# Patient Record
Sex: Female | Born: 1950 | Hispanic: Refuse to answer | Marital: Married | Smoking: Former smoker
Health system: Southern US, Community
[De-identification: ages and names within clinical notes are randomized; demographics above are authoritative.]

## PROBLEM LIST (undated history)

## (undated) DIAGNOSIS — I10 Essential (primary) hypertension: Secondary | ICD-10-CM

## (undated) DIAGNOSIS — Z9889 Other specified postprocedural states: Secondary | ICD-10-CM

## (undated) DIAGNOSIS — M1990 Unspecified osteoarthritis, unspecified site: Secondary | ICD-10-CM

## (undated) DIAGNOSIS — R112 Nausea with vomiting, unspecified: Secondary | ICD-10-CM

## (undated) DIAGNOSIS — Z9221 Personal history of antineoplastic chemotherapy: Secondary | ICD-10-CM

## (undated) DIAGNOSIS — Z803 Family history of malignant neoplasm of breast: Secondary | ICD-10-CM

## (undated) HISTORY — DX: Family history of malignant neoplasm of breast: Z80.3

## (undated) HISTORY — PX: APPENDECTOMY: SHX54

## (undated) HISTORY — DX: Unspecified osteoarthritis, unspecified site: M19.90

## (undated) HISTORY — PX: MASTECTOMY: SHX3

## (undated) HISTORY — PX: TONSILLECTOMY: SUR1361

---

## 2019-05-06 ENCOUNTER — Other Ambulatory Visit: Payer: Self-pay | Admitting: Family Medicine

## 2019-05-06 DIAGNOSIS — N6310 Unspecified lump in the right breast, unspecified quadrant: Secondary | ICD-10-CM

## 2019-05-15 ENCOUNTER — Other Ambulatory Visit: Payer: Self-pay

## 2019-05-15 ENCOUNTER — Ambulatory Visit
Admission: RE | Admit: 2019-05-15 | Discharge: 2019-05-15 | Disposition: A | Discharge status: Home / Self Care | Payer: Medicare Other | Source: Ambulatory Visit | Attending: Family Medicine | Admitting: Family Medicine

## 2019-05-15 ENCOUNTER — Other Ambulatory Visit: Payer: Self-pay | Admitting: Family Medicine

## 2019-05-15 DIAGNOSIS — N6310 Unspecified lump in the right breast, unspecified quadrant: Secondary | ICD-10-CM

## 2019-05-15 HISTORY — DX: Personal history of antineoplastic chemotherapy: Z92.21

## 2019-05-19 ENCOUNTER — Ambulatory Visit
Admission: RE | Admit: 2019-05-19 | Discharge: 2019-05-19 | Disposition: A | Discharge status: Home / Self Care | Payer: Medicare Other | Source: Ambulatory Visit | Attending: Family Medicine | Admitting: Family Medicine

## 2019-05-19 ENCOUNTER — Other Ambulatory Visit: Payer: Self-pay

## 2019-05-19 DIAGNOSIS — N6310 Unspecified lump in the right breast, unspecified quadrant: Secondary | ICD-10-CM

## 2019-05-20 ENCOUNTER — Encounter: Payer: Self-pay | Admitting: *Deleted

## 2019-05-21 ENCOUNTER — Telehealth: Payer: Self-pay | Admitting: Hematology and Oncology

## 2019-05-21 NOTE — Telephone Encounter (Signed)
Spoke with patient to confirm afternoon [facility] appointment for 7/1, packet emailed to patient

## 2019-05-22 ENCOUNTER — Encounter: Payer: Self-pay | Admitting: *Deleted

## 2019-05-22 ENCOUNTER — Other Ambulatory Visit: Payer: Self-pay | Admitting: *Deleted

## 2019-05-22 DIAGNOSIS — Z170 Estrogen receptor positive status [ER+]: Secondary | ICD-10-CM | POA: Insufficient documentation

## 2019-05-22 DIAGNOSIS — C50411 Malignant neoplasm of upper-outer quadrant of right female breast: Secondary | ICD-10-CM | POA: Insufficient documentation

## 2019-05-26 NOTE — Progress Notes (Signed)
[place] NOTE  Patient Care Team: Earney Mallet, MD as PCP - General (Family Medicine) [name], RN as Oncology Nurse Navigator Rockwell Germany, RN as Oncology Nurse Navigator [name], MD as Consulting Physician (Hematology and Oncology) Autumn Messing III, MD as Consulting Physician (General Surgery) [name], MD as Attending Physician (Radiation Oncology)  CHIEF COMPLAINTS/PURPOSE OF CONSULTATION:  Newly diagnosed breast cancer  HISTORY OF PRESENTING ILLNESS:  Marisa Spears 68 y.o. female is here because of recent diagnosis of invasive ductal carcinoma of the right breast. The cancer was detected on a diagnostic mammogram after the patient palpated a lump in the UOQ of the right breast. It showed a 2.2cm mass in th right breast and there was no evidence of right axillary adenopathy. Biopsy on 05/19/19 showed grade 3 invasive ductal carcinoma, HER2 positive by FISH, ER 75% weak staining intensity, PR negative, Ki67 80%. She has a history of left breast cancer 28 years ago treated with mastectomy. She was recently being followed for likely benign calcifications in the right breast. She presents to the clinic today for initial evaluation and discussion of treatment options.   I reviewed her records extensively and collaborated the history with the patient.  SUMMARY OF ONCOLOGIC HISTORY: Oncology History  Malignant neoplasm of upper-outer quadrant of right breast in female, estrogen receptor positive ([place])  1992 Miscellaneous   Left mastectomy followed by adjuvant chemotherapy   05/22/2019 Initial Diagnosis   Patient palpated a lump in the right breast. Mammogram showed 2.2cm mass in the UOQ, with no right axillary adenopathy. 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    05/27/2019 Cancer Staging   Staging form: Breast, AJCC 8th Edition - Clinical stage from 05/27/2019: Stage IIA (cT2, cN0, cM0,  G3, ER+, PR-, HER2+) - Signed by [name], MD on 05/27/2019     MEDICAL HISTORY:  Past Medical History:  Diagnosis Date  . Arthritis   . Personal history of chemotherapy     SURGICAL HISTORY: Left mastectomy  SOCIAL HISTORY: Social History   Socioeconomic History  . Marital status: Married    Spouse name: Not on file  . Number of children: Not on file  . Years of education: Not on file  . Highest education level: Not on file  Occupational History  . Not on file  Social Needs  . Financial resource strain: Not on file  . Food insecurity    Worry: Not on file    Inability: Not on file  . Transportation needs    Medical: Not on file    Non-medical: Not on file  Tobacco Use  . Smoking status: Former Smoker    Types: Cigarettes  . Smokeless tobacco: Never Used  Substance and Sexual Activity  . Alcohol use: Not Currently  . Drug use: Not Currently  . Sexual activity: Not on file  Lifestyle  . Physical activity    Days per week: Not on file    Minutes per session: Not on file  . Stress: Not on file  Relationships  . Social Herbalist on phone: Not on file    Gets together: Not on file    Attends religious service: Not on file    Active member of club or organization: Not on file    Attends meetings of clubs or organizations: Not on file    Relationship status: Not on file  . Intimate partner  violence    Fear of current or ex partner: Not on file    Emotionally abused: Not on file    Physically abused: Not on file    Forced sexual activity: Not on file  Other Topics Concern  . Not on file  Social History Narrative  . Not on file    FAMILY HISTORY: Sister had breast cancer ALLERGIES:  is allergic to codeine.  MEDICATIONS:  Current Outpatient Medications  Medication Sig Dispense Refill  . aspirin EC 81 MG tablet Take 81 mg by mouth daily.    . Calcium Carbonate (CALCIUM 600 PO) Take 600 mcg by mouth daily.    . Omega-3 Fatty Acids (FISH OIL  OMEGA-3 PO) Take by mouth 2 (two) times a day.    . pravastatin (PRAVACHOL) 40 MG tablet Take 40 mg by mouth daily.    Marland Kitchen triamterene-hydrochlorothiazide (DYAZIDE) 37.5-25 MG capsule Take 1 capsule by mouth daily.    Marland Kitchen UNABLE TO FIND Take 2,500 mcg by mouth 2 (two) times a day. Hair, Skin, and Nail Gummies    . VITAMIN D PO Take 50 mcg by mouth daily. 2 tablets once daily     No current facility-administered medications for this visit.     REVIEW OF SYSTEMS:   Constitutional: Denies fevers, chills or abnormal night sweats Eyes: Denies blurriness of vision, double vision or watery eyes Ears, nose, mouth, throat, and face: Denies mucositis or sore throat Respiratory: Denies cough, dyspnea or wheezes Cardiovascular: Denies palpitation, chest discomfort or lower extremity swelling Gastrointestinal:  Denies nausea, heartburn or change in bowel habits Skin: Denies abnormal skin rashes Lymphatics: Denies new lymphadenopathy or easy bruising Neurological:Denies numbness, tingling or new weaknesses Behavioral/Psych: Mood is stable, no new changes  Breast: Denies any palpable lumps or discharge All other systems were reviewed with the patient and are negative.  PHYSICAL EXAMINATION: ECOG PERFORMANCE STATUS: 1 - Symptomatic but completely ambulatory  Vitals:   05/27/19 1306  BP: 125/72  Pulse: (!) 56  Resp: 18  Temp: 98.9 F (37.2 C)  SpO2: 100%   There were no vitals filed for this visit.  GENERAL:alert, no distress and comfortable SKIN: skin color, texture, turgor are normal, no rashes or significant lesions EYES: normal, conjunctiva are pink and non-injected, sclera clear OROPHARYNX:no exudate, no erythema and lips, buccal mucosa, and tongue normal  NECK: supple, thyroid normal size, non-tender, without nodularity LYMPH:  no palpable lymphadenopathy in the cervical, axillary or inguinal LUNGS: clear to auscultation and percussion with normal breathing effort HEART: regular rate &  rhythm and no murmurs and no lower extremity edema ABDOMEN:abdomen soft, non-tender and normal bowel sounds Musculoskeletal:no cyanosis of digits and no clubbing  PSYCH: alert & oriented x 3 with fluent speech NEURO: no focal motor/sensory deficits BREAST: Palpable nodule in the right breast, left mastectomy. No palpable axillary or supraclavicular lymphadenopathy (exam performed in the presence of a chaperone)   LABORATORY DATA:  I have reviewed the data as listed Lab Results  Component Value Date   WBC 6.8 05/27/2019   HGB 13.2 05/27/2019   HCT 38.5 05/27/2019   MCV 91.0 05/27/2019   PLT 245 05/27/2019   Lab Results  Component Value Date   NA 138 05/27/2019   K 3.8 05/27/2019   CL 105 05/27/2019   CO2 24 05/27/2019    RADIOGRAPHIC STUDIES: I have personally reviewed the radiological reports and agreed with the findings in the report.  ASSESSMENT AND PLAN:  Malignant neoplasm of upper-outer quadrant of  right breast in female, estrogen receptor positive (Marisa Spears) 05/22/2019:Patient palpated a lump in the right breast. Mammogram showed 2.2cm mass in the UOQ, with no right axillary adenopathy. 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  Stage IIa  Pathology and radiology counseling: Discussed with the patient, the details of pathology including the type of breast cancer,the clinical staging, the significance of ER, PR and HER-2/neu receptors and the implications for treatment. After reviewing the pathology in detail, we proceeded to discuss the different treatment options between surgery, radiation, chemotherapy, antiestrogen therapies.  Treatment plan: 1.  Surgery with right mastectomy/lumpectomy and sentinel lymph node biopsy 2.  Adjuvant chemotherapy with [place] followed by Herceptin Perjeta maintenance (we also discussed neoadjuvant chemotherapy risks and benefits) Adjuvant radiation if she undergoes lumpectomy 3.   Adjuvant antiestrogen therapy with anastrozole 1 mg daily x7 years  Chemotherapy Counseling: I discussed the risks and benefits of chemotherapy including the risks of nausea/ vomiting, risk of infection from low WBC count, fatigue due to chemo or anemia, bruising or bleeding due to low platelets, mouth sores, loss/ change in taste and decreased appetite. Liver and kidney function will be monitored through out chemotherapy as abnormalities in liver and kidney function may be a side effect of treatment. Cardiac dysfunction due to Herceptin and Perjeta were discussed in detail. Risk of permanent bone marrow dysfunction and leukemia due to chemo were also discussed.  Genetic counseling will also be requested  Return to clinic after surgery to discuss pathology report with Doximity video call If she decides on doing neoadjuvant chemo then we will need port placement MRI breast and echocardiogram to be done immediately.   All questions were answered. The patient knows to call the clinic with any problems, questions or concerns.   Rulon Eisenmenger, MD 05/27/2019    I, Molly Dorshimer, am acting as scribe for [name], MD.  I have reviewed the above documentation for accuracy and completeness, and I agree with the above.

## 2019-05-27 ENCOUNTER — Encounter: Payer: Self-pay | Admitting: Radiation Oncology

## 2019-05-27 ENCOUNTER — Other Ambulatory Visit: Payer: Self-pay

## 2019-05-27 ENCOUNTER — Other Ambulatory Visit: Payer: Self-pay | Admitting: *Deleted

## 2019-05-27 ENCOUNTER — Inpatient Hospital Stay: Payer: Medicare Other

## 2019-05-27 ENCOUNTER — Ambulatory Visit
Admission: RE | Admit: 2019-05-27 | Discharge: 2019-05-27 | Disposition: A | Discharge status: Home / Self Care | Payer: Medicare Other | Source: Ambulatory Visit | Attending: Radiation Oncology | Admitting: Radiation Oncology

## 2019-05-27 ENCOUNTER — Inpatient Hospital Stay: Payer: Medicare Other | Attending: Hematology and Oncology | Admitting: Hematology and Oncology

## 2019-05-27 ENCOUNTER — Ambulatory Visit: Payer: Medicare Other | Attending: General Surgery | Admitting: Physical Therapy

## 2019-05-27 ENCOUNTER — Encounter: Payer: Self-pay | Admitting: Physical Therapy

## 2019-05-27 ENCOUNTER — Ambulatory Visit (HOSPITAL_COMMUNITY)
Admission: RE | Admit: 2019-05-27 | Discharge: 2019-05-27 | Disposition: A | Discharge status: Home / Self Care | Payer: Medicare Other | Source: Ambulatory Visit | Attending: General Surgery | Admitting: General Surgery

## 2019-05-27 ENCOUNTER — Encounter: Payer: Self-pay | Admitting: Licensed Clinical Social Worker

## 2019-05-27 ENCOUNTER — Encounter: Payer: Self-pay | Admitting: Hematology and Oncology

## 2019-05-27 ENCOUNTER — Ambulatory Visit: Payer: Medicare Other | Admitting: Licensed Clinical Social Worker

## 2019-05-27 DIAGNOSIS — Z95828 Presence of other vascular implants and grafts: Secondary | ICD-10-CM

## 2019-05-27 DIAGNOSIS — Z9012 Acquired absence of left breast and nipple: Secondary | ICD-10-CM | POA: Insufficient documentation

## 2019-05-27 DIAGNOSIS — Z79899 Other long term (current) drug therapy: Secondary | ICD-10-CM | POA: Diagnosis not present

## 2019-05-27 DIAGNOSIS — M1990 Unspecified osteoarthritis, unspecified site: Secondary | ICD-10-CM | POA: Diagnosis not present

## 2019-05-27 DIAGNOSIS — Z170 Estrogen receptor positive status [ER+]: Secondary | ICD-10-CM

## 2019-05-27 DIAGNOSIS — C50411 Malignant neoplasm of upper-outer quadrant of right female breast: Secondary | ICD-10-CM

## 2019-05-27 DIAGNOSIS — Z87891 Personal history of nicotine dependence: Secondary | ICD-10-CM | POA: Insufficient documentation

## 2019-05-27 DIAGNOSIS — Z853 Personal history of malignant neoplasm of breast: Secondary | ICD-10-CM | POA: Diagnosis not present

## 2019-05-27 DIAGNOSIS — Z885 Allergy status to narcotic agent status: Secondary | ICD-10-CM | POA: Diagnosis not present

## 2019-05-27 DIAGNOSIS — R531 Weakness: Secondary | ICD-10-CM | POA: Insufficient documentation

## 2019-05-27 DIAGNOSIS — Z803 Family history of malignant neoplasm of breast: Secondary | ICD-10-CM

## 2019-05-27 DIAGNOSIS — Z9189 Other specified personal risk factors, not elsewhere classified: Secondary | ICD-10-CM | POA: Insufficient documentation

## 2019-05-27 DIAGNOSIS — R293 Abnormal posture: Secondary | ICD-10-CM | POA: Diagnosis present

## 2019-05-27 LAB — CBC WITH DIFFERENTIAL (CANCER CENTER ONLY)
Abs Immature Granulocytes: 0.02 10*3/uL (ref 0.00–0.07)
Basophils Absolute: 0 10*3/uL (ref 0.0–0.1)
Basophils Relative: 1 %
Eosinophils Absolute: 0.1 10*3/uL (ref 0.0–0.5)
Eosinophils Relative: 2 %
HCT: 38.5 % (ref 36.0–46.0)
Hemoglobin: 13.2 g/dL (ref 12.0–15.0)
Immature Granulocytes: 0 %
Lymphocytes Relative: 23 %
Lymphs Abs: 1.6 10*3/uL (ref 0.7–4.0)
MCH: 31.2 pg (ref 26.0–34.0)
MCHC: 34.3 g/dL (ref 30.0–36.0)
MCV: 91 fL (ref 80.0–100.0)
Monocytes Absolute: 0.4 10*3/uL (ref 0.1–1.0)
Monocytes Relative: 6 %
Neutro Abs: 4.6 10*3/uL (ref 1.7–7.7)
Neutrophils Relative %: 68 %
Platelet Count: 245 10*3/uL (ref 150–400)
RBC: 4.23 MIL/uL (ref 3.87–5.11)
RDW: 12.8 % (ref 11.5–15.5)
WBC Count: 6.8 10*3/uL (ref 4.0–10.5)
nRBC: 0 % (ref 0.0–0.2)

## 2019-05-27 LAB — CMP (CANCER CENTER ONLY)
ALT: 11 U/L (ref 0–44)
AST: 15 U/L (ref 15–41)
Albumin: 4.1 g/dL (ref 3.5–5.0)
Alkaline Phosphatase: 64 U/L (ref 38–126)
Anion gap: 9 (ref 5–15)
BUN: 13 mg/dL (ref 8–23)
CO2: 24 mmol/L (ref 22–32)
Calcium: 9.6 mg/dL (ref 8.9–10.3)
Chloride: 105 mmol/L (ref 98–111)
Creatinine: 0.89 mg/dL (ref 0.44–1.00)
GFR, Est AFR Am: >60 mL/min (ref >60)
GFR, Estimated: >60 mL/min (ref >60)
Glucose, Bld: 123 mg/dL — ABNORMAL HIGH (ref 70–99)
Potassium: 3.8 mmol/L (ref 3.5–5.1)
Sodium: 138 mmol/L (ref 135–145)
Total Bilirubin: 1 mg/dL (ref 0.3–1.2)
Total Protein: 7.3 g/dL (ref 6.5–8.1)

## 2019-05-27 NOTE — Therapy (Signed)
[place], [place], [address] Phone: [phone]   Fax:  [phone]  Physical Therapy Evaluation  Patient Details  Name: Marisa Spears MRN: [id number] Date of Birth: 1951/08/03 Referring Provider (PT): Dr. Autumn Messing   Encounter Date: 05/27/2019  PT End of Session - 05/27/19 1732    Visit Number  1    Number of Visits  2    Date for PT Re-Evaluation  07/22/19    PT Start Time  1430    PT Stop Time  1453    PT Time Calculation (min)  23 min    Activity Tolerance  Patient tolerated treatment well    Behavior During Therapy  [facility] for tasks assessed/performed       Past Medical History:  Diagnosis Date  . Arthritis   . Family history of breast cancer   . Personal history of chemotherapy     Past Surgical History:  Procedure Laterality Date  . APPENDECTOMY    . MASTECTOMY Left   . TONSILLECTOMY      There were no vitals filed for this visit.   Subjective Assessment - 05/27/19 1721    Subjective  Patient reports she is here today to be seen by her medical team for her newly diagnosed right breast cancer.    Pertinent History  Patient was diagnosed on 05/15/2019 with right invasive ductal carcinoma breast cancer. It measures 2.2 cm and is located in the upper outer quadrant. It is ER positive, PR negative, and HER2 positive with a Ki67 of 80%. She has a history of a left mastectomy and axillary node dissection (33 nodes removed) in 1992 followed by chemotherapy.    Limitations  Sitting    Patient Stated Goals  Reduce lymphedema risk and learn post op shoulder ROM HEP    Currently in Pain?  Yes    Pain Score  4     Pain Location  Other (Comment)   Thumbs   Pain Orientation  Right;Left    Pain Descriptors / Indicators  Aching    Pain Type  Chronic pain    Pain Onset  More than a month ago    Pain Frequency  Intermittent    Aggravating Factors   Using thumbs    Pain Relieving Factors  Rest          [facility] PT Assessment - 05/27/19 0001      Assessment   Medical Diagnosis  Right breast cancer    Referring Provider (PT)  Dr. Autumn Messing    Onset Date/Surgical Date  05/15/19    Hand Dominance  Right    Prior Therapy  none      Precautions   Precautions  Other (comment)    Precaution Comments  active cancer; high risk LEFT arm lymphedema      Restrictions   Weight Bearing Restrictions  No      Balance Screen   Has the patient fallen in the past 6 months  No    Has the patient had a decrease in activity level because of a fear of falling?   No    Is the patient reluctant to leave their home because of a fear of falling?   No      Home Film/video editor residence    Living Arrangements  Spouse/significant other;Other relatives   Husband and 63 y.o. grandson   Available Help at Discharge  Family  Prior Function   Level of Independence  Independent    Vocation  Retired    Leisure  She does not exercise      Cognition   Overall Cognitive Status  Within Functional Limits for tasks assessed      Posture/Postural Control   Posture/Postural Control  Postural limitations    Postural Limitations  Rounded Shoulders;Forward head;Increased thoracic kyphosis      ROM / Strength   AROM / PROM / Strength  AROM;Strength      AROM   Overall AROM Comments  Cervical AROM is [facility]    AROM Assessment Site  Shoulder    Right/Left Shoulder  Right;Left    Right Shoulder Extension  45 Degrees    Right Shoulder Flexion  152 Degrees    Right Shoulder ABduction  165 Degrees    Right Shoulder Internal Rotation  60 Degrees    Right Shoulder External Rotation  76 Degrees    Left Shoulder Extension  47 Degrees    Left Shoulder Flexion  153 Degrees    Left Shoulder ABduction  163 Degrees    Left Shoulder Internal Rotation  67 Degrees    Left Shoulder External Rotation  90 Degrees      Strength   Overall Strength  Within functional limits for tasks performed         LYMPHEDEMA/ONCOLOGY QUESTIONNAIRE - 05/27/19 1730      Type   Cancer Type  Right breast cancer      Lymphedema Assessments   Lymphedema Assessments  Upper extremities      Right Upper Extremity Lymphedema   10 cm Proximal to Olecranon Process  32.7 cm    Olecranon Process  26.4 cm    10 cm Proximal to Ulnar Styloid Process  23.5 cm    Just Proximal to Ulnar Styloid Process  16.8 cm    Across Hand at [employer]  19.8 cm    At [place] of 2nd Digit  6.7 cm      Left Upper Extremity Lymphedema   10 cm Proximal to Olecranon Process  32.3 cm    Olecranon Process  26.8 cm    10 cm Proximal to Ulnar Styloid Process  23.7 cm    Just Proximal to Ulnar Styloid Process  17.4 cm    Across Hand at [employer]  19.3 cm    At [place] of 2nd Digit  6.6 cm             Objective measurements completed on examination: See above findings.     Patient was instructed today in a home exercise program today for post op shoulder range of motion. These included active assist shoulder flexion in sitting, scapular retraction, wall walking with shoulder abduction, and hands behind head external rotation.  She was encouraged to do these twice a day, holding 3 seconds and repeating 5 times when permitted by her physician.       PT Education - 05/27/19 1731    Education Details  Lymphedema risk reduction and post op shoulder ROM HEP    Person(s) Educated  Patient    Methods  Explanation;Demonstration;Handout    Comprehension  Returned demonstration;Verbalized understanding          PT Long Term Goals - 05/27/19 1737      PT LONG TERM GOAL #1   Title  Patient will demonstrate she has regained full shoulder ROM and function post operatively compared to baseline.    Time  [facility] Goals - 05/27/19 1737      Patient will be able to verbalize understanding of pertinent lymphedema risk reduction practices relevant to her diagnosis  specifically related to skin care.   Time  1    Period  Days    Status  Achieved      Patient will be able to return demonstrate and/or verbalize understanding of the post-op home exercise program related to regaining shoulder range of motion.   Time  1    Period  Days    Status  Achieved      Patient will be able to verbalize understanding of the importance of attending the postoperative After Breast Cancer Class for further lymphedema risk reduction education and therapeutic exercise.   Time  1    Period  Days    Status  Achieved            Plan - 05/27/19 1732    Clinical Impression Statement  Patient was diagnosed on 05/15/2019 with right invasive ductal carcinoma breast cancer. It measures 2.2 cm and is located in the upper outer quadrant. It is ER positive, PR negative, and HER2 positive with a Ki67 of 80%. She has a history of a left mastectomy and axillary node dissection (33 nodes removed) in 1992 followed by chemotherapy. Her multidisciplinary medical team met prior to her assessments to determine a recommended treatment plan. She is planning to have a right mastectomy and sentinel node biopsy followed by anti-estrogen therapy. She will beneift from post op PT to regain shoulder function and ROM.    Stability/Clinical Decision Making  Stable/Uncomplicated    Clinical Decision Making  Low    Rehab Potential  Excellent    PT Frequency  --   Eval and 1 f/u visit   PT Treatment/Interventions  ADLs/Self Care Home Management;Therapeutic exercise;Patient/family education    PT Next Visit Plan  Will reasses patient 3-4 weeks post op to determine needs    PT Home Exercise Plan  Post op shoulder ROM HEP    Consulted and Agree with Plan of Care  Patient       Patient will benefit from skilled therapeutic intervention in order to improve the following deficits and impairments:  Pain, Decreased knowledge of precautions, Impaired UE functional use, Decreased range of motion, Postural  dysfunction  Visit Diagnosis: 1. Malignant neoplasm of upper-outer quadrant of right breast in female, estrogen receptor positive ([place])   2. Abnormal posture   3. History of left breast cancer   4. At risk for lymphedema    Patient will follow up at outpatient cancer rehab 3-4 weeks following surgery.  If the patient requires physical therapy at that time, a specific plan will be dictated and sent to the referring physician for approval. The patient was educated today on appropriate basic range of motion exercises to begin post operatively and the importance of attending the After Breast Cancer class following surgery.  Patient was educated today on lymphedema risk reduction practices as it pertains to recommendations that will benefit the patient immediately following surgery.  She verbalized good understanding.       Problem List Patient Active Problem List   Diagnosis Date Noted  . Family history of breast cancer   . Malignant neoplasm of upper-outer quadrant of right breast in female, estrogen receptor positive ([place]) 05/22/2019   Annia Friendly, PT 05/27/19 5:39  PM  [place], [place], [address] Phone: [phone]   Fax:  [phone]  Name: Marisa Spears MRN: [id number] Date of Birth: 11-26-1951

## 2019-05-27 NOTE — Patient Instructions (Signed)

## 2019-05-27 NOTE — Assessment & Plan Note (Signed)
05/22/2019:Patient palpated a lump in the right breast. Mammogram showed 2.2cm mass in the UOQ, with no right axillary adenopathy. 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  Stage IIa  Pathology and radiology counseling: Discussed with the patient, the details of pathology including the type of breast cancer,the clinical staging, the significance of ER, PR and HER-2/neu receptors and the implications for treatment. After reviewing the pathology in detail, we proceeded to discuss the different treatment options between surgery, radiation, chemotherapy, antiestrogen therapies.  Treatment plan: 1.  Surgery with right mastectomy and sentinel lymph node biopsy 2.  Adjuvant chemotherapy with [place] followed by Herceptin Perjeta maintenance 3.  Adjuvant antiestrogen therapy with anastrozole 1 mg daily x7 years  Return to clinic after surgery to discuss pathology report with Doximity video call

## 2019-05-27 NOTE — Progress Notes (Addendum)
Radiation Oncology         [phone] ________________________________  Initial outpatient Consultation  Name: Marisa Spears MRN: [id number]  Date: 05/27/2019  DOB: 08-Aug-1951  YB:[id number], Marisa Main, MD  Marisa Kussmaul, MD   REFERRING PHYSICIAN: Autumn Messing III, MD  DIAGNOSIS:    ICD-10-CM   1. Malignant neoplasm of upper-outer quadrant of right breast in female, estrogen receptor positive ([place])  C50.411    Z17.0   Cancer Staging Malignant neoplasm of upper-outer quadrant of right breast in female, estrogen receptor positive ([place]) Staging form: Breast, AJCC 8th Edition - Clinical stage from 05/27/2019: Stage IIA (cT2, cN0, cM0, G3, ER+, PR-, HER2+) - Signed by [name], MD on 05/27/2019   CHIEF COMPLAINT: Here to discuss management of right breast cancer  HISTORY OF PRESENT ILLNESS::Marisa Spears is a 68 y.o. female who has a past medical history notable for left mastectomy and chemotherapy for breast cancer diagnosed approximately 28 years ago.  She did not receive radiotherapy.  She was in her usual state of health when she presented with palpable right breast mass, and then breast abnormality on the following imaging:mammogram.   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  ER status: 75% weakly positive; PR status neg, Her2 status positive; Grade 3.  [place]  She and her husband care around the clock for their 528year-old grandchild.   PREVIOUS RADIATION THERAPY: No  PAST MEDICAL HISTORY:  has a past medical history of Arthritis, Family history of breast cancer, and Personal history of chemotherapy.    PAST SURGICAL HISTORY: Past Surgical History:  Procedure Laterality Date   APPENDECTOMY     MASTECTOMY Left    TONSILLECTOMY      FAMILY HISTORY: family history includes Breast cancer in  her sister.  SOCIAL HISTORY:  reports that she has quit smoking. Her smoking use included cigarettes. She has never used smokeless tobacco. She reports previous alcohol use. She reports previous drug use.  ALLERGIES: Codeine  MEDICATIONS:  Current Outpatient Medications  Medication Sig Dispense Refill   aspirin EC 81 MG tablet Take 81 mg by mouth daily.     Calcium Carbonate (CALCIUM 600 PO) Take 600 mcg by mouth daily.     Omega-3 Fatty Acids (FISH OIL OMEGA-3 PO) Take by mouth 2 (two) times a day.     pravastatin (PRAVACHOL) 40 MG tablet Take 40 mg by mouth daily.     triamterene-hydrochlorothiazide (DYAZIDE) 37.5-25 MG capsule Take 1 capsule by mouth daily.     UNABLE TO FIND Take 2,500 mcg by mouth 2 (two) times a day. Hair, Skin, and Nail Gummies     VITAMIN D PO Take 50 mcg by mouth daily. 2 tablets once daily     No current facility-administered medications for this encounter.     REVIEW OF SYSTEMS: As above  PHYSICAL EXAM:  Vitals:   05/27/19 1306  BP: 125/72  Pulse: (!) 56  Resp: 18  Temp: 98.9 F (37.2 C)  SpO2: 100%    General: Alert and oriented, in no acute distress  Psychiatric: Judgment and insight are intact. Affect is appropriate. Breasts: Status post left mastectomy.  There is a right breast mass in the upper outer quadrant that measures approximately 3 cm to palpation. No other palpable masses appreciated in the breast or axillae or  left chest wall   ECOG = 0  0 - Asymptomatic (Fully active, able to carry on all predisease activities without restriction)  1 - Symptomatic but completely ambulatory (Restricted in physically strenuous activity but ambulatory and able to carry out work of a light or sedentary nature. For example, light housework, office work)  2 - Symptomatic, <50% in bed during the day (Ambulatory and capable of all self care but unable to carry out any work activities. Up and about more than 50% of waking hours)  3 - Symptomatic,  >50% in bed, but not bedbound (Capable of only limited self-care, confined to bed or chair 50% or more of waking hours)  4 - Bedbound (Completely disabled. Cannot carry on any self-care. Totally confined to bed or chair)  5 - Death   Eustace Pen MM, Creech RH, Tormey DC, et al. [phone]). "Toxicity and response criteria of the [facility]". [place] Oncol. 5 (6): 649-55   LABORATORY DATA:  Lab Results  Component Value Date   WBC 6.8 05/27/2019   HGB 13.2 05/27/2019   HCT 38.5 05/27/2019   MCV 91.0 05/27/2019   PLT 245 05/27/2019   CMP     Component Value Date/Time   NA 138 05/27/2019 1235   K 3.8 05/27/2019 1235   CL 105 05/27/2019 1235   CO2 24 05/27/2019 1235   GLUCOSE 123 (H) 05/27/2019 1235   BUN 13 05/27/2019 1235   CREATININE 0.89 05/27/2019 1235   CALCIUM 9.6 05/27/2019 1235   PROT 7.3 05/27/2019 1235   ALBUMIN 4.1 05/27/2019 1235   AST 15 05/27/2019 1235   ALT 11 05/27/2019 1235   ALKPHOS 64 05/27/2019 1235   BILITOT 1.0 05/27/2019 1235   GFRNONAA >60 05/27/2019 1235   GFRAA >60 05/27/2019 1235         RADIOGRAPHY: US Breast Ltd Uni Right Inc Axilla  Result Date: 05/15/2019 CLINICAL DATA:  68 year old female with history left breast cancer status post mastectomy about 28 years ago. She presents for evaluation of a palpable lump in the upper-outer quadrant of the right breast. She has been seen at an outside facility and is being followed for calcifications in the right breast that are thought to be likely benign. EXAM: DIGITAL DIAGNOSTIC RIGHT MAMMOGRAM WITH CAD AND TOMO ULTRASOUND RIGHT BREAST COMPARISON:  Previous exam(s). ACR Breast Density Category c: The breast tissue is heterogeneously dense, which may obscure small masses. FINDINGS: A skin marker has been placed on the upper-outer quadrant of the right breast indicating the palpable site of concern. Deep to this marker, there is an obscured mass measuring approximately 1.9 cm. Spot  magnification images over the area were performed given that she had history of prior calcifications in this region. Only one definite residual calcification is seen on these images. No other suspicious calcifications, masses or areas of distortion are seen in the right breast. The patient has had a left breast mastectomy. Mammographic images were processed with CAD. On physical exam, there is a very firm protruding palpable mass in the upper-outer quadrant of the right breast. Targeted ultrasound is performed, showing an irregular hypoechoic mass with indistinct margins measuring 2.2 x 1.4 x 1.3 cm. Blood flow seen within the mass on color Doppler imaging. Ultrasound of the right axilla demonstrates multiple normal-appearing lymph nodes. IMPRESSION: 1. There is a highly suspicious mass at the palpable site in the upper-outer right breast. 2.  No evidence of right axillary lymphadenopathy. RECOMMENDATION: Ultrasound guided biopsy is recommended  for the palpable right breast mass. This has been scheduled for 05/19/2019 at 2:45 p.m. I have discussed the findings and recommendations with the patient. Results were also provided in writing at the conclusion of the visit. If applicable, a reminder letter will be sent to the patient regarding the next appointment. BI-RADS CATEGORY  5: Highly suggestive of malignancy. Electronically Signed   By: Ammie Ferrier M.D.   On: 05/15/2019 16:26   Mm Diag Breast Tomo Uni Right  Result Date: 05/15/2019 CLINICAL DATA:  68 year old female with history left breast cancer status post mastectomy about 28 years ago. She presents for evaluation of a palpable lump in the upper-outer quadrant of the right breast. She has been seen at an outside facility and is being followed for calcifications in the right breast that are thought to be likely benign. EXAM: DIGITAL DIAGNOSTIC RIGHT MAMMOGRAM WITH CAD AND TOMO ULTRASOUND RIGHT BREAST COMPARISON:  Previous exam(s). ACR Breast Density  Category c: The breast tissue is heterogeneously dense, which may obscure small masses. FINDINGS: A skin marker has been placed on the upper-outer quadrant of the right breast indicating the palpable site of concern. Deep to this marker, there is an obscured mass measuring approximately 1.9 cm. Spot magnification images over the area were performed given that she had history of prior calcifications in this region. Only one definite residual calcification is seen on these images. No other suspicious calcifications, masses or areas of distortion are seen in the right breast. The patient has had a left breast mastectomy. Mammographic images were processed with CAD. On physical exam, there is a very firm protruding palpable mass in the upper-outer quadrant of the right breast. Targeted ultrasound is performed, showing an irregular hypoechoic mass with indistinct margins measuring 2.2 x 1.4 x 1.3 cm. Blood flow seen within the mass on color Doppler imaging. Ultrasound of the right axilla demonstrates multiple normal-appearing lymph nodes. IMPRESSION: 1. There is a highly suspicious mass at the palpable site in the upper-outer right breast. 2.  No evidence of right axillary lymphadenopathy. RECOMMENDATION: Ultrasound guided biopsy is recommended for the palpable right breast mass. This has been scheduled for 05/19/2019 at 2:45 p.m. I have discussed the findings and recommendations with the patient. Results were also provided in writing at the conclusion of the visit. If applicable, a reminder letter will be sent to the patient regarding the next appointment. BI-RADS CATEGORY  5: Highly suggestive of malignancy. Electronically Signed   By: Ammie Ferrier M.D.   On: 05/15/2019 16:26   Mm Clip Placement Right  Result Date: 05/19/2019 CLINICAL DATA:  Confirmation of clip placement after ultrasound-guided core needle biopsy of a highly suspicious mass involving the UPPER OUTER QUADRANT of the RIGHT breast. EXAM: 3D  TOMOSYNTHESIS DIAGNOSTIC RIGHT MAMMOGRAM POST ULTRASOUND BIOPSY COMPARISON:  Previous exam(s). FINDINGS: 3D tomosynthesis images were obtained following ultrasound guided biopsy of a highly suspicious mass involving the UPPER OUTER QUADRANT of the RIGHT breast. The ribbon shaped tissue marker clip is appropriately positioned within the biopsied mass. Expected post biopsy changes are present without evidence of hematoma. The port from the indwelling Port-A-Cath overlies the low RIGHT axilla on the MLO view. IMPRESSION: Appropriate positioning of the ribbon shaped tissue marker clip within the biopsied mass in the [place] of the RIGHT breast. Final Assessment: Post Procedure Mammograms for Marker Placement Electronically Signed   By: Evangeline Dakin M.D.   On: 05/19/2019 15:40   [place] Rt Breast Bx W Loc Dev 1st Lesion  Img Bx Spec US Guide  Addendum Date: 05/20/2019   ADDENDUM REPORT: 05/20/2019 13:27 ADDENDUM: Pathology revealed GRADE III INVASIVE DUCTAL CARCINOMA of the Right breast, upper outer quadrant, 10 o'clock 7 cm from nipple. This was found to be concordant by Dr. Peggye Fothergill. Pathology results were discussed with the patient by telephone. The patient reported doing well after the biopsy with tenderness at the site. Post biopsy instructions and care were reviewed and questions were answered. The patient was encouraged to call [facility] for any additional concerns. The patient was referred to [facility] on May 27, 2019. Pathology results reported by Terie Purser, RN on 05/20/2019. Electronically Signed   By: Evangeline Dakin M.D.   On: 05/20/2019 13:27   Result Date: 05/20/2019 CLINICAL DATA:  68 year old with a palpable highly suspicious approximate 2.2 cm mass involving the UPPER OUTER QUADRANT of the RIGHT breast at the 10 o'clock position approximately 7 cm from the nipple. Personal  history of malignant LEFT mastectomy approximately 28 years ago. EXAM: ULTRASOUND GUIDED RIGHT BREAST CORE NEEDLE BIOPSY COMPARISON:  Diagnostic mammogram and RIGHT breast ultrasound 05/15/2019 from the [facility]. All other prior imaging has been performed in [place], [place]. FINDINGS: I met with the patient and we discussed the procedure of ultrasound-guided biopsy, including benefits and alternatives. We discussed the high likelihood of a successful procedure. We discussed the risks of the procedure, including infection, bleeding, tissue injury, clip migration, and inadequate sampling. Informed written consent was given. The usual time-out protocol was performed immediately prior to the procedure. Lesion quadrant: UPPER OUTER QUADRANT. Using sterile technique with chlorhexidine as skin antisepsis, 1% lidocaine and 1% lidocaine with epinephrine as local anesthetic, under direct ultrasound visualization, a 12 gauge Bard Marquee core needle device placed through an 11 gauge introducer needle was used to perform biopsy of the mass involving the UPPER OUTER QUADRANT of the RIGHT breast using a LATERAL approach. At the conclusion of the procedure a ribbon shaped tissue marker clip was deployed into the biopsy cavity. Follow up 2 view mammogram was performed and dictated separately. IMPRESSION: Ultrasound guided biopsy of a highly suspicious 2.2 cm mass involving the UPPER OUTER QUADRANT of the RIGHT breast. No apparent complications. Electronically Signed: By: Evangeline Dakin M.D. On: 05/19/2019 15:33      IMPRESSION/PLAN: right breast cancer  She has been discussed at our multidisciplinary tumor board.  The consensus is that she would be a good candidate for breast conservation. I talked to her about the option of a mastectomy and informed her that her expected overall survival would be equivalent between mastectomy and breast conservation, based upon randomized controlled data.  We also  talked about the option of mastectomy.  She is not sure which surgical option she prefers.  She will speak with the other physicians and also consider genetic counseling which may factor into her decision.    It was a pleasure meeting the patient today. We discussed the risks, benefits, and side effects of radiotherapy in the setting of breast conservation.  She understands there is still a chance that she may need radiotherapy if she pursues a mastectomy as well.  This would depend largely upon her nodal status. We spoke about acute effects including skin irritation and fatigue as well as much less common late effects including internal organ injury or irritation. We spoke about the latest technology that is used to minimize the  risk of late effects for patients undergoing radiotherapy to the breast or chest wall. No guarantees of treatment were given.   Since she lives quite a distance from here we discussed the potential option of receiving her radiotherapy at [facility] in [place].  She will think about this option as well.  Our breast navigators may want to explore this with her after surgery if radiotherapy is indicated, before they make a referral.  __________________________________________   [name], MD

## 2019-05-27 NOTE — Progress Notes (Signed)
REFERRING PROVIDER: [name], MD [address] [place],  [place] [address]  PRIMARY PROVIDER:  Earney Mallet, MD  PRIMARY REASON FOR VISIT:  1. Malignant neoplasm of upper-outer quadrant of right breast in female, estrogen receptor positive ([place])   2. Family history of breast cancer     I connected with Marisa Spears on 05/27/2019 at 3:30 PM EDT by Webex and verified that I am speaking with the correct person using two identifiers.    Patient location: clinic Provider location: clinic  HISTORY OF PRESENT ILLNESS:   Marisa Spears, a 68 y.o. female, was seen for a [place] cancer genetics consultation due to her recent diagnosis of breast cancer and family history of breast cancer.  Marisa Spears presents to clinic today to discuss the possibility of a hereditary predisposition to cancer, genetic testing, and to further clarify her future cancer risks, as well as potential cancer risks for family members.   At the age of 66, Marisa Spears was diagnosed with left breast cancer. This was treated with mastectomy.  In 2020, at the age of 48, Marisa Spears was diagnosed with IDC of the right breast, ER+, PR-, Her2+.  The treatment plan includes surgery, adjuvant chemotherapy and adjuvant antiestrogen therapy.   CANCER HISTORY:  Oncology History  Malignant neoplasm of upper-outer quadrant of right breast in female, estrogen receptor positive ([place])  1992 Miscellaneous   Left mastectomy followed by adjuvant chemotherapy   05/22/2019 Initial Diagnosis   Patient palpated a lump in the right breast. Mammogram showed 2.2cm mass in the UOQ, with no right axillary adenopathy. 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    05/27/2019 Cancer Staging   Staging form: Breast, AJCC 8th Edition - Clinical stage from 05/27/2019: Stage IIA (cT2, cN0, cM0, G3, ER+, PR-, HER2+) - Signed by [name], MD on 05/27/2019      RISK FACTORS:  Menarche was at  age 71.  First live birth at age 32.  Ovaries intact: yes.  Hysterectomy: no.  Menopausal status: postmenopausal.   Past Medical History:  Diagnosis Date  . Arthritis   . Family history of breast cancer   . Personal history of chemotherapy     Past Surgical History:  Procedure Laterality Date  . APPENDECTOMY    . MASTECTOMY Left   . TONSILLECTOMY      Social History   Socioeconomic History  . Marital status: Married    Spouse name: Not on file  . Number of children: Not on file  . Years of education: Not on file  . Highest education level: Not on file  Occupational History  . Not on file  Social Needs  . Financial resource strain: Not on file  . Food insecurity    Worry: Not on file    Inability: Not on file  . Transportation needs    Medical: Not on file    Non-medical: Not on file  Tobacco Use  . Smoking status: Former Smoker    Types: Cigarettes  . Smokeless tobacco: Never Used  Substance and Sexual Activity  . Alcohol use: Not Currently  . Drug use: Not Currently  . Sexual activity: Not on file  Lifestyle  . Physical activity    Days per week: Not on file    Minutes per session: Not on file  . Stress: Not on file  Relationships  . Social connections    Talks on phone: Not on file  Gets together: Not on file    Attends religious service: Not on file    Active member of club or organization: Not on file    Attends meetings of clubs or organizations: Not on file    Relationship status: Not on file  Other Topics Concern  . Not on file  Social History Narrative  . Not on file     FAMILY HISTORY:  We obtained a detailed, 4-generation family history.  Significant diagnoses are listed below: Family History  Problem Relation Age of Onset  . Breast cancer Sister    Marisa Spears has a son and a daughter. She has one full brother living at 70, and one maternal half sister living at 31. This half sister had breast cancer at 31 and patient believes she had  genetic testing that was negative.   Marisa Spears mother passed away at 10, no cancers. The patient had 2 maternal aunts, 1 maternal uncle, no cancers. No known cancers in maternal cousins. No known cancers in maternal grandparents.  Marisa Spears father passed away at 65, no cancers. The patient had 9 paternal uncles, 3 paternal aunts. One of her uncles had a daughter that had breast cancer. No known cancers in paternal grandparents. Marisa Spears is unaware of previous family history of genetic testing for hereditary cancer risks. There is no reported Ashkenazi Jewish ancestry. There is no known consanguinity.  GENETIC COUNSELING ASSESSMENT: Marisa Spears is a 68 y.o. female with a personal and family history which is somewhat suggestive of a hereditary cancer syndrome and predisposition to cancer. We, therefore, discussed and recommended the following at today's visit.   DISCUSSION: We discussed that 5 - 10% of breast cancer is hereditary, with most cases associated with BRCA1/BRCA2.  There are other genes that can be associated with hereditary breast cancer syndromes.  These include PALB2, CHEK2, ATM, etc.    We reviewed the characteristics, features and inheritance patterns of hereditary cancer syndromes. We also discussed genetic testing, including the appropriate family members to test, the process of testing, insurance coverage and turn-around-time for results. We discussed the implications of a negative, positive and/or variant of uncertain significant result. In order to get genetic test results in a timely manner so that Marisa Spears can use these genetic test results for surgical decisions, we recommended Marisa Spears pursue genetic testing for the Breast Cancer STAT Panel. Once complete, we recommend Marisa Spears pursue reflex genetic testing to the Common Hereditary Cancers gene panel.   The STAT Breast cancer panel offered by [facility] includes sequencing and rearrangement analysis for the following 9 genes:   ATM, BRCA1, BRCA2, CDH1, CHEK2, PALB2, PTEN, STK11 and TP53.    The Common Hereditary Cancers Panel offered by [facility] includes sequencing and/or deletion duplication testing of the following 48 genes: APC, ATM, AXIN2, BARD1, BMPR1A, BRCA1, BRCA2, BRIP1, CDH1, CDKN2A (p14ARF), CDKN2A (p16INK4a), CKD4, CHEK2, CTNNA1, DICER1, EPCAM (Deletion/duplication testing only), GREM1 (promoter region deletion/duplication testing only), KIT, MEN1, MLH1, MSH2, MSH3, MSH6, MUTYH, NBN, NF1, NHTL1, PALB2, PDGFRA, PMS2, POLD1, POLE, PTEN, RAD50, RAD51C, RAD51D, RNF43, SDHB, SDHC, SDHD, SMAD4, SMARCA4. STK11, TP53, TSC1, TSC2, and VHL.  The following genes were evaluated for sequence changes only: SDHA and HOXB13 c.251G>A variant only.  Based on Marisa Spears's personal and family history of cancer, she meets medical criteria for genetic testing. Despite that she meets criteria, she may still have an out of pocket cost.   PLAN: After considering the risks, benefits, and limitations, Ms. Hession provided informed  consent to pursue genetic testing and the blood sample was sent to [facility] for analysis of the Breast Cancer STAT Panel + Common Hereditary Cancers Panel. Results should be available within approximately 1 weeks' time, at which point they will be disclosed by telephone to Ms. Womac, as will any additional recommendations warranted by these results. Ms. Mcandrew will receive a summary of her genetic counseling visit and a copy of her results once available. This information will also be available in Epic.   Lastly, we encouraged Ms. Eick to remain in contact with cancer genetics annually so that we can continuously update the family history and inform her of any changes in cancer genetics and testing that may be of benefit for this family.   Ms. Debroux questions were answered to her satisfaction today. Our contact information was provided should additional questions or concerns arise. Thank you for the referral  and allowing [place] to share in the care of your patient.   Faith Rogue, MS Genetic Counselor [email or web address] Phone: [phone]  The patient was seen for a total of 12 minutes in virtual genetic counseling.  Drs. Magrinat, Lindi Adie and/or Burr Medico were available for discussion regarding this case.

## 2019-05-27 NOTE — Addendum Note (Signed)
Encounter addended by: [name], MD on: 05/27/2019 5:23 PM  Actions taken: Clinical Note Signed

## 2019-05-28 ENCOUNTER — Telehealth: Payer: Self-pay | Admitting: *Deleted

## 2019-05-28 NOTE — Telephone Encounter (Signed)
Spoke to pt concerning [place] from 7.1.20. Denies questions or concerns regarding dx. Pt has decided to move forward with Right Mastectomy. Physician team notified. Encourage pt to call with needs. Received verbal understanding.

## 2019-06-01 ENCOUNTER — Encounter: Payer: Self-pay | Admitting: General Practice

## 2019-06-01 NOTE — Progress Notes (Signed)
[facility] Psychosocial Distress Screening Spiritual Care  Met with Marisa Spears by phone following [facility] to introduce [facility] team/resources, reviewing distress screen per protocol.  The patient scored a 7 on the Psychosocial Distress Thermometer which indicates severe distress. Also assessed for distress and other psychosocial needs.   ONCBCN DISTRESS SCREENING 06/01/2019  Screening Type Initial Screening  Distress experienced in past week (1-10) 7  Practical problem type Childcare  Family Problem type Other (comment)  Emotional problem type Nervousness/Anxiety  Spiritual/Religous concerns type Relating to God  Physical Problem type Sleep/insomnia  Referral to support programs Yes    Marisa Spears has many transferrable skills from coping with breast cancer 28y ago. Per pt, her sister had breast cancer 4 years ago and best friend 5 years ago, so she has supporters who are familiar with [name] and team. Per pt, perhaps biggest concern is that she and her husband have custody of there 31yo grandson, so they are actively parenting at ca 70yo. She is eager to get surgery underway!  Follow up needed: No. Per pt, no other needs or concerns at this time. She is aware of ongoing [facility] team/programming availability and plans to reach out as needed/desired.   [place], [place], [facility] Pager [phone] Voicemail [phone]

## 2019-06-05 ENCOUNTER — Encounter: Payer: Self-pay | Admitting: *Deleted

## 2019-06-09 ENCOUNTER — Ambulatory Visit: Payer: Self-pay | Admitting: General Surgery

## 2019-06-09 ENCOUNTER — Telehealth: Payer: Self-pay | Admitting: Licensed Clinical Social Worker

## 2019-06-09 ENCOUNTER — Telehealth: Payer: Self-pay | Admitting: Hematology and Oncology

## 2019-06-09 DIAGNOSIS — Z170 Estrogen receptor positive status [ER+]: Secondary | ICD-10-CM

## 2019-06-09 DIAGNOSIS — C50411 Malignant neoplasm of upper-outer quadrant of right female breast: Secondary | ICD-10-CM

## 2019-06-09 NOTE — Telephone Encounter (Signed)
Scheduled appt per 7/10 sch message - pt is aware of appt date and time.  

## 2019-06-09 NOTE — Telephone Encounter (Signed)
Revealed negative testing on the STAT breast cancer panel (9 genes). Let Marisa Spears know we are still waiting on the remainder of the results to come back and will call her again once we have them.

## 2019-06-15 ENCOUNTER — Encounter: Payer: Self-pay | Admitting: *Deleted

## 2019-06-16 ENCOUNTER — Other Ambulatory Visit (HOSPITAL_COMMUNITY): Payer: Self-pay | Admitting: General Surgery

## 2019-06-16 ENCOUNTER — Telehealth (HOSPITAL_COMMUNITY): Payer: Self-pay

## 2019-06-16 DIAGNOSIS — C50411 Malignant neoplasm of upper-outer quadrant of right female breast: Secondary | ICD-10-CM

## 2019-06-16 NOTE — Telephone Encounter (Signed)
Called to schedule port placement, no answer, left vm. AW  

## 2019-06-18 ENCOUNTER — Telehealth: Payer: Self-pay | Admitting: Licensed Clinical Social Worker

## 2019-06-18 NOTE — Telephone Encounter (Signed)
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 Briefly discussed this result. Patient would like talk in more detail on Monday, I will call her back then.  

## 2019-06-22 ENCOUNTER — Encounter: Payer: Self-pay | Admitting: Licensed Clinical Social Worker

## 2019-06-22 ENCOUNTER — Ambulatory Visit: Payer: Self-pay | Admitting: Licensed Clinical Social Worker

## 2019-06-22 ENCOUNTER — Telehealth: Payer: Self-pay | Admitting: Licensed Clinical Social Worker

## 2019-06-22 DIAGNOSIS — Z1501 Genetic susceptibility to malignant neoplasm of breast: Secondary | ICD-10-CM | POA: Insufficient documentation

## 2019-06-22 DIAGNOSIS — Z1589 Genetic susceptibility to other disease: Secondary | ICD-10-CM | POA: Insufficient documentation

## 2019-06-22 DIAGNOSIS — Z803 Family history of malignant neoplasm of breast: Secondary | ICD-10-CM

## 2019-06-22 DIAGNOSIS — Z1379 Encounter for other screening for genetic and chromosomal anomalies: Secondary | ICD-10-CM | POA: Insufficient documentation

## 2019-06-22 DIAGNOSIS — C50411 Malignant neoplasm of upper-outer quadrant of right female breast: Secondary | ICD-10-CM

## 2019-06-22 NOTE — Telephone Encounter (Signed)
Discussed patient's test result in more detail. We reviewed the BRIP1 gene, cancers associated, and management guidelines. We discussed next steps and talked about who in the family needs testing next.

## 2019-06-22 NOTE — Progress Notes (Signed)
GENETIC TEST RESULTS  HPI:  Marisa Spears was previously seen in the [facility] clinic due to a personal and family history of breast cancer and concerns regarding a hereditary predisposition to cancer. Please refer to our prior cancer genetics clinic note for more information regarding our discussion, assessment and recommendations, at the time. Marisa Spears recent genetic test results were disclosed to her, as were recommendations warranted by these results. These results and recommendations are discussed in more detail below.  CANCER HISTORY:  Oncology History  Malignant neoplasm of upper-outer quadrant of right breast in female, estrogen receptor positive ([place])  1992 Miscellaneous   Left mastectomy followed by adjuvant chemotherapy   05/22/2019 Initial Diagnosis   Patient palpated a lump in the right breast. Mammogram showed 2.2cm mass in the UOQ, with no right axillary adenopathy. 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 The Common Hereditary Cancers Panel offered by [facility] includes sequencing and/or deletion duplication testing of the following 48 genes: APC, ATM, AXIN2, BARD1, BMPR1A, BRCA1, BRCA2, BRIP1, CDH1, CDKN2A (p14ARF), CDKN2A (p16INK4a), CKD4, CHEK2, CTNNA1, DICER1, EPCAM (Deletion/duplication testing only), GREM1 (promoter region deletion/duplication testing only), KIT, MEN1, MLH1, MSH2, MSH3, MSH6, MUTYH, NBN, NF1, NHTL1, PALB2, PDGFRA, PMS2, POLD1, POLE, PTEN, RAD50, RAD51C, RAD51D, RNF43, SDHB, SDHC, SDHD, SMAD4, SMARCA4. STK11, TP53, TSC1,  TSC2, and VHL.  The following genes were evaluated for sequence changes only: SDHA and HOXB13 c.251G>A variant only. The report date is 06/18/2019.     FAMILY HISTORY:  We obtained a detailed, 4-generation family history.  Significant diagnoses are listed below: Family History  Problem Relation Age of Onset   Breast cancer Sister     Marisa Spears has a son and a daughter. Her daughter is 6 and has had TAH-BSO. The patient has 1 granddaughter and 3 grandsons. She has one full brother living at 47, and one maternal half sister living at 30. This half sister had breast cancer at 37 and patient believes she had genetic testing that was negative.   Marisa Spears mother passed away at 11, no cancers. The patient had 2 maternal aunts, 1 maternal uncle, no cancers. No known cancers in maternal cousins. No known cancers in maternal grandparents.  Marisa Spears father passed away at 82, no cancers. The patient had 9 paternal uncles, 3 paternal aunts. One of her uncles had a daughter that had breast cancer. No known cancers in paternal grandparents.  Marisa Spears is unaware of previous family history of genetic testing for hereditary cancer risks. There is no reported Ashkenazi Jewish ancestry. There is no known consanguinity.  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   The STAT Breast cancer panel offered by [facility] includes sequencing and rearrangement analysis for the following 9 genes:  ATM, BRCA1, BRCA2, CDH1, CHEK2, PALB2, PTEN, STK11 and TP53.    The Common Hereditary Cancers Panel offered by [facility] includes sequencing and/or deletion duplication testing of the following 47  genes: APC, ATM, AXIN2, BARD1, BMPR1A, BRCA1, BRCA2, BRIP1, CDH1, CDKN2A (p14ARF), CDKN2A (p16INK4a), CKD4, CHEK2, CTNNA1, DICER1, EPCAM (Deletion/duplication  testing only), GREM1 (promoter region deletion/duplication testing only), KIT, MEN1, MLH1, MSH2, MSH3, MSH6, MUTYH, NBN, NF1, NHTL1, PALB2, PDGFRA, PMS2, POLD1, POLE, PTEN, RAD50, RAD51C, RAD51D,  SDHB, SDHC, SDHD, SMAD4, SMARCA4. STK11, TP53, TSC1, TSC2, and VHL.  The following genes were evaluated for sequence changes only: SDHA and HOXB13 c.251G>A variant only.  The test report has been scanned into EPIC and is located under the Molecular Pathology section of the Results Review tab.  A portion of the result report is included below for reference.     We discussed with Marisa Spears that because current genetic testing is not perfect, it is possible there may be a gene mutation in one of these genes that current testing cannot detect, but that chance is small.  We also discussed, that there could be another gene that has not yet been discovered, or that we have not yet tested, that is responsible for the cancer diagnoses in the family. It is also possible there is a hereditary cause for the cancer in the family that Marisa Spears did not inherit and therefore was not identified in her testing.  Therefore, it is important to remain in touch with cancer genetics in the future so that we can continue to offer Marisa Spears the most up to date genetic testing.   Genetic testing did identify a variant of uncertain significance (VUS) was identified in the [place] gene called c.[id number]_1261-5insGAA (Intronic).  At this time, it is unknown if this variant is associated with increased cancer risk or if this is a normal finding, but most variants such as this get reclassified to being inconsequential. It should not be used to make medical management decisions. With time, we suspect the lab will determine the significance of this variant, if any. If we do learn more about it, we will try to contact Marisa Spears to discuss it further. However, it is important to stay in touch with [place] periodically and keep the address and phone number up  to date.  BRIP1  Clinical condition Women who are carriers of a single pathogenic BRIP1 variant have an increased risk of breast and ovarian cancer. The exact risk figures for breast cancer have yet to be determined; however, studies suggest the risk of ovarian cancer is approximately 8% and may be up to 15% (PMID: 33295188, 41660630, 16010932, 35573220). An individual with a BRIP1 pathogenic variant will not necessarily develop cancer in their lifetime, but the risk for cancer is increased over the general population risk.   BRIP1 also has preliminary evidence of an association with hematologic malignancies. The meaning of preliminary-evidence genes is that association between the gene and the specific condition has not been completely established. This uncertainty may be resolved as new information becomes available, and therefore clinicians may continue to order these preliminary-evidence genes.  Inheritance Hereditary predisposition to cancer due to a single pathogenic variant in the BRIP1 gene has autosomal dominant inheritance. This means that an individual with a pathogenic variant has a 50% chance of passing the condition on to their offspring. Once such a variant is detected in an individual, it is possible to identify at-risk relatives who can pursue testing for this specific familial variant. Many cases are inherited from a parent, but some cases can occur spontaneously (i.e., an individual with a pathogenic variant has parents who do not have it).   Additionally, individuals with  a pathogenic variant in BRIP1 are carriers of Fanconi anemia type J. Fanconi anemia is an autosomal recessive disorder that is characterized by bone marrow failure and variable presentation of anomalies, including short stature, abnormal skin pigmentation, abnormal thumbs, malformations of the skeletal and central nervous systems, and developmental delay (PMID: 8341962, 22979892). Risks for leukemia and early onset  solid tumors are significantly elevated (PMID: 11941740, 81448185, 63149702). For there to be a risk of Fanconi anemia in offspring, both parents would each have to have a single pathogenic variant in [place]; in such a case, the risk of having an affected child is 25%.  Management The Spears (NCCN) recommends consideration of prophylactic salpingo-oophorectomy (surgical removal of the ovaries and fallopian tubes) for women with a pathogenic variant in BRIP1 around 45-50, or earlier depending on family history (NCCN v.1.2020). Women electing to defer prophylactic oophorectomy can consider screening for serum CA-125 and transvaginal ultrasound; however, data do not support such screening and it should not be a substitute for preventive surgery. The current NCCN guidelines do not recommend additional breast cancer screening for individuals with a single pathogenic BRIP1 variant beyond what is recommended for the general population. However, they caution that cancer screening should ultimately be guided by personal and family history (NCCN v.1.2020).  An individuals cancer risk and medical management are not determined by genetic test results alone. Overall cancer risk assessment incorporates additional factors, including personal medical history, family history, and any available genetic information that may result in a personalized plan for cancer prevention and surveillance.  Even though data regarding BRIP1 is preliminary, knowing if a pathogenic variant is present is advantageous. At-risk relatives can be identified, enabling pursuit of a diagnostic evaluation. Further, the available information regarding hereditary cancer susceptibility genes is constantly evolving and more clinically relevant data regarding BRIP1 are likely to become available in the near future. Awareness of this cancer predisposition encourages patients and their providers to inform at-risk family members,  to diligently follow standard screening protocols, and to be vigilant in maintaining close and regular contact with their local genetics clinic in anticipation of new information.  FAMILY MEMBERS: It is important that all of Marisa Spears's relatives (both men and women) know of the presence of this gene mutation. Site-specific genetic testing can sort out who in the family is at risk and who is not.  Marisa Spears children and siblings have a 50% chance to have inherited this mutation. We recommend they have genetic testing for this same mutation, as identifying the presence of this mutation would allow them to also take advantage of risk-reducing measures.   PLAN:   1. These results will be made available to her care team and PCP. Marisa Spears expressed that she felt overwhelmed by everything going on for her right now, and does not want to think about RRSO right now. She said she may consider it in the future. She wants to talk more about it with her care team and gynecologist.   2. Marisa Spears plans to discuss these results with her family and will reach out to [place] if we can be of any assistance in coordinating genetic testing for any of her relatives. She gives [place] permission to discuss her results with her sister Marisa Spears, who resides in [place]. She also gives [place] permission to discuss with her daughter, Marisa Spears. Of note, Marisa Spears has had TAH-BSO.   SUPPORT AND RESOURCES: If Ms. Stamour  is interested in BRIP1-specific information and support,  there is a group, Facing Our Risk (www.facingourrisk.com)  which some people have found useful. They provide opportunities to speak with other individuals from high-risk families. To locate genetic counselors in other cities, visit the website of the [employer] (ArtistMovie.se) and Secretary/administrator for a Social worker by zip code.  We encouraged Ms. Donlon to remain in contact with [place] on an annual basis so we can update her personal and family  histories, and let her know of advances in cancer genetics that may benefit the family. Our contact number was provided. Ms. Payer questions were answered to her satisfaction today, and she knows she is welcome to call anytime with additional questions.   Faith Rogue, MS Genetic Counselor [email or web address] Phone: [phone]

## 2019-07-02 NOTE — Pre-Procedure Instructions (Addendum)
Nylene Inlow  07/02/2019      [facility] [address] [place] [address] Phone: [phone] Fax: [phone]    Your procedure is scheduled on July 09, 2019.  Report to [facility] Admitting at 600 AM.  Call this number if you have problems the morning of surgery:  [phone]   Remember:  Do not eat after midnight.  You may drink clear liquids until 500 AM.  Clear liquids allowed are: Water, Juice (non-citric and without pulp), Clear Tea, Black Coffee only and Gatorade    Take these medicines the morning of surgery with A SIP OF WATER -NONE  Follow your surgeon's instructions on when to hold/resume aspirin.  If no instructions were given call the office to determine how they would like to you take aspirin   7 days prior to surgery STOP taking any Aleve, Naproxen, Ibuprofen, Motrin, Advil, Goody's, BC's, all herbal medications, fish oil, and all vitamins    Day of surgery:  Do not wear jewelry, make-up or nail polish.  Do not wear lotions, powders, or perfumes, or deodorant.  Do not shave 48 hours prior to surgery.    Do not bring valuables to the hospital.  [facility] is not responsible for any belongings or valuables.  IF you are a smoker, DO NOT Smoke 24 hours prior to surgery   IF you wear a CPAP at night please bring your mask, tubing, and machine the morning of surgery    Remember that you must have someone to transport you home after your surgery, and remain with you for 24 hours if you are discharged the same day.  Contacts, dentures or bridgework may not be worn into surgery.  Leave your suitcase in the car.  After surgery it may be brought to your room.  For patients admitted to the hospital, discharge time will be determined by your treatment team.  Patients discharged the day of surgery will not be allowed to drive home.    [facility]- Preparing For Surgery  Before surgery, you can  play an important role. Because skin is not sterile, your skin needs to be as free of germs as possible. You can reduce the number of germs on your skin by washing with CHG (chlorahexidine gluconate) Soap before surgery.  CHG is an antiseptic cleaner which kills germs and bonds with the skin to continue killing germs even after washing.    Oral Hygiene is also important to reduce your risk of infection.  Remember - BRUSH YOUR TEETH THE MORNING OF SURGERY WITH YOUR REGULAR TOOTHPASTE  Please do not use if you have an allergy to CHG or antibacterial soaps. If your skin becomes reddened/irritated stop using the CHG.  Do not shave (including legs and underarms) for at least 48 hours prior to first CHG shower. It is OK to shave your face.  Please follow these instructions carefully.   1. Shower the NIGHT BEFORE SURGERY and the MORNING OF SURGERY with CHG.   2. If you chose to wash your hair, wash your hair first as usual with your normal shampoo.  3. After you shampoo, rinse your hair and body thoroughly to remove the shampoo.  4. Use CHG as you would any other liquid soap. You can apply CHG directly to the skin and wash gently with a scrungie or a clean washcloth.   5. Apply the CHG Soap to your body ONLY FROM  THE NECK DOWN.  Do not use on open wounds or open sores. Avoid contact with your eyes, ears, mouth and genitals (private parts). Wash Face and genitals (private parts)  with your normal soap.  6. Wash thoroughly, paying special attention to the area where your surgery will be performed.  7. Thoroughly rinse your body with warm water from the neck down.  8. DO NOT shower/wash with your normal soap after using and rinsing off the CHG Soap.  9. Pat yourself dry with a CLEAN TOWEL.  10. Wear CLEAN PAJAMAS to bed the night before surgery, wear comfortable clothes the morning of surgery  11. Place CLEAN SHEETS on your bed the night of your first shower and DO NOT SLEEP WITH PETS.  Day of  Surgery: Shower as above Do not apply any deodorants/lotions.  Please wear clean clothes to the hospital/surgery center.   Remember to brush your teeth WITH YOUR REGULAR TOOTHPASTE.  Please read over the following fact sheets that you were given.

## 2019-07-03 ENCOUNTER — Encounter (HOSPITAL_COMMUNITY): Payer: Self-pay

## 2019-07-03 ENCOUNTER — Other Ambulatory Visit: Payer: Self-pay

## 2019-07-03 ENCOUNTER — Encounter (HOSPITAL_COMMUNITY)
Admission: RE | Admit: 2019-07-03 | Discharge: 2019-07-03 | Disposition: A | Discharge status: Home / Self Care | Payer: Medicare Other | Source: Ambulatory Visit | Attending: General Surgery | Admitting: General Surgery

## 2019-07-03 DIAGNOSIS — Z20828 Contact with and (suspected) exposure to other viral communicable diseases: Secondary | ICD-10-CM | POA: Diagnosis not present

## 2019-07-03 DIAGNOSIS — C50911 Malignant neoplasm of unspecified site of right female breast: Secondary | ICD-10-CM | POA: Diagnosis not present

## 2019-07-03 DIAGNOSIS — Z01818 Encounter for other preprocedural examination: Secondary | ICD-10-CM | POA: Diagnosis not present

## 2019-07-03 HISTORY — DX: Other specified postprocedural states: R11.2

## 2019-07-03 HISTORY — DX: Essential (primary) hypertension: I10

## 2019-07-03 HISTORY — DX: Other specified postprocedural states: Z98.890

## 2019-07-03 LAB — BASIC METABOLIC PANEL
Anion gap: 10 (ref 5–15)
BUN: 9 mg/dL (ref 8–23)
CO2: 27 mmol/L (ref 22–32)
Calcium: 9.8 mg/dL (ref 8.9–10.3)
Chloride: 102 mmol/L (ref 98–111)
Creatinine, Ser: 0.74 mg/dL (ref 0.44–1.00)
GFR calc Af Amer: >60 mL/min (ref >60)
GFR calc non Af Amer: >60 mL/min (ref >60)
Glucose, Bld: 109 mg/dL — ABNORMAL HIGH (ref 70–99)
Potassium: 4.1 mmol/L (ref 3.5–5.1)
Sodium: 139 mmol/L (ref 135–145)

## 2019-07-03 LAB — CBC
HCT: 38.5 % (ref 36.0–46.0)
Hemoglobin: 13 g/dL (ref 12.0–15.0)
MCH: 31.2 pg (ref 26.0–34.0)
MCHC: 33.8 g/dL (ref 30.0–36.0)
MCV: 92.3 fL (ref 80.0–100.0)
Platelets: 242 10*3/uL (ref 150–400)
RBC: 4.17 MIL/uL (ref 3.87–5.11)
RDW: 12.5 % (ref 11.5–15.5)
WBC: 7.7 10*3/uL (ref 4.0–10.5)
nRBC: 0 % (ref 0.0–0.2)

## 2019-07-03 NOTE — Progress Notes (Signed)
  Coronavirus Screening Scheduled for COVID on 07-06-19 Have you experienced the following symptoms:  Cough yes/no: No Fever (>100.56F)  yes/no: No Runny nose yes/no: No Sore throat yes/no: No Difficulty breathing/shortness of breath  yes/no: No Have you or a family member traveled in the last 14 days and where? yes/no: No  PCP - Dr Earney Mallet  Cardiologist - denies  Oncologist-Dr [name]  Ortho-Dr Fredderick Phenix  Chest x-ray - 05-27-19 Epic  EKG - Today (Sinus Loletha Grayer. )  Stress Test - denies  ECHO - denies  Cardiac Cath - denies  AICD-denies PM-denies LOOPdenies  Sleep Study - N CPAP - NA  LABS-CBC,BMP  ASA-81mg . Pt given instructions to call her Surgeon's office to find out if and when to stop/hold Aspirin.  ERAS-Clear liquids . Instructions given.  HA1C-No. Denies being a diabetic. Fasting Blood Sugar - NA Checks Blood Sugar ___0__ times a day  Anesthesia- Yes. Review EKG  Pt denies having chest pain, palpitations,sob, or fever at this time. All instructions explained to the pt, with a verbal understanding of the material. Pt agrees to go over the instructions while at home for a better understanding. Pt also instructed to self quarantine after being tested for COVID-19. The opportunity to ask questions was provided.

## 2019-07-06 ENCOUNTER — Other Ambulatory Visit (HOSPITAL_COMMUNITY)
Admission: RE | Admit: 2019-07-06 | Discharge: 2019-07-06 | Disposition: A | Discharge status: Home / Self Care | Payer: Medicare Other | Source: Ambulatory Visit | Attending: General Surgery | Admitting: General Surgery

## 2019-07-06 DIAGNOSIS — Z01818 Encounter for other preprocedural examination: Secondary | ICD-10-CM | POA: Diagnosis not present

## 2019-07-06 LAB — SARS CORONAVIRUS 2 (TAT 6-24 HRS): SARS Coronavirus 2: NEGATIVE

## 2019-07-08 NOTE — Anesthesia Preprocedure Evaluation (Addendum)
Anesthesia Evaluation  Patient identified by MRN, date of birth, ID band Patient awake    Reviewed: Allergy & Precautions, NPO status , Patient's Chart, lab work & pertinent test results  History of Anesthesia Complications (+) PONV and history of anesthetic complications  Airway Mallampati: II       Dental  (+) Edentulous Upper, Edentulous Lower, Dental Advisory Given   Pulmonary former smoker,    Pulmonary exam normal        Cardiovascular hypertension, Normal cardiovascular exam     Neuro/Psych negative neurological ROS     GI/Hepatic negative GI ROS, Neg liver ROS,   Endo/Other  negative endocrine ROS  Renal/GU negative Renal ROS     Musculoskeletal negative musculoskeletal ROS (+)   Abdominal   Peds  Hematology negative hematology ROS (+)   Anesthesia Other Findings Day of surgery medications reviewed with the patient.  Reproductive/Obstetrics                            Anesthesia Physical Anesthesia Plan  ASA: II  Anesthesia Plan: General   Post-op Pain Management:  Regional for Post-op pain   Induction: Intravenous  PONV Risk Score and Plan: 4 or greater and Ondansetron, Dexamethasone, Scopolamine patch - Pre-op and Diphenhydramine  Airway Management Planned: LMA  Additional Equipment:   Intra-op Plan:   Post-operative Plan: Extubation in OR  Informed Consent: I have reviewed the patients History and Physical, chart, labs and discussed the procedure including the risks, benefits and alternatives for the proposed anesthesia with the patient or authorized representative who has indicated his/her understanding and acceptance.     Dental advisory given  Plan Discussed with: CRNA and Anesthesiologist  Anesthesia Plan Comments:        Anesthesia Quick Evaluation

## 2019-07-09 ENCOUNTER — Encounter (HOSPITAL_COMMUNITY)
Admission: RE | Disposition: A | Discharge status: Home / Self Care | Payer: Self-pay | Source: Home / Self Care | Attending: General Surgery

## 2019-07-09 ENCOUNTER — Ambulatory Visit (HOSPITAL_COMMUNITY)
Admission: RE | Admit: 2019-07-09 | Discharge: 2019-07-09 | Disposition: A | Discharge status: Home / Self Care | Payer: Medicare Other | Source: Ambulatory Visit | Attending: General Surgery | Admitting: General Surgery

## 2019-07-09 ENCOUNTER — Other Ambulatory Visit: Payer: Self-pay

## 2019-07-09 ENCOUNTER — Encounter (HOSPITAL_COMMUNITY): Payer: Self-pay

## 2019-07-09 ENCOUNTER — Ambulatory Visit (HOSPITAL_COMMUNITY): Payer: Medicare Other | Admitting: Anesthesiology

## 2019-07-09 ENCOUNTER — Ambulatory Visit (HOSPITAL_COMMUNITY)
Admission: RE | Admit: 2019-07-09 | Discharge: 2019-07-10 | Disposition: A | Discharge status: Home / Self Care | Payer: Medicare Other | Attending: General Surgery | Admitting: General Surgery

## 2019-07-09 ENCOUNTER — Ambulatory Visit (HOSPITAL_COMMUNITY): Payer: Medicare Other | Admitting: Physician Assistant

## 2019-07-09 DIAGNOSIS — Z9012 Acquired absence of left breast and nipple: Secondary | ICD-10-CM | POA: Diagnosis not present

## 2019-07-09 DIAGNOSIS — C50911 Malignant neoplasm of unspecified site of right female breast: Secondary | ICD-10-CM | POA: Diagnosis present

## 2019-07-09 DIAGNOSIS — C50411 Malignant neoplasm of upper-outer quadrant of right female breast: Secondary | ICD-10-CM | POA: Insufficient documentation

## 2019-07-09 DIAGNOSIS — Z452 Encounter for adjustment and management of vascular access device: Secondary | ICD-10-CM | POA: Insufficient documentation

## 2019-07-09 DIAGNOSIS — Z9221 Personal history of antineoplastic chemotherapy: Secondary | ICD-10-CM | POA: Diagnosis not present

## 2019-07-09 DIAGNOSIS — Z170 Estrogen receptor positive status [ER+]: Secondary | ICD-10-CM | POA: Insufficient documentation

## 2019-07-09 DIAGNOSIS — Z853 Personal history of malignant neoplasm of breast: Secondary | ICD-10-CM | POA: Diagnosis not present

## 2019-07-09 DIAGNOSIS — I10 Essential (primary) hypertension: Secondary | ICD-10-CM | POA: Diagnosis not present

## 2019-07-09 DIAGNOSIS — M1990 Unspecified osteoarthritis, unspecified site: Secondary | ICD-10-CM | POA: Insufficient documentation

## 2019-07-09 DIAGNOSIS — T82524A Displacement of infusion catheter, initial encounter: Secondary | ICD-10-CM

## 2019-07-09 HISTORY — PX: MASTECTOMY W/ SENTINEL NODE BIOPSY: SHX2001

## 2019-07-09 HISTORY — PX: PORT-A-CATH REMOVAL: SHX5289

## 2019-07-09 SURGERY — MASTECTOMY WITH SENTINEL LYMPH NODE BIOPSY
Anesthesia: General | Site: Chest | Laterality: Right

## 2019-07-09 MED ORDER — ACETAMINOPHEN 500 MG PO TABS
1000.0000 mg | ORAL_TABLET | ORAL | Status: AC
  Administered 2019-07-09: 1000 mg via ORAL
  Filled 2019-07-09: qty 2

## 2019-07-09 MED ORDER — MIDAZOLAM HCL 2 MG/2ML IJ SOLN
2.0000 mg | Freq: Once | INTRAMUSCULAR | Status: AC
  Administered 2019-07-09: 07:00:00 2 mg via INTRAVENOUS

## 2019-07-09 MED ORDER — ONDANSETRON HCL 4 MG/2ML IJ SOLN
INTRAMUSCULAR | Status: AC
  Filled 2019-07-09: qty 2

## 2019-07-09 MED ORDER — FENTANYL CITRATE (PF) 100 MCG/2ML IJ SOLN
100.0000 ug | Freq: Once | INTRAMUSCULAR | Status: AC
  Administered 2019-07-09: 100 ug via INTRAVENOUS

## 2019-07-09 MED ORDER — FENTANYL CITRATE (PF) 100 MCG/2ML IJ SOLN
INTRAMUSCULAR | Status: AC
  Administered 2019-07-09: 100 ug via INTRAVENOUS
  Filled 2019-07-09: qty 2

## 2019-07-09 MED ORDER — PANTOPRAZOLE SODIUM 40 MG IV SOLR
40.0000 mg | Freq: Every day | INTRAVENOUS | Status: DC
  Administered 2019-07-09: 40 mg via INTRAVENOUS
  Filled 2019-07-09: qty 40

## 2019-07-09 MED ORDER — SODIUM CHLORIDE (PF) 0.9 % IJ SOLN
INTRAMUSCULAR | Status: AC
  Filled 2019-07-09: qty 10

## 2019-07-09 MED ORDER — CELECOXIB 200 MG PO CAPS
200.0000 mg | ORAL_CAPSULE | ORAL | Status: AC
  Administered 2019-07-09: 200 mg via ORAL
  Filled 2019-07-09: qty 1

## 2019-07-09 MED ORDER — SCOPOLAMINE 1 MG/3DAYS TD PT72
1.0000 | MEDICATED_PATCH | TRANSDERMAL | Status: DC
  Administered 2019-07-09: 1.5 mg via TRANSDERMAL
  Filled 2019-07-09: qty 1

## 2019-07-09 MED ORDER — FENTANYL CITRATE (PF) 100 MCG/2ML IJ SOLN
INTRAMUSCULAR | Status: DC | PRN
  Administered 2019-07-09: 100 ug via INTRAVENOUS
  Administered 2019-07-09: 50 ug via INTRAVENOUS

## 2019-07-09 MED ORDER — PROMETHAZINE HCL 25 MG/ML IJ SOLN: 6.2500 mg | INTRAMUSCULAR | Status: DC | PRN

## 2019-07-09 MED ORDER — FENTANYL CITRATE (PF) 100 MCG/2ML IJ SOLN
25.0000 ug | INTRAMUSCULAR | Status: DC | PRN
  Administered 2019-07-09: 50 ug via INTRAVENOUS

## 2019-07-09 MED ORDER — KCL IN DEXTROSE-NACL 20-5-0.9 MEQ/L-%-% IV SOLN
INTRAVENOUS | Status: DC
  Administered 2019-07-09: 14:00:00 via INTRAVENOUS
  Filled 2019-07-09: qty 1000

## 2019-07-09 MED ORDER — FENTANYL CITRATE (PF) 100 MCG/2ML IJ SOLN
INTRAMUSCULAR | Status: AC
  Filled 2019-07-09: qty 2

## 2019-07-09 MED ORDER — 0.9 % SODIUM CHLORIDE (POUR BTL) OPTIME
TOPICAL | Status: DC | PRN
  Administered 2019-07-09: 1000 mL

## 2019-07-09 MED ORDER — BUPIVACAINE HCL (PF) 0.5 % IJ SOLN
INTRAMUSCULAR | Status: DC | PRN
  Administered 2019-07-09: 15 mL via PERINEURAL

## 2019-07-09 MED ORDER — TRIAMTERENE-HCTZ 37.5-25 MG PO TABS
1.0000 | ORAL_TABLET | Freq: Every day | ORAL | Status: DC
  Administered 2019-07-09 – 2019-07-10 (×2): 1 via ORAL
  Filled 2019-07-09 (×2): qty 1

## 2019-07-09 MED ORDER — GABAPENTIN 300 MG PO CAPS
300.0000 mg | ORAL_CAPSULE | ORAL | Status: AC
  Administered 2019-07-09: 300 mg via ORAL
  Filled 2019-07-09: qty 1

## 2019-07-09 MED ORDER — LACTATED RINGERS IV SOLN
INTRAVENOUS | Status: DC
  Administered 2019-07-09: 07:00:00 via INTRAVENOUS

## 2019-07-09 MED ORDER — CEFAZOLIN SODIUM-DEXTROSE 2-4 GM/100ML-% IV SOLN
2.0000 g | INTRAVENOUS | Status: AC
  Administered 2019-07-09: 2 g via INTRAVENOUS
  Filled 2019-07-09: qty 100

## 2019-07-09 MED ORDER — PROPOFOL 10 MG/ML IV BOLUS
INTRAVENOUS | Status: AC
  Filled 2019-07-09: qty 40

## 2019-07-09 MED ORDER — BUPIVACAINE-EPINEPHRINE (PF) 0.25% -1:200000 IJ SOLN
INTRAMUSCULAR | Status: AC
  Filled 2019-07-09: qty 30

## 2019-07-09 MED ORDER — STERILE WATER FOR IRRIGATION IR SOLN
Status: DC | PRN
  Administered 2019-07-09: 1000 mL

## 2019-07-09 MED ORDER — HEPARIN SODIUM (PORCINE) 5000 UNIT/ML IJ SOLN
5000.0000 [IU] | Freq: Three times a day (TID) | INTRAMUSCULAR | Status: DC
  Administered 2019-07-10: 5000 [IU] via SUBCUTANEOUS
  Filled 2019-07-09: qty 1

## 2019-07-09 MED ORDER — PROPOFOL 500 MG/50ML IV EMUL
INTRAVENOUS | Status: DC | PRN
  Administered 2019-07-09: 25 ug/kg/min via INTRAVENOUS

## 2019-07-09 MED ORDER — DEXAMETHASONE SODIUM PHOSPHATE 10 MG/ML IJ SOLN
INTRAMUSCULAR | Status: AC
  Filled 2019-07-09: qty 1

## 2019-07-09 MED ORDER — ONDANSETRON 4 MG PO TBDP: 4.0000 mg | ORAL_TABLET | Freq: Four times a day (QID) | ORAL | Status: DC | PRN

## 2019-07-09 MED ORDER — ONDANSETRON HCL 4 MG/2ML IJ SOLN
INTRAMUSCULAR | Status: DC | PRN
  Administered 2019-07-09: 4 mg via INTRAVENOUS

## 2019-07-09 MED ORDER — CHLORHEXIDINE GLUCONATE CLOTH 2 % EX PADS: 6.0000 | MEDICATED_PAD | Freq: Once | CUTANEOUS | Status: DC

## 2019-07-09 MED ORDER — ONDANSETRON HCL 4 MG/2ML IJ SOLN: 4.0000 mg | Freq: Four times a day (QID) | INTRAMUSCULAR | Status: DC | PRN

## 2019-07-09 MED ORDER — DEXAMETHASONE SODIUM PHOSPHATE 10 MG/ML IJ SOLN
INTRAMUSCULAR | Status: DC | PRN
  Administered 2019-07-09: 5 mg via INTRAVENOUS

## 2019-07-09 MED ORDER — HYDROCODONE-ACETAMINOPHEN 5-325 MG PO TABS
1.0000 | ORAL_TABLET | ORAL | Status: DC | PRN
  Administered 2019-07-10 (×2): 2 via ORAL
  Filled 2019-07-09 (×2): qty 2

## 2019-07-09 MED ORDER — BUPIVACAINE LIPOSOME 1.3 % IJ SUSP
INTRAMUSCULAR | Status: DC | PRN
  Administered 2019-07-09: 10 mL

## 2019-07-09 MED ORDER — METHOCARBAMOL 500 MG PO TABS: 500.0000 mg | ORAL_TABLET | Freq: Four times a day (QID) | ORAL | Status: DC | PRN

## 2019-07-09 MED ORDER — MIDAZOLAM HCL 2 MG/2ML IJ SOLN
INTRAMUSCULAR | Status: AC
  Filled 2019-07-09: qty 2

## 2019-07-09 MED ORDER — DIPHENHYDRAMINE HCL 50 MG/ML IJ SOLN
INTRAMUSCULAR | Status: DC | PRN
  Administered 2019-07-09: 12.5 mg via INTRAVENOUS

## 2019-07-09 MED ORDER — METHYLENE BLUE 0.5 % INJ SOLN
INTRAVENOUS | Status: AC
  Filled 2019-07-09: qty 10

## 2019-07-09 MED ORDER — FENTANYL CITRATE (PF) 250 MCG/5ML IJ SOLN
INTRAMUSCULAR | Status: AC
  Filled 2019-07-09: qty 5

## 2019-07-09 MED ORDER — TRAMADOL HCL 50 MG PO TABS: 50.0000 mg | ORAL_TABLET | Freq: Four times a day (QID) | ORAL | Status: DC | PRN

## 2019-07-09 MED ORDER — DIPHENHYDRAMINE HCL 50 MG/ML IJ SOLN
INTRAMUSCULAR | Status: AC
  Filled 2019-07-09: qty 1

## 2019-07-09 MED ORDER — PRAVASTATIN SODIUM 40 MG PO TABS
40.0000 mg | ORAL_TABLET | Freq: Every day | ORAL | Status: DC
  Administered 2019-07-09: 40 mg via ORAL
  Filled 2019-07-09: qty 1

## 2019-07-09 MED ORDER — MIDAZOLAM HCL 2 MG/2ML IJ SOLN
INTRAMUSCULAR | Status: AC
  Administered 2019-07-09: 2 mg via INTRAVENOUS
  Filled 2019-07-09: qty 2

## 2019-07-09 MED ORDER — FENTANYL CITRATE (PF) 100 MCG/2ML IJ SOLN: 25.0000 ug | INTRAMUSCULAR | Status: DC | PRN

## 2019-07-09 MED ORDER — PROPOFOL 10 MG/ML IV BOLUS
INTRAVENOUS | Status: DC | PRN
  Administered 2019-07-09: 150 mg via INTRAVENOUS
  Administered 2019-07-09 (×2): 20 mg via INTRAVENOUS

## 2019-07-09 SURGICAL SUPPLY — 60 items
APPLIER CLIP 9.375 MED OPEN (MISCELLANEOUS) ×8
BINDER BREAST LRG (GAUZE/BANDAGES/DRESSINGS) ×4 IMPLANT
BINDER BREAST XLRG (GAUZE/BANDAGES/DRESSINGS) IMPLANT
BIOPATCH RED 1 DISK 7.0 (GAUZE/BANDAGES/DRESSINGS) ×3 IMPLANT
BIOPATCH RED 1IN DISK 7.0MM (GAUZE/BANDAGES/DRESSINGS) ×1
CANISTER SUCT 3000ML PPV (MISCELLANEOUS) ×4 IMPLANT
CHLORAPREP W/TINT 10.5 ML (MISCELLANEOUS) ×4 IMPLANT
CHLORAPREP W/TINT 26 (MISCELLANEOUS) ×4 IMPLANT
CLIP APPLIE 9.375 MED OPEN (MISCELLANEOUS) ×4 IMPLANT
CLOSURE STERI-STRIP 1/4X4 (GAUZE/BANDAGES/DRESSINGS) ×4 IMPLANT
CONT SPEC 4OZ CLIKSEAL STRL BL (MISCELLANEOUS) ×4 IMPLANT
COVER PROBE W GEL 5X96 (DRAPES) ×4 IMPLANT
COVER SURGICAL LIGHT HANDLE (MISCELLANEOUS) ×4 IMPLANT
COVER WAND RF STERILE (DRAPES) ×4 IMPLANT
DERMABOND ADVANCED (GAUZE/BANDAGES/DRESSINGS) ×2
DERMABOND ADVANCED .7 DNX12 (GAUZE/BANDAGES/DRESSINGS) ×2 IMPLANT
DEVICE DISSECT PLASMABLAD 3.0S (MISCELLANEOUS) ×2 IMPLANT
DRAIN CHANNEL 19F RND (DRAIN) ×8 IMPLANT
DRAPE CHEST BREAST 15X10 FENES (DRAPES) ×4 IMPLANT
DRAPE LAPAROTOMY 100X72 PEDS (DRAPES) ×4 IMPLANT
DRSG PAD ABDOMINAL 8X10 ST (GAUZE/BANDAGES/DRESSINGS) ×4 IMPLANT
DRSG TEGADERM 4X4.75 (GAUZE/BANDAGES/DRESSINGS) ×4 IMPLANT
ELECT CAUTERY BLADE 6.4 (BLADE) ×4 IMPLANT
ELECT REM PT RETURN 9FT ADLT (ELECTROSURGICAL) ×4
ELECTRODE REM PT RTRN 9FT ADLT (ELECTROSURGICAL) ×2 IMPLANT
EVACUATOR SILICONE 100CC (DRAIN) ×8 IMPLANT
FILTER IN LINE W/DETACHED HOSE (FILTER) ×4 IMPLANT
GAUZE 4X4 16PLY RFD (DISPOSABLE) ×4 IMPLANT
GAUZE SPONGE 4X4 12PLY STRL (GAUZE/BANDAGES/DRESSINGS) ×4 IMPLANT
GLOVE BIO SURGEON STRL SZ7.5 (GLOVE) ×4 IMPLANT
GOWN STRL REUS W/ TWL LRG LVL3 (GOWN DISPOSABLE) ×4 IMPLANT
GOWN STRL REUS W/TWL LRG LVL3 (GOWN DISPOSABLE) ×4
KIT BASIN OR (CUSTOM PROCEDURE TRAY) ×4 IMPLANT
KIT TURNOVER KIT B (KITS) ×4 IMPLANT
LIGHT WAVEGUIDE WIDE FLAT (MISCELLANEOUS) IMPLANT
NEEDLE 18GX1X1/2 (RX/OR ONLY) (NEEDLE) IMPLANT
NEEDLE FILTER BLUNT 18X 1/2SAF (NEEDLE)
NEEDLE FILTER BLUNT 18X1 1/2 (NEEDLE) IMPLANT
NEEDLE HYPO 25GX1X1/2 BEV (NEEDLE) ×4 IMPLANT
NS IRRIG 1000ML POUR BTL (IV SOLUTION) ×4 IMPLANT
PACK GENERAL/GYN (CUSTOM PROCEDURE TRAY) ×4 IMPLANT
PAD ABD 8X10 STRL (GAUZE/BANDAGES/DRESSINGS) ×4 IMPLANT
PAD ARMBOARD 7.5X6 YLW CONV (MISCELLANEOUS) ×8 IMPLANT
PENCIL SMOKE EVACUATOR (MISCELLANEOUS) ×4 IMPLANT
PLASMABLADE 3.0S (MISCELLANEOUS) ×4
SPECIMEN JAR X LARGE (MISCELLANEOUS) ×4 IMPLANT
SUT ETHILON 3 0 FSL (SUTURE) ×8 IMPLANT
SUT MNCRL AB 4-0 PS2 18 (SUTURE) ×12 IMPLANT
SUT VIC AB 0 CT1 27 (SUTURE) ×4
SUT VIC AB 0 CT1 27XBRD ANBCTR (SUTURE) ×4 IMPLANT
SUT VIC AB 3-0 54X BRD REEL (SUTURE) IMPLANT
SUT VIC AB 3-0 BRD 54 (SUTURE)
SUT VIC AB 3-0 SH 18 (SUTURE) ×8 IMPLANT
SUT VIC AB 3-0 SH 27 (SUTURE) ×2
SUT VIC AB 3-0 SH 27X BRD (SUTURE) ×2 IMPLANT
SYR CONTROL 10ML LL (SYRINGE) ×4 IMPLANT
TOWEL GREEN STERILE (TOWEL DISPOSABLE) ×4 IMPLANT
TOWEL GREEN STERILE FF (TOWEL DISPOSABLE) ×4 IMPLANT
TUBE CONNECTING 12'X1/4 (SUCTIONS)
TUBE CONNECTING 12X1/4 (SUCTIONS) IMPLANT

## 2019-07-09 NOTE — Transfer of Care (Signed)
Immediate Anesthesia Transfer of Care Note  Patient: Marisa Spears  Procedure(s) Performed: RIGHT MASTECTOMY WITH SENTINEL LYMPH NODE BIOPSY (Right Breast) REMOVAL PORT-A-CATH (Right Chest)  Patient Location:    Anesthesia Type:General  Level of Consciousness: awake, alert , oriented and sedated  Airway & Oxygen Therapy: Patient Spontanous Breathing and Patient connected to nasal cannula oxygen  Post-op Assessment: Report given to RN, Post -op Vital signs reviewed and stable and Patient moving all extremities  Post vital signs: Reviewed and stable  Last Vitals:  Vitals Value Taken Time  BP    Temp    Pulse    Resp    SpO2      Last Pain:  Vitals:   07/09/19 1035  TempSrc:   PainSc: (P) 0-No pain         Complications: No apparent anesthesia complications

## 2019-07-09 NOTE — H&P (Signed)
Marisa Spears  Location: [facility] Surgery Patient #: [id number] DOB: 1951-04-27 Undefined / Language: Undefined / Race: White Female   History of Present Illness  The patient is a 68 year old female who presents with breast cancer.We are asked to see the patient in consultation by Dr. Lindi Adie to evaluate her for a new right breast cancer. The patient is a 68 year old white female who presents with a palpable mass in the lateral right breast for the last 3 weeks. It measured 2.2cm and axilla looked neg. 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 She does have a h/o left breast cancer that was treated with mastectomy and chemo 28 years ago. she still has her port in from then.   Past Surgical History  Appendectomy  Mastectomy  Left. Tonsillectomy   Diagnostic Studies History Colonoscopy  5-10 years ago Mammogram  1-3 years ago Pap Smear  1-5 years ago  Medication History  Medications Reconciled  Social History  No alcohol use  No caffeine use  No drug use  Tobacco use  Never smoker.  Family History  Family history unknown  First Degree Relatives   Pregnancy / Birth History  Age at menarche  13 years. Age of menopause  56-50 Gravida  2 Irregular periods  Maternal age  19-20 Para  1  Other Problems  Arthritis  Back Pain  Breast Cancer  High blood pressure  Hypercholesterolemia     Review of Systems  General Not Present- Appetite Loss, Chills, Fatigue, Fever, Night Sweats, Weight Gain and Weight Loss. Skin Not Present- Change in Wart/Mole, Dryness, Hives, Jaundice, New Lesions, Non-Healing Wounds, Rash and Ulcer. HEENT Not Present- Earache, Hearing Loss, Hoarseness, Nose Bleed, Oral Ulcers, Ringing in the Ears, Seasonal Allergies, Sinus Pain, Sore Throat, Visual Disturbances, Wears glasses/contact lenses and Yellow Eyes. Respiratory Not Present- Bloody sputum, Chronic Cough, Difficulty Breathing, Snoring  and Wheezing. Breast Not Present- Breast Mass, Breast Pain, Nipple Discharge and Skin Changes. Cardiovascular Not Present- Chest Pain, Difficulty Breathing Lying Down, Leg Cramps, Palpitations, Rapid Heart Rate, Shortness of Breath and Swelling of Extremities. Gastrointestinal Not Present- Abdominal Pain, Bloating, Bloody Stool, Change in Bowel Habits, Chronic diarrhea, Constipation, Difficulty Swallowing, Excessive gas, Gets full quickly at meals, Hemorrhoids, Indigestion, Nausea, Rectal Pain and Vomiting. Female Genitourinary Not Present- Frequency, Nocturia, Painful Urination, Pelvic Pain and Urgency. Musculoskeletal Present- Back Pain and Joint Pain. Not Present- Joint Stiffness, Muscle Pain, Muscle Weakness and Swelling of Extremities. Neurological Not Present- Decreased Memory, Fainting, Headaches, Numbness, Seizures, Tingling, Tremor, Trouble walking and Weakness. Psychiatric Not Present- Anxiety, Bipolar, Change in Sleep Pattern, Depression, Fearful and Frequent crying. Endocrine Not Present- Cold Intolerance, Excessive Hunger, Hair Changes, Heat Intolerance, Hot flashes and New Diabetes. Hematology Not Present- Blood Thinners, Easy Bruising, Excessive bleeding, Gland problems, HIV and Persistent Infections.   Physical Exam General Mental Status-Alert. General Appearance-Consistent with stated age. Hydration-Well hydrated. Voice-Normal.  Head and Neck Head-normocephalic, atraumatic with no lesions or palpable masses. Trachea-midline. Thyroid Gland Characteristics - normal size and consistency.  Eye Eyeball - Bilateral-Extraocular movements intact. Sclera/Conjunctiva - Bilateral-No scleral icterus.  Chest and Lung Exam Chest and lung exam reveals -quiet, even and easy respiratory effort with no use of accessory muscles and on auscultation, normal breath sounds, no adventitious sounds and normal vocal resonance. Inspection Chest Wall - Normal. Back -  normal.  Breast Note: There is a 3 cm palpable mobile mass in the lateral right  breast and a port in the upper right chest wall. There is no palpable mass in the left breast. There is no palpable axillary, supraclavicular, or cervical lymphadenopathy   Cardiovascular Cardiovascular examination reveals -normal heart sounds, regular rate and rhythm with no murmurs and normal pedal pulses bilaterally.  Abdomen Inspection Inspection of the abdomen reveals - No Hernias. Skin - Scar - no surgical scars. Palpation/Percussion Palpation and Percussion of the abdomen reveal - Soft, Non Tender, No Rebound tenderness, No Rigidity (guarding) and No hepatosplenomegaly. Auscultation Auscultation of the abdomen reveals - Bowel sounds normal.  Neurologic Neurologic evaluation reveals -alert and oriented x 3 with no impairment of recent or remote memory. Mental Status-Normal.  Musculoskeletal Normal Exam - Left-Upper Extremity Strength Normal and Lower Extremity Strength Normal. Normal Exam - Right-Upper Extremity Strength Normal and Lower Extremity Strength Normal.  Lymphatic Head & Neck  General Head & Neck Lymphatics: Bilateral - Description - Normal. Axillary  General Axillary Region: Bilateral - Description - Normal. Tenderness - Non Tender. Femoral & Inguinal  Generalized Femoral & Inguinal Lymphatics: Bilateral - Description - Normal. Tenderness - Non Tender.    Assessment & Plan   MALIGNANT NEOPLASM OF UPPER-OUTER QUADRANT OF RIGHT BREAST IN FEMALE, ESTROGEN RECEPTOR POSITIVE (C50.411) Impression: The patient appears to have a 2.2 cm cancer in the lateral right breast with clinically neg nodes. I have talked to her about the different options for treatment. Since she has had a mastectomy on left side she is undecided about how she wants to proceed. She is a good candidate for sentinel node mapping. She will need her old port removed which could require a vascular surgeon  given how long it has been in. She will need a new port which we may have placed by IR at a separate date. I have discussed with her the risks and benefits of the surgery as well as some of the technical aspects and she will let us know when she decides.  Current Plans Referred to Oncology, for evaluation and follow up (Oncology). Routine.

## 2019-07-09 NOTE — Interval H&P Note (Signed)
History and Physical Interval Note:  07/09/2019 7:58 AM  Marisa Spears  has presented today for surgery, with the diagnosis of RIGHT BREAST CANCER.  The various methods of treatment have been discussed with the patient and family. After consideration of risks, benefits and other options for treatment, the patient has consented to  Procedure(s): RIGHT MASTECTOMY WITH SENTINEL LYMPH NODE BIOPSY (Right) REMOVAL PORT-A-CATH (N/A) as a surgical intervention.  The patient's history has been reviewed, patient examined, no change in status, stable for surgery.  I have reviewed the patient's chart and labs.  Questions were answered to the patient's satisfaction.     Autumn Messing III

## 2019-07-09 NOTE — Anesthesia Procedure Notes (Signed)
Procedure Name: LMA Insertion Date/Time: 07/09/2019 8:18 AM Performed by: Scheryl Darter, CRNA Pre-anesthesia Checklist: Patient identified, Emergency Drugs available, Suction available and Patient being monitored Patient Re-evaluated:Patient Re-evaluated prior to induction Oxygen Delivery Method: Circle System Utilized Preoxygenation: Pre-oxygenation with 100% oxygen Induction Type: IV induction Ventilation: Mask ventilation without difficulty LMA: LMA inserted LMA Size: 4.0 Number of attempts: 1 Airway Equipment and Method: Bite block Placement Confirmation: positive ETCO2 Tube secured with: Tape Dental Injury: Teeth and Oropharynx as per pre-operative assessment

## 2019-07-09 NOTE — Anesthesia Procedure Notes (Signed)
Anesthesia Regional Block: Pectoralis block   Pre-Anesthetic Checklist: ,, timeout performed, Correct Patient, Correct Site, Correct Laterality, Correct Procedure, Correct Position, site marked, Risks and benefits discussed,  Surgical consent,  Pre-op evaluation,  At surgeon's request and post-op pain management  Laterality: Right  Prep: chloraprep       Needles:  Injection technique: Single-shot  Needle Type: Echogenic Stimulator Needle     Needle Length: 10cm  Needle Gauge: 21     Additional Needles:   Narrative:  Start time: 07/09/2019 7:14 AM End time: 07/09/2019 7:24 AM Injection made incrementally with aspirations every 5 mL.  Performed by: Personally

## 2019-07-09 NOTE — Op Note (Signed)
    OPERATIVE REPORT  DATE OF SURGERY: 07/09/2019  PATIENT: Marisa Spears, 68 y.o. female MRN: [id number]  DOB: [birth year]  PRE-OPERATIVE DIAGNOSIS: Chronic 9 used right subclavian Port-A-Cath  POST-OPERATIVE DIAGNOSIS:  Same  PROCEDURE: Removal of the catheter portion of the Port-A-Cath  SURGEON:  Curt Jews, M.D.    ANESTHESIA: General  EBL: per anesthesia record  Total I/O In: 990 [P.O.:240; I.V.:650; IV Piggyback:100] Out: 115 [Drains:40; Blood:75]  BLOOD ADMINISTERED: none  DRAINS: none  SPECIMEN: none  COUNTS CORRECT:  YES  PATIENT DISPOSITION:  PACU - hemodynamically stable  PROCEDURE DETAILS: I scrubbed in during the mastectomy on the right with Dr. [name].  The catheter port itself was in the mastectomy specimen.  The Silastic catheter from the subcutaneous tissue to the subcu clavian area was adherent to the fat and subcutaneous tissue.  This was dissected free to the level of the clavicle.  Once this was freed up firm traction freed up the catheter and the catheter was removed in its entirety.  The remainder the procedure will be dictated as a separate note by Dr. Jinny Sanders, M.D., [facility] 07/09/2019 11:23 AM

## 2019-07-09 NOTE — Anesthesia Postprocedure Evaluation (Signed)
Anesthesia Post Note  Patient: Marisa Spears  Procedure(s) Performed: RIGHT MASTECTOMY WITH SENTINEL LYMPH NODE BIOPSY (Right Breast) REMOVAL PORT-A-CATH (Right Chest)     Patient location during evaluation: PACU Anesthesia Type: General Level of consciousness: sedated Pain management: pain level controlled Vital Signs Assessment: post-procedure vital signs reviewed and stable Respiratory status: spontaneous breathing and respiratory function stable Cardiovascular status: stable Postop Assessment: no apparent nausea or vomiting Anesthetic complications: no    Last Vitals:  Vitals:   07/09/19 1120 07/09/19 1137  BP: (!) 162/84 (!) 163/84  Pulse: (!) 53 (!) 50  Resp: 12 14  Temp: 36.5 C (!) 36.4 C  SpO2: 95% 97%    Last Pain:  Vitals:   07/09/19 1105  TempSrc:   PainSc: Asleep                 Admire Bunnell DANIEL

## 2019-07-09 NOTE — Op Note (Signed)
07/09/2019  10:18 AM  PATIENT:  Marisa Spears  68 y.o. female  PRE-OPERATIVE DIAGNOSIS:  RIGHT BREAST CANCER  POST-OPERATIVE DIAGNOSIS:  RIGHT BREAST CANCER  PROCEDURE:  Procedure(s): RIGHT MASTECTOMY WITH RIGHT AXILLARY LYMPH NODE DISSECTION REMOVAL PORT-A-CATH (Right)  SURGEON:  Surgeon(s) and Role:    * [name], MD - Primary    * Early, Arvilla Meres, MD - Assisting  PHYSICIAN ASSISTANT:   ASSISTANTS: Ewell Poe, RNFA   ANESTHESIA:   general  EBL:  75 mL   BLOOD ADMINISTERED:none  DRAINS: (2) Jackson-Pratt drain(s) with closed bulb suction in the prepectoral space   LOCAL MEDICATIONS USED:  NONE  SPECIMEN:  Source of Specimen:  right mastectomy and axillary contents  DISPOSITION OF SPECIMEN:  PATHOLOGY  COUNTS:  YES  TOURNIQUET:  * No tourniquets in log *  DICTATION: .Dragon Dictation   After informed consent was obtained the patient was brought to the operating room and placed in the supine position on the operating table.  After adequate induction of general anesthesia the patient's right chest, breast, and axillary area were prepped with ChloraPrep, allowed to dry, and draped in usual sterile manner.  An appropriate timeout was performed.  Earlier in the day the patient underwent injection of 1 mCi of technetium sulfur colloid in the subareolar position on the right.  The neoprobe was set to technetium and an area of radioactivity was identified in the right axilla.  The patient had a large palpable tumor in the lateral right breast with some skin redness that was new since her visit in the clinic.  An elliptical incision was made with a 15 blade knife around the nipple and areola and around the palpable mass an area of redness in order to minimize the excess skin.  The incision was carried through the skin and subcutaneous tissue sharply with the plasma blade.  Breast hooks were used to elevate the skin flaps anteriorly towards the ceiling.  Thin skin flaps were then  created by dissecting with the plasma blade between the breast tissue and the subcutaneous fat.  This dissection was carried circumferentially all the way to the chest wall.  Superiorly the port was within the breast tissue and we did encounter the tubing for the port.  At this point Dr. Donnetta Hutching and vascular surgery came in and was able to remove the port tubing from the vein.  The area was hemostatic.  Next the breast was removed from the pectoralis muscle with the pectoralis fascia.  When the dissection reached the right axilla we were able to identify an area of radioactivity.  Unfortunately she also had some enlarged almost matted nodes that were palpable.  This was removed with the area of radioactivity sort of in an en bloc fashion.  When this was accomplished then it appeared as though we had essentially done a right axillary lymph node dissection.  The entire breast and axillary contents were then removed from the patient and sent to pathology for further evaluation.  Several small vessels and intercostal brachial nerves in the axilla were controlled with clips.  The wound was irrigated with copious amounts of saline.  Next 2 small stab incisions were made near the anterior axillary line inferior to the operative bed.  A tonsil clamp was used to bring a 19 [place] round Blake drains through each of these openings.  The lateral drain was placed in the axilla and the medial drain was curled along the chest wall.  The drains were  anchored to the skin with 3-0 nylon stitches.  Next the superior and inferior skin flaps were grossly reapproximated with interrupted 3-0 Vicryl stitches.  The skin was then closed with a running 4-0 Monocryl subcuticular stitch.  Dermabond dressings were applied as well as sterile dressings around the drain sites.  Patient tolerated the procedure well.  At the end of the case all needle sponge and instrument counts were correct.  The patient was then awakened and taken recovery in stable  condition.  PLAN OF CARE: Admit for overnight observation  PATIENT DISPOSITION:  PACU - hemodynamically stable.   Delay start of Pharmacological VTE agent (>24hrs) due to surgical blood loss or risk of bleeding: no

## 2019-07-10 ENCOUNTER — Encounter (HOSPITAL_COMMUNITY): Payer: Self-pay | Admitting: General Surgery

## 2019-07-10 DIAGNOSIS — C50411 Malignant neoplasm of upper-outer quadrant of right female breast: Secondary | ICD-10-CM | POA: Diagnosis not present

## 2019-07-10 MED ORDER — HYDROCODONE-ACETAMINOPHEN 5-325 MG PO TABS: 1.0000 | ORAL_TABLET | Freq: Four times a day (QID) | ORAL | 0 refills | Status: DC | PRN

## 2019-07-10 MED ORDER — METHOCARBAMOL 500 MG PO TABS: 500.0000 mg | ORAL_TABLET | Freq: Four times a day (QID) | ORAL | 1 refills | Status: DC | PRN

## 2019-07-10 MED ORDER — TRAMADOL HCL 50 MG PO TABS: 50.0000 mg | ORAL_TABLET | Freq: Four times a day (QID) | ORAL | 0 refills | Status: DC | PRN

## 2019-07-10 NOTE — Progress Notes (Signed)
1 Day Post-Op   Subjective/Chief Complaint: No complaints. Notes some burning on back of right arm   Objective: Vital signs in last 24 hours: Temp:  [97.5 F (36.4 C)-98 F (36.7 C)] 97.8 F (36.6 C) (08/14 0513) Pulse Rate:  [50-71] 53 (08/14 0513) Resp:  [11-20] 20 (08/14 0513) BP: (123-164)/(60-92) 123/60 (08/14 0513) SpO2:  [95 %-100 %] 97 % (08/14 0513) Last BM Date: 07/08/19  Intake/Output from previous day: 08/13 0701 - 08/14 0700 In: 1300.7 [P.O.:398; I.V.:802.7; IV Piggyback:100] Out: 275 [Drains:200; Blood:75] Intake/Output this shift: No intake/output data recorded.  General appearance: alert and cooperative Resp: clear to auscultation bilaterally Chest wall: skin flaps look good Cardio: regular rate and rhythm GI: soft, non-tender; bowel sounds normal; no masses,  no organomegaly  Lab Results:  No results for input(s): WBC, HGB, HCT, PLT in the last 72 hours. BMET No results for input(s): NA, K, CL, CO2, GLUCOSE, BUN, CREATININE, CALCIUM in the last 72 hours. PT/INR No results for input(s): LABPROT, INR in the last 72 hours. ABG No results for input(s): PHART, HCO3 in the last 72 hours.  Invalid input(s): PCO2, PO2  Studies/Results: Nm Sentinel Node Inj-no Rpt (breast)  Result Date: 07/09/2019 Sulfur colloid was injected by the nuclear medicine technologist for melanoma sentinel node.    Anti-infectives: Anti-infectives (From admission, onward)   Start     Dose/Rate Route Frequency Ordered Stop   07/09/19 0645  ceFAZolin (ANCEF) IVPB 2g/100 mL premix     2 g 200 mL/hr over 30 Minutes Intravenous On call to O.R. 07/09/19 0639 07/09/19 0826      Assessment/Plan: s/p Procedure(s): RIGHT MASTECTOMY WITH SENTINEL LYMPH NODE BIOPSY (Right) REMOVAL PORT-A-CATH (Right) Advance diet Discharge  LOS: 0 days    Autumn Messing III 07/10/2019

## 2019-07-10 NOTE — Plan of Care (Signed)
  Problem: Clinical Measurements: Goal: Ability to maintain clinical measurements within normal limits will improve Outcome: Progressing Goal: Postoperative complications will be avoided or minimized Outcome: Progressing   Problem: Skin Integrity: Goal: Demonstration of wound healing without infection will improve Outcome: Progressing   

## 2019-07-10 NOTE — Assessment & Plan Note (Addendum)
05/22/2019:Patient palpated a lump in the right breast. Mammogram showed 2.2cm mass in the UOQ, with no right axillary adenopathy. 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  Stage IIa  Treatment plan: 1.  Surgery with right mastectomy and sentinel lymph node biopsy 2.  Adjuvant chemotherapy with Brenton followed by Herceptin Perjeta maintenance (we also discussed neoadjuvant chemotherapy risks and benefits) 3.  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 Stage 2A  Pathology counseling: I discussed the final pathology report of the patient provided  a copy of this report. I discussed the margins as well as lymph node surgeries. We also discussed the final staging along with previously performed ER/PR and HER-2/neu testing.  UPBEAT clinical trial ([id number]): Newly diagnosed stage I to III breast cancer patients receiving either adjuvant or neoadjuvant chemotherapy undergo cardiac MRI before treatment and at 24 months along with neurocognitive testing, exercise and disability measures at baseline 3, 12 and 24 months.  S0123: Taxane neuropathy study Treatment plan: Return to clinic in 3 weeks for adjuvant chemotherapy with [facility] Perjeta

## 2019-07-13 ENCOUNTER — Other Ambulatory Visit: Payer: Self-pay | Admitting: Radiology

## 2019-07-14 ENCOUNTER — Other Ambulatory Visit: Payer: Self-pay

## 2019-07-14 ENCOUNTER — Encounter (HOSPITAL_COMMUNITY): Payer: Self-pay

## 2019-07-14 ENCOUNTER — Ambulatory Visit (HOSPITAL_COMMUNITY)
Admission: RE | Admit: 2019-07-14 | Discharge: 2019-07-14 | Disposition: A | Discharge status: Home / Self Care | Payer: Medicare Other | Source: Ambulatory Visit | Attending: General Surgery | Admitting: General Surgery

## 2019-07-14 DIAGNOSIS — Z170 Estrogen receptor positive status [ER+]: Secondary | ICD-10-CM | POA: Diagnosis not present

## 2019-07-14 DIAGNOSIS — Z87891 Personal history of nicotine dependence: Secondary | ICD-10-CM | POA: Diagnosis not present

## 2019-07-14 DIAGNOSIS — Z79899 Other long term (current) drug therapy: Secondary | ICD-10-CM | POA: Diagnosis not present

## 2019-07-14 DIAGNOSIS — I10 Essential (primary) hypertension: Secondary | ICD-10-CM | POA: Insufficient documentation

## 2019-07-14 DIAGNOSIS — C50411 Malignant neoplasm of upper-outer quadrant of right female breast: Secondary | ICD-10-CM | POA: Insufficient documentation

## 2019-07-14 DIAGNOSIS — Z9221 Personal history of antineoplastic chemotherapy: Secondary | ICD-10-CM | POA: Insufficient documentation

## 2019-07-14 DIAGNOSIS — Z7982 Long term (current) use of aspirin: Secondary | ICD-10-CM | POA: Insufficient documentation

## 2019-07-14 HISTORY — PX: IR IMAGING GUIDED PORT INSERTION: IMG5740

## 2019-07-14 LAB — CBC
HCT: 42.1 % (ref 36.0–46.0)
Hemoglobin: 14.6 g/dL (ref 12.0–15.0)
MCH: 31 pg (ref 26.0–34.0)
MCHC: 34.7 g/dL (ref 30.0–36.0)
MCV: 89.4 fL (ref 80.0–100.0)
Platelets: 314 10*3/uL (ref 150–400)
RBC: 4.71 MIL/uL (ref 3.87–5.11)
RDW: 12.5 % (ref 11.5–15.5)
WBC: 7 10*3/uL (ref 4.0–10.5)
nRBC: 0 % (ref 0.0–0.2)

## 2019-07-14 LAB — BASIC METABOLIC PANEL
Anion gap: 13 (ref 5–15)
BUN: 13 mg/dL (ref 8–23)
CO2: 24 mmol/L (ref 22–32)
Calcium: 9.7 mg/dL (ref 8.9–10.3)
Chloride: 100 mmol/L (ref 98–111)
Creatinine, Ser: 0.75 mg/dL (ref 0.44–1.00)
GFR calc Af Amer: >60 mL/min (ref >60)
GFR calc non Af Amer: >60 mL/min (ref >60)
Glucose, Bld: 122 mg/dL — ABNORMAL HIGH (ref 70–99)
Potassium: 3.7 mmol/L (ref 3.5–5.1)
Sodium: 137 mmol/L (ref 135–145)

## 2019-07-14 LAB — PROTIME-INR
INR: 0.9 (ref 0.8–1.2)
Prothrombin Time: 11.9 seconds (ref 11.4–15.2)

## 2019-07-14 MED ORDER — CEFAZOLIN SODIUM-DEXTROSE 2-4 GM/100ML-% IV SOLN
2.0000 g | INTRAVENOUS | Status: AC
  Administered 2019-07-14: 2 g via INTRAVENOUS

## 2019-07-14 MED ORDER — CEFAZOLIN SODIUM-DEXTROSE 2-4 GM/100ML-% IV SOLN
INTRAVENOUS | Status: AC
  Administered 2019-07-14: 12:00:00 2 g via INTRAVENOUS
  Filled 2019-07-14: qty 100

## 2019-07-14 MED ORDER — HEPARIN SOD (PORK) LOCK FLUSH 100 UNIT/ML IV SOLN
INTRAVENOUS | Status: AC
  Filled 2019-07-14: qty 5

## 2019-07-14 MED ORDER — FENTANYL CITRATE (PF) 100 MCG/2ML IJ SOLN
INTRAMUSCULAR | Status: AC | PRN
  Administered 2019-07-14: 25 ug via INTRAVENOUS
  Administered 2019-07-14: 50 ug via INTRAVENOUS

## 2019-07-14 MED ORDER — LIDOCAINE HCL 1 % IJ SOLN
INTRAMUSCULAR | Status: AC
  Filled 2019-07-14: qty 20

## 2019-07-14 MED ORDER — MIDAZOLAM HCL 2 MG/2ML IJ SOLN
1.0000 mg | Freq: Once | INTRAMUSCULAR | Status: AC
  Administered 2019-07-14: 12:00:00 1 mg via INTRAVENOUS

## 2019-07-14 MED ORDER — SODIUM CHLORIDE 0.9 % IV SOLN: INTRAVENOUS | Status: DC

## 2019-07-14 MED ORDER — FENTANYL CITRATE (PF) 100 MCG/2ML IJ SOLN
INTRAMUSCULAR | Status: AC
  Filled 2019-07-14: qty 2

## 2019-07-14 MED ORDER — HEPARIN SOD (PORK) LOCK FLUSH 100 UNIT/ML IV SOLN
INTRAVENOUS | Status: AC | PRN
  Administered 2019-07-14 (×2): 500 [IU] via INTRAVENOUS

## 2019-07-14 MED ORDER — LIDOCAINE HCL 1 % IJ SOLN
INTRAMUSCULAR | Status: AC | PRN
  Administered 2019-07-14: 8 mL

## 2019-07-14 MED ORDER — MIDAZOLAM HCL 2 MG/2ML IJ SOLN
INTRAMUSCULAR | Status: AC | PRN
  Administered 2019-07-14: 1 mg via INTRAVENOUS

## 2019-07-14 MED ORDER — MIDAZOLAM HCL 2 MG/2ML IJ SOLN
INTRAMUSCULAR | Status: AC
  Administered 2019-07-14: 12:00:00 1 mg via INTRAVENOUS
  Filled 2019-07-14: qty 2

## 2019-07-14 NOTE — Sedation Documentation (Signed)
[place] Agitation Sedation Scale (RASS): 0 (calm)

## 2019-07-14 NOTE — Procedures (Signed)
Interventional Radiology Procedure Note  Procedure: Placement of a left IJ approach single lumen PowerPort.  Tip is positioned at the superior cavoatrial junction and catheter is ready for immediate use.  Complications: None Recommendations:  - Ok to shower tomorrow - Do not submerge for 7 days - Routine line care   Signed,  Dre Gamino S. Clemente Dewey, DO    

## 2019-07-14 NOTE — H&P (Signed)
Chief Complaint: Patient was seen in consultation today for [facility] a Cath placement at the request of Toth,Paul III  Referring Physician(s): Toth,Paul III  Supervising Physician: Corrie Mckusick  Patient Status: [facility] - Out-pt  History of Present Illness: Marisa Spears is a 68 y.o. female   Hx Breast Ca 28 yrs ago (left) New breast cancer diagnosis recently (right) Mastectomy 07/09/19 with Dr [name] [facility] removal same day [facility] had been in place 28 yrs)  Now scheduled for new PAC placement in IR   Past Medical History:  Diagnosis Date  . Arthritis   . Family history of breast cancer   . Hypertension   . Personal history of chemotherapy   . PONV (postoperative nausea and vomiting)     Past Surgical History:  Procedure Laterality Date  . APPENDECTOMY    . MASTECTOMY Left   . MASTECTOMY W/ SENTINEL NODE BIOPSY Right 07/09/2019   Procedure: RIGHT MASTECTOMY WITH SENTINEL LYMPH NODE BIOPSY;  Surgeon: [name], MD;  Location: [place];  Service: General;  Laterality: Right;  . PORT-A-CATH REMOVAL Right 07/09/2019   Procedure: REMOVAL PORT-A-CATH;  Surgeon: [name], MD;  Location: [place];  Service: General;  Laterality: Right;  . TONSILLECTOMY      Allergies: Codeine  Medications: Prior to Admission medications   Medication Sig Start Date End Date Taking? Authorizing Provider  aspirin EC 81 MG tablet Take 81 mg by mouth at bedtime.    Yes [provider]  Biotin w/ Vitamins C & E (HAIR/SKIN/NAILS PO) Take 2 tablets by mouth daily.   Yes [provider]  Calcium Carb-Cholecalciferol (CALCIUM 600+D3 PO) Take 1 tablet by mouth daily.   Yes [provider]  Cholecalciferol (EQL VITAMIN D3) 50 MCG (2000 UT) CAPS Take 4,000 Units by mouth daily.   Yes [provider]  methocarbamol (ROBAXIN) 500 MG tablet Take 1 tablet (500 mg total) by mouth every 6 (six) hours as needed for muscle spasms. 07/10/19  Yes [name], MD  Omega-3 Fatty  Acids (FISH OIL ULTRA) 1400 MG CAPS Take 1,400 mg by mouth 2 (two) times daily.   Yes [provider]  pravastatin (PRAVACHOL) 40 MG tablet Take 40 mg by mouth at bedtime.    Yes [provider]  triamterene-hydrochlorothiazide (MAXZIDE-25) 37.5-25 MG tablet Take 1 tablet by mouth daily.    Yes [provider]  HYDROcodone-acetaminophen (NORCO/VICODIN) 5-325 MG tablet Take 1-2 tablets by mouth every 6 (six) hours as needed for moderate pain. 07/10/19   [name], MD  traMADol (ULTRAM) 50 MG tablet Take 1 tablet (50 mg total) by mouth every 6 (six) hours as needed (mild pain). 07/10/19   [name], MD     Family History  Problem Relation Age of Onset  . Breast cancer Sister     Social History   Socioeconomic History  . Marital status: Married    Spouse name: Not on file  . Number of children: Not on file  . Years of education: Not on file  . Highest education level: Not on file  Occupational History  . Not on file  Social Needs  . Financial resource strain: Not on file  . Food insecurity    Worry: Not on file    Inability: Not on file  . Transportation needs    Medical: Not on file    Non-medical: Not on file  Tobacco Use  . Smoking status: Former Smoker    Types:  Cigarettes    Quit date: 2013    Years since quitting: 7.6  . Smokeless tobacco: Never Used  Substance and Sexual Activity  . Alcohol use: Not Currently  . Drug use: Not Currently  . Sexual activity: Not on file  Lifestyle  . Physical activity    Days per week: Not on file    Minutes per session: Not on file  . Stress: Not on file  Relationships  . Social Herbalist on phone: Not on file    Gets together: Not on file    Attends religious service: Not on file    Active member of club or organization: Not on file    Attends meetings of clubs or organizations: Not on file    Relationship status: Not on file  Other Topics Concern  . Not on file  Social History  Narrative  . Not on file     Review of Systems: A 12 point ROS discussed and pertinent positives are indicated in the HPI above.  All other systems are negative.  Review of Systems  Constitutional: Negative for activity change, fatigue and fever.  Respiratory: Negative for cough and shortness of breath.   Gastrointestinal: Negative for abdominal pain.  Psychiatric/Behavioral: Negative for behavioral problems and confusion.    Vital Signs: BP (!) 145/110 (BP Location: Right Leg)   Pulse 69   Temp 98 F (36.7 C) (Oral)   Resp 15   Ht 5\' 9"  (1.753 m)   Wt 199 lb (90.3 kg)   SpO2 98%   BMI 29.39 kg/m   Physical Exam Vitals signs reviewed.  Cardiovascular:     Rate and Rhythm: Normal rate and regular rhythm.     Heart sounds: Normal heart sounds.  Pulmonary:     Breath sounds: Normal breath sounds.  Abdominal:     Palpations: Abdomen is soft.  Musculoskeletal: Normal range of motion.  Skin:    General: Skin is warm and dry.     Comments: Recent right mastectomy 07/09/19  Neurological:     Mental Status: She is alert and oriented to person, place, and time.  Psychiatric:        Mood and Affect: Mood normal.        Behavior: Behavior normal.        Thought Content: Thought content normal.        Judgment: Judgment normal.     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    Labs:  CBC: Recent Labs    05/27/19 1235 07/03/19 0916 07/14/19 1021  WBC 6.8 7.7 7.0  HGB 13.2 13.0 14.6  HCT 38.5 38.5 42.1  PLT 245 242 314    COAGS: Recent Labs    07/14/19 1021  INR 0.9    BMP: Recent Labs    05/27/19 1235 07/03/19 0916 07/14/19 1021  NA 138 139 137  K 3.8 4.1 3.7  CL 105 102 100  CO2 24 27 24   GLUCOSE 123* 109* 122*  BUN 13 9 13   CALCIUM 9.6 9.8 9.7  CREATININE 0.89 0.74 0.75  GFRNONAA >60 >60 >60  GFRAA >60 >60 >60     LIVER FUNCTION TESTS: Recent Labs    05/27/19 1235  BILITOT 1.0  AST 15  ALT 11  ALKPHOS 64  PROT 7.3  ALBUMIN 4.1    TUMOR MARKERS: No results for input(s): AFPTM, CEA,  CA199, CHROMGRNA in the last 8760 hours.  Assessment and Plan:  Right Breast cancer For [facility] placement today Dr Lindi Adie visit this week for plan Risks and benefits of image guided port-a-catheter placement was discussed with the patient including, but not limited to bleeding, infection, pneumothorax, or fibrin sheath development and need for additional procedures.  All of the patient's questions were answered, patient is agreeable to proceed. Consent signed and in chart.  Thank you for this interesting consult.  I greatly enjoyed meeting Marisa Spears and look forward to participating in their care.  A copy of this report was sent to the requesting provider on this date.  Electronically Signed: Lavonia Drafts, PA-C 07/14/2019, 11:26 AM   I spent a total of  30 Minutes   in face to face in clinical consultation, greater than 50% of which was counseling/coordinating care for [facility]

## 2019-07-14 NOTE — Sedation Documentation (Signed)
Pt in IR Room 1, supine on table, secured with strap.  Pt placed on 2L O2 via [place].  Pt on cont cardiac monitoring 

## 2019-07-14 NOTE — Discharge Instructions (Signed)
Implanted Port Insertion, Care After °This sheet gives you information about how to care for yourself after your procedure. Your health care provider may also give you more specific instructions. If you have problems or questions, contact your health care provider. °What can I expect after the procedure? °After the procedure, it is common to have: °· Discomfort at the port insertion site. °· Bruising on the skin over the port. This should improve over 3-4 days. °Follow these instructions at home: °Port care °· After your port is placed, you will get a manufacturer's information card. The card has information about your port. Keep this card with you at all times. °· Take care of the port as told by your health care provider. Ask your health care provider if you or a family member can get training for taking care of the port at home. A home health care nurse may also take care of the port. °· Make sure to remember what type of port you have. °Incision care ° °  ° °· Follow instructions from your health care provider about how to take care of your port insertion site. Make sure you: °? Wash your hands with soap and water before and after you change your bandage (dressing). If soap and water are not available, use hand sanitizer. °? Change your dressing as told by your health care provider. °? Leave stitches (sutures), skin glue, or adhesive strips in place. These skin closures may need to stay in place for 2 weeks or longer. If adhesive strip edges start to loosen and curl up, you may trim the loose edges. Do not remove adhesive strips completely unless your health care provider tells you to do that. °· Check your port insertion site every day for signs of infection. Check for: °? Redness, swelling, or pain. °? Fluid or blood. °? Warmth. °? Pus or a bad smell. °Activity °· Return to your normal activities as told by your health care provider. Ask your health care provider what activities are safe for you. °· Do not  lift anything that is heavier than 10 lb (4.5 kg), or the limit that you are told, until your health care provider says that it is safe. °General instructions °· Take over-the-counter and prescription medicines only as told by your health care provider. °· Do not take baths, swim, or use a hot tub until your health care provider approves. Ask your health care provider if you may take showers. You may only be allowed to take sponge baths. °· Do not drive for 24 hours if you were given a sedative during your procedure. °· Wear a medical alert bracelet in case of an emergency. This will tell any health care providers that you have a port. °· Keep all follow-up visits as told by your health care provider. This is important. °Contact a health care provider if: °· You cannot flush your port with saline as directed, or you cannot draw blood from the port. °· You have a fever or chills. °· You have redness, swelling, or pain around your port insertion site. °· You have fluid or blood coming from your port insertion site. °· Your port insertion site feels warm to the touch. °· You have pus or a bad smell coming from the port insertion site. °Get help right away if: °· You have chest pain or shortness of breath. °· You have bleeding from your port that you cannot control. °Summary °· Take care of the port as told by your health   care provider. Keep the manufacturer's information card with you at all times.  Change your dressing as told by your health care provider.  Contact a health care provider if you have a fever or chills or if you have redness, swelling, or pain around your port insertion site.  Keep all follow-up visits as told by your health care provider. This information is not intended to replace advice given to you by your health care provider. Make sure you discuss any questions you have with your health care provider. Document Released: 09/02/2013 Document Revised: 06/10/2018 Document Reviewed:  06/10/2018 Elsevier Patient Education  [place]. Moderate Conscious Sedation, Adult, Care After These instructions provide you with information about caring for yourself after your procedure. Your health care provider may also give you more specific instructions. Your treatment has been planned according to current medical practices, but problems sometimes occur. Call your health care provider if you have any problems or questions after your procedure. What can I expect after the procedure? After your procedure, it is common:  To feel sleepy for several hours.  To feel clumsy and have poor balance for several hours.  To have poor judgment for several hours.  To vomit if you eat too soon. Follow these instructions at home: For at least 24 hours after the procedure:   Do not: ? Participate in activities where you could fall or become injured. ? Drive. ? Use heavy machinery. ? Drink alcohol. ? Take sleeping pills or medicines that cause drowsiness. ? Make important decisions or sign legal documents. ? Take care of children on your own.  Rest. Eating and drinking  Follow the diet recommended by your health care provider.  If you vomit: ? Drink water, juice, or soup when you can drink without vomiting. ? Make sure you have little or no nausea before eating solid foods. General instructions  Have a responsible adult stay with you until you are awake and alert.  Take over-the-counter and prescription medicines only as told by your health care provider.  If you smoke, do not smoke without supervision.  Keep all follow-up visits as told by your health care provider. This is important. Contact a health care provider if:  You keep feeling nauseous or you keep vomiting.  You feel light-headed.  You develop a rash.  You have a fever. Get help right away if:  You have trouble breathing. This information is not intended to replace advice given to you by your health  care provider. Make sure you discuss any questions you have with your health care provider. Document Released: 09/02/2013 Document Revised: 10/25/2017 Document Reviewed: 03/03/2016 Elsevier Patient Education  [place] An implanted port is a device that is placed under the skin. It is usually placed in the chest. The device can be used to give IV medicine, to take blood, or for dialysis. You may have an implanted port if:  You need IV medicine that would be irritating to the small veins in your hands or arms.  You need IV medicines, such as antibiotics, for a long period of time.  You need IV nutrition for a long period of time.  You need dialysis. Having a port means that your health care provider will not need to use the veins in your arms for these procedures. You may have fewer limitations when using a port than you would if you used other types of long-term IVs, and you will likely be able  to return to normal activities after your incision heals. An implanted port has two main parts:  Reservoir. The reservoir is the part where a needle is inserted to give medicines or draw blood. The reservoir is round. After it is placed, it appears as a small, raised area under your skin.  Catheter. The catheter is a thin, flexible tube that connects the reservoir to a vein. Medicine that is inserted into the reservoir goes into the catheter and then into the vein. How is my port accessed? To access your port:  A numbing cream may be placed on the skin over the port site.  Your health care provider will put on a mask and sterile gloves.  The skin over your port will be cleaned carefully with a germ-killing soap and allowed to dry.  Your health care provider will gently pinch the port and insert a needle into it.  Your health care provider will check for a blood return to make sure the port is in the vein and is not clogged.  If your port needs to remain  accessed to get medicine continuously (constant infusion), your health care provider will place a clear bandage (dressing) over the needle site. The dressing and needle will need to be changed every week, or as told by your health care provider. What is flushing? Flushing helps keep the port from getting clogged. Follow instructions from your health care provider about how and when to flush the port. Ports are usually flushed with saline solution or a medicine called heparin. The need for flushing will depend on how the port is used:  If the port is only used from time to time to give medicines or draw blood, the port may need to be flushed: ? Before and after medicines have been given. ? Before and after blood has been drawn. ? As part of routine maintenance. Flushing may be recommended every 4-6 weeks.  If a constant infusion is running, the port may not need to be flushed.  Throw away any syringes in a disposal container that is meant for sharp items (sharps container). You can buy a sharps container from a pharmacy, or you can make one by using an empty hard plastic bottle with a cover. How long will my port stay implanted? The port can stay in for as long as your health care provider thinks it is needed. When it is time for the port to come out, a surgery will be done to remove it. The surgery will be similar to the procedure that was done to put the port in. Follow these instructions at home:   Flush your port as told by your health care provider.  If you need an infusion over several days, follow instructions from your health care provider about how to take care of your port site. Make sure you: ? Wash your hands with soap and water before you change your dressing. If soap and water are not available, use alcohol-based hand sanitizer. ? Change your dressing as told by your health care provider. ? Place any used dressings or infusion bags into a plastic bag. Throw that bag in the  trash. ? Keep the dressing that covers the needle clean and dry. Do not get it wet. ? Do not use scissors or sharp objects near the tube. ? Keep the tube clamped, unless it is being used.  Check your port site every day for signs of infection. Check for: ? Redness, swelling, or pain. ? Fluid or  blood. ? Pus or a bad smell.  Protect the skin around the port site. ? Avoid wearing bra straps that rub or irritate the site. ? Protect the skin around your port from seat belts. Place a soft pad over your chest if needed.  Bathe or shower as told by your health care provider. The site may get wet as long as you are not actively receiving an infusion.  Return to your normal activities as told by your health care provider. Ask your health care provider what activities are safe for you.  Carry a medical alert card or wear a medical alert bracelet at all times. This will let health care providers know that you have an implanted port in case of an emergency. Get help right away if:  You have redness, swelling, or pain at the port site.  You have fluid or blood coming from your port site.  You have pus or a bad smell coming from the port site.  You have a fever. Summary  Implanted ports are usually placed in the chest for long-term IV access.  Follow instructions from your health care provider about flushing the port and changing bandages (dressings).  Take care of the area around your port by avoiding clothing that puts pressure on the area, and by watching for signs of infection.  Protect the skin around your port from seat belts. Place a soft pad over your chest if needed.  Get help right away if you have a fever or you have redness, swelling, pain, drainage, or a bad smell at the port site. This information is not intended to replace advice given to you by your health care provider. Make sure you discuss any questions you have with your health care provider. Document Released:  11/12/2005 Document Revised: 03/06/2019 Document Reviewed: 12/15/2016 Elsevier Patient Education  2020 [employer].

## 2019-07-16 ENCOUNTER — Telehealth: Payer: Self-pay | Admitting: Hematology and Oncology

## 2019-07-16 ENCOUNTER — Encounter: Payer: Self-pay | Admitting: *Deleted

## 2019-07-16 NOTE — Progress Notes (Signed)
HEMATOLOGY-ONCOLOGY DOXIMITY VISIT PROGRESS NOTE  I connected with Lesle Chris on 07/17/2019 at 11:30 AM EDT by Doximity video conference and verified that I am speaking with the correct person using two identifiers.  I discussed the limitations, risks, security and privacy concerns of performing an evaluation and management service by Doximity and the availability of in person appointments.  I also discussed with the patient that there may be a patient responsible charge related to this service. The patient expressed understanding and agreed to proceed.  Patient's Location: Home Physician Location: Clinic  CHIEF COMPLIANT: Follow-up s/p right mastectomy to review pathology  INTERVAL HISTORY: Marisa Spears is a 68 y.o. 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 She underwent a right mastectomy on 07/09/19 with Dr. 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 She presents over Doximity today to review the pathology report and discuss further treatment.   Oncology History  Malignant neoplasm of upper-outer quadrant of right breast in female, estrogen receptor positive ([place])  1992 Miscellaneous   Left mastectomy followed by adjuvant chemotherapy   05/22/2019 Initial Diagnosis   Patient palpated a lump in the right breast. Mammogram showed 2.2cm mass in the UOQ, with no right axillary adenopathy. 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 The Common Hereditary Cancers Panel offered by [facility] includes sequencing and/or deletion duplication testing of the following 48 genes: APC, ATM, AXIN2, BARD1, BMPR1A, BRCA1, BRCA2, BRIP1, CDH1, CDKN2A (p14ARF), CDKN2A (p16INK4a), CKD4, CHEK2, CTNNA1, DICER1, EPCAM (Deletion/duplication testing only), GREM1 (promoter region deletion/duplication testing only), KIT, MEN1, MLH1, MSH2, MSH3, MSH6, MUTYH, NBN, NF1, NHTL1, PALB2, PDGFRA, PMS2, POLD1, POLE, PTEN, RAD50, RAD51C, RAD51D, RNF43, SDHB, SDHC, SDHD, SMAD4, SMARCA4. STK11, TP53, TSC1, TSC2, and VHL.  The following genes were evaluated for sequence changes only: SDHA and HOXB13 c.251G>A variant only. The report date is 06/18/2019.   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   07/17/2019 Cancer Staging   Staging form: Breast, AJCC 8th Edition - Pathologic: Stage IIA (pT2, pN0, cM0, G3, ER+, PR-, HER2+) - Signed by [name], MD on 07/17/2019     REVIEW OF SYSTEMS:   Constitutional: Denies fevers, chills or abnormal weight loss Eyes: Denies blurriness of vision Ears, nose, mouth, throat, and face: Denies mucositis or sore throat Respiratory: Denies cough, dyspnea or wheezes Cardiovascular: Denies palpitation, chest discomfort Gastrointestinal:  Denies nausea, heartburn or change in bowel habits Skin: Denies abnormal skin rashes Lymphatics: Denies new lymphadenopathy or easy bruising Neurological:Denies numbness, tingling or new weaknesses Behavioral/Psych: Mood is stable, no new changes  Extremities: No lower extremity edema Breast: Market discomfort due to mastectomy and the presence of drainage tube All other systems were reviewed with the patient and are negative.  Observations/Objective:  There were no vitals filed for this visit. There is no height or weight on file to calculate BMI.  I have reviewed  the data as listed CMP Latest  Ref Rng & Units 07/14/2019 07/03/2019 05/27/2019  Glucose 70 - 99 mg/dL 122(H) 109(H) 123(H)  BUN 8 - 23 mg/dL _0 Creatinine 0.44 - 1.00 mg/dL 0.75 0.74 0.89  Sodium 135 - 145 mmol/L 137 139 138  Potassium 3.5 - 5.1 mmol/L 3.7 4.1 3.8  Chloride 98 - 111 mmol/L 100 102 105  CO2 22 - 32 mmol/L _1 Calcium 8.9 - 10.3 mg/dL 9.7 9.8 9.6  Total Protein 6.5 - 8.1 g/dL - - 7.3  Total Bilirubin 0.3 - 1.2 mg/dL - - 1.0  Alkaline Phos 38 - 126 U/L - - 64  AST 15 - 41 U/L - - 15  ALT 0 - 44 U/L - - 11    Lab Results  Component Value Date   WBC 7.0 07/14/2019   HGB 14.6 07/14/2019   HCT 42.1 07/14/2019   MCV 89.4 07/14/2019   PLT 314 07/14/2019   NEUTROABS 4.6 05/27/2019      Assessment Plan:  Malignant neoplasm of upper-outer quadrant of right breast in female, estrogen receptor positive ([place]) 05/22/2019:Patient palpated a lump in the right breast. Mammogram showed 2.2cm mass in the UOQ, with no right axillary adenopathy. 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  Stage IIa  Treatment plan: 1.  Surgery with right mastectomy and sentinel lymph node biopsy 2.  Adjuvant chemotherapy with [place] followed by Herceptin Perjeta maintenance (we also discussed neoadjuvant chemotherapy risks and benefits) 3.  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 Stage 2A  Pathology counseling: I discussed the final pathology report of the patient provided  a copy of this report. I discussed the margins as well as lymph node surgeries. We also discussed the final staging along  with previously performed ER/PR and HER-2/neu testing.   I0165: Taxane neuropathy study.  Patient is interested in the study. We discussed upbeat clinical trial but she is claustrophobic and cannot do MRIs.  Treatment plan: Return to clinic in 3 weeks for adjuvant chemotherapy with [facility] Perjeta  I discussed the assessment and treatment plan with the patient. The patient was provided an opportunity to ask questions and all were answered. The patient agreed with the plan and demonstrated an understanding of the instructions. The patient was advised to call back or seek an in-person evaluation if the symptoms worsen or if the condition fails to improve as anticipated.   I provided 30 minutes of face-to-face Doximity time during this encounter.    Rulon Eisenmenger, MD 07/17/2019   I, Molly Dorshimer, am acting as scribe for [name], MD.  I have reviewed the above documentation for accuracy and completeness, and I agree with the above.

## 2019-07-16 NOTE — Telephone Encounter (Signed)
Confirmed appt and verified info. °

## 2019-07-17 ENCOUNTER — Other Ambulatory Visit: Payer: Self-pay

## 2019-07-17 ENCOUNTER — Inpatient Hospital Stay: Payer: Medicare Other | Attending: Hematology and Oncology | Admitting: Hematology and Oncology

## 2019-07-17 DIAGNOSIS — C50411 Malignant neoplasm of upper-outer quadrant of right female breast: Secondary | ICD-10-CM | POA: Insufficient documentation

## 2019-07-17 DIAGNOSIS — Z170 Estrogen receptor positive status [ER+]: Secondary | ICD-10-CM | POA: Diagnosis not present

## 2019-07-17 DIAGNOSIS — Z9012 Acquired absence of left breast and nipple: Secondary | ICD-10-CM | POA: Diagnosis not present

## 2019-07-17 MED ORDER — LORAZEPAM 0.5 MG PO TABS: 0.5000 mg | ORAL_TABLET | Freq: Every evening | ORAL | 0 refills | Status: DC | PRN

## 2019-07-17 MED ORDER — LIDOCAINE-PRILOCAINE 2.5-2.5 % EX CREA: TOPICAL_CREAM | CUTANEOUS | 3 refills | Status: DC

## 2019-07-17 MED ORDER — ONDANSETRON HCL 8 MG PO TABS: 8.0000 mg | ORAL_TABLET | Freq: Two times a day (BID) | ORAL | 1 refills | Status: DC | PRN

## 2019-07-17 MED ORDER — DEXAMETHASONE 4 MG PO TABS: 4.0000 mg | ORAL_TABLET | Freq: Every day | ORAL | 0 refills | Status: AC

## 2019-07-17 MED ORDER — PROCHLORPERAZINE MALEATE 10 MG PO TABS: 10.0000 mg | ORAL_TABLET | Freq: Four times a day (QID) | ORAL | 1 refills | Status: DC | PRN

## 2019-07-17 NOTE — Progress Notes (Signed)
Orders placed for echocardiogram.  Research notified to contact pt regarding S1714 study.  Scheduling message sent to schedule chemo education, and upcoming chemotherapy infusions.

## 2019-07-20 ENCOUNTER — Telehealth: Payer: Self-pay | Admitting: Hematology and Oncology

## 2019-07-20 NOTE — Telephone Encounter (Signed)
I talk with patient regarding schedule  

## 2019-07-21 ENCOUNTER — Telehealth: Payer: Self-pay | Admitting: *Deleted

## 2019-07-21 NOTE — Telephone Encounter (Signed)
SWOG [id number], Neuropathy Study:  Dr. Lindi Adie referred patient for the [phone] study.  Called patient and introduced myself and briefly explained the purpose of this study.  Informed patient of study activities, including meeting with research nurse to consent prior to starting treatment.  Asked patient if she would like to meet with research nurse to review the study and consent between her appointments (Echo and Chemo Education) on 9/1. Patient stated she has appt with Dr. [name] in between those appointments. After discussing, patient stated she is really just feeling too overwhelmed right now to think about anything else. Patient declined participation in this study.  Thanked patient for her time and consideration of this study. Dr. Lindi Adie notified.  DCP-001 Use of a Clinical Trial Screening Tool to Address Cancer Health Disparities in the [place] Program: Informed patient of the DCP-001 study with brief overview of study. Informed patient that participation is voluntary and involves a one time consent and collection of demographic variables, with the majority of data collected from their medical record. Informed patient this can be done with research nurse during her chemotherapy appointment so it would not take any extra time.  Patient said she is willing to speak with research nurse about it during her chemotherapy appointment.  Thanked patient for her time and plan to meet with her in infusion room on 08/10/19.  Foye Spurling, BSN, RN Clinical Research Nurse 07/21/2019 12:49 PM

## 2019-07-23 ENCOUNTER — Other Ambulatory Visit (HOSPITAL_COMMUNITY): Payer: Medicare Other

## 2019-07-28 ENCOUNTER — Other Ambulatory Visit: Payer: Self-pay

## 2019-07-28 ENCOUNTER — Inpatient Hospital Stay: Payer: Medicare Other | Attending: Hematology and Oncology

## 2019-07-28 ENCOUNTER — Ambulatory Visit (HOSPITAL_COMMUNITY)
Admission: RE | Admit: 2019-07-28 | Discharge: 2019-07-28 | Disposition: A | Discharge status: Home / Self Care | Payer: Medicare Other | Source: Ambulatory Visit | Attending: Hematology and Oncology | Admitting: Hematology and Oncology

## 2019-07-28 DIAGNOSIS — Z9013 Acquired absence of bilateral breasts and nipples: Secondary | ICD-10-CM | POA: Insufficient documentation

## 2019-07-28 DIAGNOSIS — I10 Essential (primary) hypertension: Secondary | ICD-10-CM | POA: Diagnosis not present

## 2019-07-28 DIAGNOSIS — Z79899 Other long term (current) drug therapy: Secondary | ICD-10-CM | POA: Insufficient documentation

## 2019-07-28 DIAGNOSIS — C50411 Malignant neoplasm of upper-outer quadrant of right female breast: Secondary | ICD-10-CM | POA: Insufficient documentation

## 2019-07-28 DIAGNOSIS — Z5189 Encounter for other specified aftercare: Secondary | ICD-10-CM | POA: Insufficient documentation

## 2019-07-28 DIAGNOSIS — Z01818 Encounter for other preprocedural examination: Secondary | ICD-10-CM | POA: Diagnosis not present

## 2019-07-28 DIAGNOSIS — I071 Rheumatic tricuspid insufficiency: Secondary | ICD-10-CM | POA: Diagnosis not present

## 2019-07-28 DIAGNOSIS — Z5112 Encounter for antineoplastic immunotherapy: Secondary | ICD-10-CM | POA: Insufficient documentation

## 2019-07-28 DIAGNOSIS — Z5111 Encounter for antineoplastic chemotherapy: Secondary | ICD-10-CM | POA: Insufficient documentation

## 2019-07-28 DIAGNOSIS — Z170 Estrogen receptor positive status [ER+]: Secondary | ICD-10-CM | POA: Diagnosis not present

## 2019-07-28 NOTE — Progress Notes (Signed)
Echocardiogram 2D Echocardiogram has been performed.  Marisa Spears 07/28/2019, 9:46 AM

## 2019-08-05 ENCOUNTER — Other Ambulatory Visit: Payer: Medicare Other

## 2019-08-06 ENCOUNTER — Encounter: Payer: Medicare Other | Admitting: Physical Therapy

## 2019-08-09 NOTE — Progress Notes (Signed)
Patient Care Team: Earney Mallet, MD as PCP - General (Family Medicine) [name], RN as Oncology Nurse Navigator Rockwell Germany, RN as Oncology Nurse Navigator [name], MD as Consulting Physician (Hematology and Oncology) [name], MD as Consulting Physician (General Surgery) [name], MD as Attending Physician (Radiation Oncology)  DIAGNOSIS:    ICD-10-CM   1. Malignant neoplasm of upper-outer quadrant of right breast in female, estrogen receptor positive ([place])  C50.411    Z17.0     SUMMARY OF ONCOLOGIC HISTORY: Oncology History  Malignant neoplasm of upper-outer quadrant of right breast in female, estrogen receptor positive ([place])  1992 Miscellaneous   Left mastectomy followed by adjuvant chemotherapy   05/22/2019 Initial Diagnosis   Patient palpated a lump in the right breast. Mammogram showed 2.2cm mass in the UOQ, with no right axillary adenopathy. 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 The Common Hereditary Cancers Panel offered by [facility] includes sequencing and/or deletion duplication testing of the following 48 genes: APC, ATM, AXIN2, BARD1, BMPR1A, BRCA1, BRCA2, BRIP1, CDH1, CDKN2A (p14ARF), CDKN2A (p16INK4a), CKD4, CHEK2, CTNNA1, DICER1, EPCAM (Deletion/duplication testing only), GREM1 (promoter region deletion/duplication testing only), KIT, MEN1, MLH1, MSH2, MSH3, MSH6, MUTYH, NBN, NF1, NHTL1, PALB2, PDGFRA, PMS2, POLD1, POLE, PTEN, RAD50, RAD51C, RAD51D, RNF43, SDHB, SDHC, SDHD, SMAD4, SMARCA4. STK11, TP53, TSC1,  TSC2, and VHL.  The following genes were evaluated for sequence changes only: SDHA and HOXB13 c.251G>A variant only. The report date is 06/18/2019.   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   07/17/2019 Cancer Staging   Staging form: Breast, AJCC 8th Edition - Pathologic: Stage IIA (pT2, pN0, cM0, G3, ER+, PR-, HER2+) - Signed by [name], MD on 07/17/2019   08/10/2019 -  Chemotherapy   The patient had palonosetron (ALOXI) injection 0.25 mg, 0.25 mg, Intravenous,  Once, 0 of 6 cycles pegfilgrastim-jmdb (FULPHILA) injection 6 mg, 6 mg, Subcutaneous,  Once, 0 of 6 cycles CARBOplatin (PARAPLATIN) 600 mg in sodium chloride 0.9 % 250 mL chemo infusion, 600 mg (99.3 % of original dose 604.2 mg), Intravenous,  Once, 0 of 6 cycles Dose modification:   (original dose 604.2 mg, Cycle 1), 600 mg (original dose 604.2 mg, Cycle 1) DOCEtaxel (TAXOTERE) 140 mg in sodium chloride 0.9 % 250 mL chemo infusion, 65 mg/m2 = 140 mg (86.7 % of original dose 75 mg/m2), Intravenous,  Once, 0 of 6 cycles Dose modification: 65 mg/m2 (original dose 75 mg/m2, Cycle 1, Reason: Provider Judgment) pertuzumab (PERJETA) 420 mg in sodium chloride 0.9 % 250 mL chemo infusion, 420 mg (50 % of original dose 840 mg), Intravenous, Once, 0 of 6 cycles Dose modification: 420 mg (original dose 840 mg, Cycle 1, Reason: Provider Judgment) fosaprepitant (EMEND) 150 mg, dexamethasone (DECADRON) 12 mg in sodium chloride 0.9 % 145 mL IVPB, , Intravenous,  Once, 0 of 6 cycles trastuzumab-dkst (OGIVRI) 714 mg in sodium chloride 0.9 % 250 mL chemo infusion, 8 mg/kg = 714 mg, Intravenous,  Once, 0 of  6 cycles  for chemotherapy treatment.      CHIEF COMPLIANT: Cycle 1 TCH Perjeta  INTERVAL HISTORY: Marisa Spears is a 68 y.o. 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 She is  currently on adjuvant chemotherapy with [place]. Echo on 07/28/19 showed an ejection fraction of 55-60%. She presents to the clinic today for cycle 1.  She has had trouble recently with anxiety and depression.  Her grandson who has psychiatric issues is having a hard time dealing with her cancer diagnosis.  She has been crying several days because of that.  She has taken lorazepam which has helped her.  REVIEW OF SYSTEMS:   Constitutional: Denies fevers, chills or abnormal weight loss Eyes: Denies blurriness of vision Ears, nose, mouth, throat, and face: Denies mucositis or sore throat Respiratory: Denies cough, dyspnea or wheezes Cardiovascular: Denies palpitation, chest discomfort Gastrointestinal: Denies nausea, heartburn or change in bowel habits Skin: Denies abnormal skin rashes Lymphatics: Denies new lymphadenopathy or easy bruising Neurological: Denies numbness, tingling or new weaknesses Behavioral/Psych: Anxiety and depression Extremities: No lower extremity edema Breast: s/p right mastectomy All other systems were reviewed with the patient and are negative.  I have reviewed the past medical history, past surgical history, social history and family history with the patient and they are unchanged from previous note.  ALLERGIES:  is allergic to codeine.  MEDICATIONS:  Current Outpatient Medications  Medication Sig Dispense Refill  . aspirin EC 81 MG tablet Take 81 mg by mouth at bedtime.     . Biotin w/ Vitamins C & E (HAIR/SKIN/NAILS PO) Take 2 tablets by mouth daily.    . Calcium Carb-Cholecalciferol (CALCIUM 600+D3 PO) Take 1 tablet by mouth daily.    . Cholecalciferol (EQL VITAMIN D3) 50 MCG (2000 UT) CAPS Take 4,000 Units by mouth daily.    Marland Kitchen lidocaine-prilocaine (EMLA) cream Apply to affected area once 30 g 3  . LORazepam (ATIVAN) 0.5 MG tablet Take 1 tablet (0.5 mg total) by mouth at bedtime as needed for sleep. 30 tablet 0  . methocarbamol (ROBAXIN) 500 MG tablet Take  1 tablet (500 mg total) by mouth every 6 (six) hours as needed for muscle spasms. 30 tablet 1  . Omega-3 Fatty Acids (FISH OIL ULTRA) 1400 MG CAPS Take 1,400 mg by mouth 2 (two) times daily.    . ondansetron (ZOFRAN) 8 MG tablet Take 1 tablet (8 mg total) by mouth 2 (two) times daily as needed (Nausea or vomiting). Begin 4 days after chemotherapy. 30 tablet 1  . pravastatin (PRAVACHOL) 40 MG tablet Take 40 mg by mouth at bedtime.     . prochlorperazine (COMPAZINE) 10 MG tablet Take 1 tablet (10 mg total) by mouth every 6 (six) hours as needed (Nausea or vomiting). 30 tablet 1  . traMADol (ULTRAM) 50 MG tablet Take 1 tablet (50 mg total) by mouth every 6 (six) hours as needed (mild pain). 30 tablet 0  . triamterene-hydrochlorothiazide (MAXZIDE-25) 37.5-25 MG tablet Take 1 tablet by mouth daily.     Marland Kitchen venlafaxine XR (EFFEXOR XR) 37.5 MG 24 hr capsule Take 1 capsule (37.5 mg total) by mouth daily with breakfast. 30 capsule 6   No current facility-administered medications for this visit.     PHYSICAL EXAMINATION: ECOG PERFORMANCE STATUS: 1 - Symptomatic but completely ambulatory  Vitals:   08/10/19 0848  BP: (!) 146/92  Pulse: 65  Resp: 18  Temp: (!) 97.5 F (36.4 C)  SpO2: 98%  Filed Weights   08/10/19 0848  Weight: 210 lb 3.2 oz (95.3 kg)    GENERAL: alert, no distress and comfortable SKIN: skin color, texture, turgor are normal, no rashes or significant lesions EYES: normal, Conjunctiva are pink and non-injected, sclera clear OROPHARYNX: no exudate, no erythema and lips, buccal mucosa, and tongue normal  NECK: supple, thyroid normal size, non-tender, without nodularity LYMPH: no palpable lymphadenopathy in the cervical, axillary or inguinal LUNGS: clear to auscultation and percussion with normal breathing effort HEART: regular rate & rhythm and no murmurs and no lower extremity edema ABDOMEN: abdomen soft, non-tender and normal bowel sounds MUSCULOSKELETAL: no cyanosis of digits  and no clubbing  NEURO: alert & oriented x 3 with fluent speech, no focal motor/sensory deficits EXTREMITIES: No lower extremity edema  LABORATORY DATA:  I have reviewed the data as listed CMP Latest Ref Rng & Units 07/14/2019 07/03/2019 05/27/2019  Glucose 70 - 99 mg/dL 122(H) 109(H) 123(H)  BUN 8 - 23 mg/dL _0 Creatinine 0.44 - 1.00 mg/dL 0.75 0.74 0.89  Sodium 135 - 145 mmol/L 137 139 138  Potassium 3.5 - 5.1 mmol/L 3.7 4.1 3.8  Chloride 98 - 111 mmol/L 100 102 105  CO2 22 - 32 mmol/L _1 Calcium 8.9 - 10.3 mg/dL 9.7 9.8 9.6  Total Protein 6.5 - 8.1 g/dL - - 7.3  Total Bilirubin 0.3 - 1.2 mg/dL - - 1.0  Alkaline Phos 38 - 126 U/L - - 64  AST 15 - 41 U/L - - 15  ALT 0 - 44 U/L - - 11    Lab Results  Component Value Date   WBC 9.4 08/10/2019   HGB 13.1 08/10/2019   HCT 37.8 08/10/2019   MCV 88.3 08/10/2019   PLT 248 08/10/2019   NEUTROABS 6.7 08/10/2019    ASSESSMENT & PLAN:  Malignant neoplasm of upper-outer quadrant of right breast in female, estrogen receptor positive ([place]) 05/22/2019:Patient palpated a lump in the right breast. Mammogram showed 2.2cm mass in the UOQ, with no right axillary adenopathy. 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 Stage 2A 2.Adjuvant chemotherapy with [place] followed by Herceptin Perjeta maintenance (we also discussed neoadjuvant chemotherapy risks and benefits) started 08/10/2019 3.Adjuvant antiestrogen therapy with anastrozole 1 mg daily x7 years S1714: Taxane neuropathy study: No adverse effects due to participation in the study  -------------------------------------------------------------------------------------------------------------------------- Current treatment: Cycle 1 day 1 TCH Perjeta Echocardiogram: EF 55 to 60% Chemo education completed Chemo consent obtained Antiemetics were reviewed Labs were reviewed  Anxiety and depression: I sent a prescription for Effexor today.  She might need lorazepam intermittently. Return to clinic in 1 week for toxicity check   No orders of the defined types were placed in this encounter.  The patient has a good understanding of the overall plan. she agrees with it. she will call with any problems that may develop before the next visit here.  [name], MD 08/10/2019  Julious Oka Dorshimer am acting as scribe for Dr. [name].  I have reviewed the above documentation for accuracy and completeness, and I agree with the above.

## 2019-08-10 ENCOUNTER — Inpatient Hospital Stay (HOSPITAL_BASED_OUTPATIENT_CLINIC_OR_DEPARTMENT_OTHER): Payer: Medicare Other | Admitting: Hematology and Oncology

## 2019-08-10 ENCOUNTER — Telehealth: Payer: Self-pay | Admitting: *Deleted

## 2019-08-10 ENCOUNTER — Other Ambulatory Visit: Payer: Self-pay

## 2019-08-10 ENCOUNTER — Inpatient Hospital Stay: Payer: Medicare Other

## 2019-08-10 ENCOUNTER — Encounter: Payer: Self-pay | Admitting: *Deleted

## 2019-08-10 VITALS — BP 124/72 | HR 55 | Temp 98.4°F | Resp 16

## 2019-08-10 DIAGNOSIS — Z170 Estrogen receptor positive status [ER+]: Secondary | ICD-10-CM | POA: Diagnosis not present

## 2019-08-10 DIAGNOSIS — Z5111 Encounter for antineoplastic chemotherapy: Secondary | ICD-10-CM | POA: Diagnosis present

## 2019-08-10 DIAGNOSIS — C50411 Malignant neoplasm of upper-outer quadrant of right female breast: Secondary | ICD-10-CM

## 2019-08-10 DIAGNOSIS — Z5112 Encounter for antineoplastic immunotherapy: Secondary | ICD-10-CM | POA: Diagnosis present

## 2019-08-10 DIAGNOSIS — Z5189 Encounter for other specified aftercare: Secondary | ICD-10-CM | POA: Diagnosis not present

## 2019-08-10 DIAGNOSIS — Z79899 Other long term (current) drug therapy: Secondary | ICD-10-CM | POA: Diagnosis not present

## 2019-08-10 DIAGNOSIS — Z9013 Acquired absence of bilateral breasts and nipples: Secondary | ICD-10-CM | POA: Diagnosis not present

## 2019-08-10 DIAGNOSIS — Z95828 Presence of other vascular implants and grafts: Secondary | ICD-10-CM

## 2019-08-10 LAB — CMP (CANCER CENTER ONLY)
ALT: 9 U/L (ref 0–44)
AST: 12 U/L — ABNORMAL LOW (ref 15–41)
Albumin: 4.2 g/dL (ref 3.5–5.0)
Alkaline Phosphatase: 64 U/L (ref 38–126)
Anion gap: 10 (ref 5–15)
BUN: 18 mg/dL (ref 8–23)
CO2: 28 mmol/L (ref 22–32)
Calcium: 10.7 mg/dL — ABNORMAL HIGH (ref 8.9–10.3)
Chloride: 101 mmol/L (ref 98–111)
Creatinine: 0.76 mg/dL (ref 0.44–1.00)
GFR, Est AFR Am: >60 mL/min (ref >60)
GFR, Estimated: >60 mL/min (ref >60)
Glucose, Bld: 130 mg/dL — ABNORMAL HIGH (ref 70–99)
Potassium: 3.7 mmol/L (ref 3.5–5.1)
Sodium: 139 mmol/L (ref 135–145)
Total Bilirubin: 0.8 mg/dL (ref 0.3–1.2)
Total Protein: 7.4 g/dL (ref 6.5–8.1)

## 2019-08-10 LAB — CBC WITH DIFFERENTIAL (CANCER CENTER ONLY)
Abs Immature Granulocytes: 0.05 10*3/uL (ref 0.00–0.07)
Basophils Absolute: 0 10*3/uL (ref 0.0–0.1)
Basophils Relative: 0 %
Eosinophils Absolute: 0 10*3/uL (ref 0.0–0.5)
Eosinophils Relative: 0 %
HCT: 37.8 % (ref 36.0–46.0)
Hemoglobin: 13.1 g/dL (ref 12.0–15.0)
Immature Granulocytes: 1 %
Lymphocytes Relative: 21 %
Lymphs Abs: 1.9 10*3/uL (ref 0.7–4.0)
MCH: 30.6 pg (ref 26.0–34.0)
MCHC: 34.7 g/dL (ref 30.0–36.0)
MCV: 88.3 fL (ref 80.0–100.0)
Monocytes Absolute: 0.6 10*3/uL (ref 0.1–1.0)
Monocytes Relative: 7 %
Neutro Abs: 6.7 10*3/uL (ref 1.7–7.7)
Neutrophils Relative %: 71 %
Platelet Count: 248 10*3/uL (ref 150–400)
RBC: 4.28 MIL/uL (ref 3.87–5.11)
RDW: 12.4 % (ref 11.5–15.5)
WBC Count: 9.4 10*3/uL (ref 4.0–10.5)
nRBC: 0 % (ref 0.0–0.2)

## 2019-08-10 MED ORDER — PALONOSETRON HCL INJECTION 0.25 MG/5ML
INTRAVENOUS | Status: AC
  Filled 2019-08-10: qty 5

## 2019-08-10 MED ORDER — PALONOSETRON HCL INJECTION 0.25 MG/5ML
0.2500 mg | Freq: Once | INTRAVENOUS | Status: AC
  Administered 2019-08-10: 15:00:00 0.25 mg via INTRAVENOUS

## 2019-08-10 MED ORDER — SODIUM CHLORIDE 0.9 % IV SOLN
420.0000 mg | Freq: Once | INTRAVENOUS | Status: AC
  Administered 2019-08-10: 420 mg via INTRAVENOUS
  Filled 2019-08-10: qty 14

## 2019-08-10 MED ORDER — SODIUM CHLORIDE 0.9 % IV SOLN
65.0000 mg/m2 | Freq: Once | INTRAVENOUS | Status: AC
  Administered 2019-08-10: 140 mg via INTRAVENOUS
  Filled 2019-08-10: qty 14

## 2019-08-10 MED ORDER — TRASTUZUMAB-DKST CHEMO 150 MG IV SOLR
750.0000 mg | Freq: Once | INTRAVENOUS | Status: AC
  Administered 2019-08-10: 750 mg via INTRAVENOUS
  Filled 2019-08-10: qty 35.72

## 2019-08-10 MED ORDER — SODIUM CHLORIDE 0.9 % IV SOLN
Freq: Once | INTRAVENOUS | Status: AC
  Administered 2019-08-10: 10:00:00 via INTRAVENOUS
  Filled 2019-08-10: qty 250

## 2019-08-10 MED ORDER — ACETAMINOPHEN 325 MG PO TABS
ORAL_TABLET | ORAL | Status: AC
  Filled 2019-08-10: qty 2

## 2019-08-10 MED ORDER — ACETAMINOPHEN 325 MG PO TABS
650.0000 mg | ORAL_TABLET | Freq: Once | ORAL | Status: AC
  Administered 2019-08-10: 650 mg via ORAL

## 2019-08-10 MED ORDER — HEPARIN SOD (PORK) LOCK FLUSH 100 UNIT/ML IV SOLN
500.0000 [IU] | Freq: Once | INTRAVENOUS | Status: AC | PRN
  Administered 2019-08-10: 500 [IU]
  Filled 2019-08-10: qty 5

## 2019-08-10 MED ORDER — SODIUM CHLORIDE 0.9 % IV SOLN
Freq: Once | INTRAVENOUS | Status: AC
  Administered 2019-08-10: 15:00:00 via INTRAVENOUS
  Filled 2019-08-10: qty 5

## 2019-08-10 MED ORDER — SODIUM CHLORIDE 0.9 % IV SOLN
600.0000 mg | Freq: Once | INTRAVENOUS | Status: AC
  Administered 2019-08-10: 600 mg via INTRAVENOUS
  Filled 2019-08-10: qty 60

## 2019-08-10 MED ORDER — SODIUM CHLORIDE 0.9% FLUSH
10.0000 mL | INTRAVENOUS | Status: DC | PRN
  Administered 2019-08-10: 10 mL
  Filled 2019-08-10: qty 10

## 2019-08-10 MED ORDER — VENLAFAXINE HCL ER 37.5 MG PO CP24: 37.5000 mg | ORAL_CAPSULE | Freq: Every day | ORAL | 6 refills | Status: DC

## 2019-08-10 MED ORDER — SODIUM CHLORIDE 0.9% FLUSH
10.0000 mL | INTRAVENOUS | Status: DC | PRN
  Administered 2019-08-10: 10 mL via INTRAVENOUS
  Filled 2019-08-10: qty 10

## 2019-08-10 MED ORDER — DIPHENHYDRAMINE HCL 25 MG PO CAPS
ORAL_CAPSULE | ORAL | Status: AC
  Filled 2019-08-10: qty 2

## 2019-08-10 MED ORDER — DIPHENHYDRAMINE HCL 25 MG PO CAPS
50.0000 mg | ORAL_CAPSULE | Freq: Once | ORAL | Status: AC
  Administered 2019-08-10: 50 mg via ORAL

## 2019-08-10 NOTE — Progress Notes (Signed)
PT had drain tube released and the site was leaking fluid. I cleaned the PT around the site and placed 2 4x4 gauze with tape on it. Made Dr Lindi Adie nurse aware, PT had appt after flush as well.

## 2019-08-10 NOTE — Assessment & Plan Note (Signed)
05/22/2019:Patient palpated a lump in the right breast. Mammogram showed 2.2cm mass in the UOQ, with no right axillary adenopathy. 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

## 2019-08-10 NOTE — Patient Instructions (Signed)

## 2019-08-10 NOTE — Research (Signed)
DCP-001 Use of a Clinical Trial Screening Tool to Address Cancer Health Disparities in the [place] Program: Met with patient in infusion room to review the DCP-001 study and offer her the opportunity to participate. Informed patient that participation is voluntary and involves a one time consent and collection of demographic variables, with the majority of data collected from her medical record. Noted that no patient identifiers are being reported to the study.  Spent 15 minutes reviewing the consent and authorization forms with the patient in their entirety. Explained the potential benefits and risks of participation in this study. All of her questions were answered and she agreed to participate. The consent form for [place] 01/16/18, [place] Active 05/08/18 and authorization form dated 01/17/15 were signed and dated by the patient. Copies of the documents were given to the patient for her records.  Patient was then interviewed by this research nurse and answered the study questions, which took about 5 minutes.  Thanked patient for her time and participation on this study.  Patient meets eligibility and will be enrolled in the DCP-001 study.  Worksheet and original consent placed in Engineer, maintenance (IT) to enroll patient on study. Foye Spurling, BSN, RN Clinical Research Nurse 08/10/2019 4:05 PM

## 2019-08-10 NOTE — Patient Instructions (Signed)
[facility] [facility] Discharge Instructions for Patients Receiving Chemotherapy  Today you received the following chemotherapy agents:  Trastuzumab, pertuzumab, docetaxel, carboplatin  To help prevent nausea and vomiting after your treatment, we encourage you to take your nausea medication as prescribed.   If you develop nausea and vomiting that is not controlled by your nausea medication, call the clinic.   BELOW ARE SYMPTOMS THAT SHOULD BE REPORTED IMMEDIATELY:  *FEVER GREATER THAN 100.5 F  *CHILLS WITH OR WITHOUT FEVER  NAUSEA AND VOMITING THAT IS NOT CONTROLLED WITH YOUR NAUSEA MEDICATION  *UNUSUAL SHORTNESS OF BREATH  *UNUSUAL BRUISING OR BLEEDING  TENDERNESS IN MOUTH AND THROAT WITH OR WITHOUT PRESENCE OF ULCERS  *URINARY PROBLEMS  *BOWEL PROBLEMS  UNUSUAL RASH Items with * indicate a potential emergency and should be followed up as soon as possible.  Feel free to call the clinic should you have any questions or concerns. The clinic phone number is [phone].  Please show the [place] at check-in to the Emergency Department and triage nurse.

## 2019-08-10 NOTE — Telephone Encounter (Signed)
Left vm regarding questions or needs with 1st chemo. Contact information provided for questions or concerns.

## 2019-08-11 ENCOUNTER — Telehealth: Payer: Self-pay | Admitting: *Deleted

## 2019-08-12 ENCOUNTER — Other Ambulatory Visit: Payer: Self-pay

## 2019-08-12 ENCOUNTER — Inpatient Hospital Stay: Payer: Medicare Other

## 2019-08-12 ENCOUNTER — Encounter: Payer: Self-pay | Admitting: Hematology and Oncology

## 2019-08-12 VITALS — BP 124/85 | HR 64 | Temp 98.0°F | Resp 18

## 2019-08-12 DIAGNOSIS — C50411 Malignant neoplasm of upper-outer quadrant of right female breast: Secondary | ICD-10-CM

## 2019-08-12 DIAGNOSIS — Z5112 Encounter for antineoplastic immunotherapy: Secondary | ICD-10-CM | POA: Diagnosis not present

## 2019-08-12 DIAGNOSIS — Z170 Estrogen receptor positive status [ER+]: Secondary | ICD-10-CM

## 2019-08-12 MED ORDER — PEGFILGRASTIM-JMDB 6 MG/0.6ML ~~LOC~~ SOSY
6.0000 mg | PREFILLED_SYRINGE | Freq: Once | SUBCUTANEOUS | Status: AC
  Administered 2019-08-12: 15:00:00 6 mg via SUBCUTANEOUS

## 2019-08-12 MED ORDER — PEGFILGRASTIM-JMDB 6 MG/0.6ML ~~LOC~~ SOSY
PREFILLED_SYRINGE | SUBCUTANEOUS | Status: AC
  Filled 2019-08-12: qty 0.6

## 2019-08-12 NOTE — Patient Instructions (Signed)

## 2019-08-12 NOTE — Progress Notes (Signed)
Met with patient at registration to introduce myself as Arboriculturist and to offer available resources.  Patient has 2 insurances therefore copay assistance should not be needed.  Discussed one-time $1000 Radio broadcast assistant to assist with personal household expenses while going through treatment.  Gave her my card if interested in applying and for any additional financial questions or concerns. She verbalized understanding.

## 2019-08-14 ENCOUNTER — Telehealth: Payer: Self-pay

## 2019-08-14 NOTE — Telephone Encounter (Signed)
Pt reports nausea and vomiting X 1 this AM.  Pt tolerating fluids well.  Pt reports using antiemetics yesterday and took Zofran around 7am today.    RN educated patient to take compazine now (around 10:30am), and continue to alternate between antiemetics.  Continue to push fluids as tolerated.   RN called patient in afternoon to follow up.  Pt reports nausea has improved, some mild nausea still present but tolerable.     RN encouraged patient to continue with antiemetics, and keep follow up for Monday.

## 2019-08-16 NOTE — Progress Notes (Signed)
Patient Care Team: Earney Mallet, MD as PCP - General (Family Medicine) [name], RN as Oncology Nurse Navigator Rockwell Germany, RN as Oncology Nurse Navigator [name], MD as Consulting Physician (Hematology and Oncology) [name], MD as Consulting Physician (General Surgery) [name], MD as Attending Physician (Radiation Oncology)  DIAGNOSIS:    ICD-10-CM   1. Malignant neoplasm of upper-outer quadrant of right breast in female, estrogen receptor positive ([place])  C50.411    Z17.0     SUMMARY OF ONCOLOGIC HISTORY: Oncology History  Malignant neoplasm of upper-outer quadrant of right breast in female, estrogen receptor positive ([place])  1992 Miscellaneous   Left mastectomy followed by adjuvant chemotherapy   05/22/2019 Initial Diagnosis   Patient palpated a lump in the right breast. Mammogram showed 2.2cm mass in the UOQ, with no right axillary adenopathy. 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 The Common Hereditary Cancers Panel offered by [facility] includes sequencing and/or deletion duplication testing of the following 48 genes: APC, ATM, AXIN2, BARD1, BMPR1A, BRCA1, BRCA2, BRIP1, CDH1, CDKN2A (p14ARF), CDKN2A (p16INK4a), CKD4, CHEK2, CTNNA1, DICER1, EPCAM (Deletion/duplication testing only), GREM1 (promoter region deletion/duplication testing only), KIT, MEN1, MLH1, MSH2, MSH3, MSH6, MUTYH, NBN, NF1, NHTL1, PALB2, PDGFRA, PMS2, POLD1, POLE, PTEN, RAD50, RAD51C, RAD51D, RNF43, SDHB, SDHC, SDHD, SMAD4, SMARCA4. STK11, TP53, TSC1,  TSC2, and VHL.  The following genes were evaluated for sequence changes only: SDHA and HOXB13 c.251G>A variant only. The report date is 06/18/2019.   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   07/17/2019 Cancer Staging   Staging form: Breast, AJCC 8th Edition - Pathologic: Stage IIA (pT2, pN0, cM0, G3, ER+, PR-, HER2+) - Signed by [name], MD on 07/17/2019   08/10/2019 -  Chemotherapy   The patient had palonosetron (ALOXI) injection 0.25 mg, 0.25 mg, Intravenous,  Once, 1 of 6 cycles Administration: 0.25 mg (08/10/2019) pegfilgrastim-jmdb (FULPHILA) injection 6 mg, 6 mg, Subcutaneous,  Once, 1 of 6 cycles Administration: 6 mg (08/12/2019) CARBOplatin (PARAPLATIN) 600 mg in sodium chloride 0.9 % 250 mL chemo infusion, 600 mg (99.3 % of original dose 604.2 mg), Intravenous,  Once, 1 of 6 cycles Dose modification:   (original dose 604.2 mg, Cycle 1), 600 mg (original dose 604.2 mg, Cycle 1) Administration: 600 mg (08/10/2019) DOCEtaxel (TAXOTERE) 140 mg in sodium chloride 0.9 % 250 mL chemo infusion, 65 mg/m2 = 140 mg (86.7 % of original dose 75 mg/m2), Intravenous,  Once, 1 of 6 cycles Dose modification: 65 mg/m2 (original dose 75 mg/m2, Cycle 1, Reason: Provider Judgment) Administration: 140 mg (08/10/2019) pertuzumab (PERJETA) 420 mg in sodium chloride 0.9 % 250 mL chemo infusion, 420 mg (50 % of original dose 840 mg), Intravenous, Once, 1 of 6 cycles Dose modification: 420 mg (original dose 840 mg, Cycle 1, Reason: Provider Judgment) Administration: 420 mg (08/10/2019) fosaprepitant (EMEND) 150 mg, dexamethasone (DECADRON) 12 mg in sodium chloride 0.9 % 145 mL IVPB, , Intravenous,  Once, 1 of 6 cycles Administration:  (08/10/2019)  trastuzumab-dkst (OGIVRI) 750 mg in sodium chloride 0.9 % 250 mL chemo infusion, 714 mg, Intravenous,  Once, 1 of 6 cycles Administration: 750 mg  (08/10/2019)  for chemotherapy treatment.      CHIEF COMPLIANT: Cycle 1 Day 8 [facility] Perjeta  INTERVAL HISTORY: Marisa Spears is a 68 y.o. 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 She is currently on adjuvant chemotherapy with [place]. She presents to the clinic today for a toxicity check following cycle 1. For the first 2 days after chemo she did very well.  After that she developed nausea and vomiting.  Her nurses have taught her to take Compazine in between.  She did much better for a couple of days and then again yesterday and today she is feeling very nauseated.  She also had diarrhea 1-2 times per day.  She has taken Imodium today and the symptoms seem to have helped.   REVIEW OF SYSTEMS:   Constitutional: Denies fevers, chills or abnormal weight loss Eyes: Denies blurriness of vision Ears, nose, mouth, throat, and face: Denies mucositis or sore throat Respiratory: Denies cough, dyspnea or wheezes Cardiovascular: Denies palpitation, chest discomfort Gastrointestinal: Nausea vomiting and diarrhea Skin: Denies abnormal skin rashes Lymphatics: Denies new lymphadenopathy or easy bruising Neurological: Denies numbness, tingling or new weaknesses Behavioral/Psych: Mood is stable, no new changes  Extremities: No lower extremity edema Breast: denies any pain or lumps or nodules in either breasts All other systems were reviewed with the patient and are negative.  I have reviewed the past medical history, past surgical history, social history and family history with the patient and they are unchanged from previous note.  ALLERGIES:  is allergic to codeine.  MEDICATIONS:  Current Outpatient Medications  Medication Sig Dispense Refill   aspirin EC 81 MG tablet Take 81 mg by mouth at bedtime.      Biotin w/ Vitamins C & E (HAIR/SKIN/NAILS PO) Take 2 tablets by mouth daily.     Calcium Carb-Cholecalciferol (CALCIUM 600+D3 PO) Take 1  tablet by mouth daily.     Cholecalciferol (EQL VITAMIN D3) 50 MCG (2000 UT) CAPS Take 4,000 Units by mouth daily.     lidocaine-prilocaine (EMLA) cream Apply to affected area once 30 g 3   LORazepam (ATIVAN) 0.5 MG tablet Take 1 tablet (0.5 mg total) by mouth at bedtime as needed for sleep. 30 tablet 0   methocarbamol (ROBAXIN) 500 MG tablet Take 1 tablet (500 mg total) by mouth every 6 (six) hours as needed for muscle spasms. 30 tablet 1   Omega-3 Fatty Acids (FISH OIL ULTRA) 1400 MG CAPS Take 1,400 mg by mouth 2 (two) times daily.     ondansetron (ZOFRAN) 8 MG tablet Take 1 tablet (8 mg total) by mouth 2 (two) times daily as needed (Nausea or vomiting). Begin 4 days after chemotherapy. 30 tablet 1   pravastatin (PRAVACHOL) 40 MG tablet Take 40 mg by mouth at bedtime.      prochlorperazine (COMPAZINE) 10 MG tablet Take 1 tablet (10 mg total) by mouth every 6 (six) hours as needed (Nausea or vomiting). 30 tablet 1   traMADol (ULTRAM) 50 MG tablet Take 1 tablet (50 mg total) by mouth every 6 (six) hours as needed (mild pain). 30 tablet 0   triamterene-hydrochlorothiazide (MAXZIDE-25) 37.5-25 MG tablet Take 1 tablet by mouth daily.      venlafaxine XR (EFFEXOR XR) 37.5 MG 24 hr capsule Take 1 capsule (37.5 mg total) by mouth daily  with breakfast. 30 capsule 6   No current facility-administered medications for this visit.     PHYSICAL EXAMINATION: ECOG PERFORMANCE STATUS: 1 - Symptomatic but completely ambulatory  Vitals:   08/17/19 0930  BP: (!) 132/95  Pulse: 93  Resp: 16  Temp: 98.5 F (36.9 C)  SpO2: 98%   Filed Weights   08/17/19 0930  Weight: 198 lb 1.6 oz (89.9 kg)    GENERAL: alert, no distress and comfortable SKIN: skin color, texture, turgor are normal, no rashes or significant lesions EYES: normal, Conjunctiva are pink and non-injected, sclera clear OROPHARYNX: no exudate, no erythema and lips, buccal mucosa, and tongue normal  NECK: supple, thyroid normal  size, non-tender, without nodularity LYMPH: no palpable lymphadenopathy in the cervical, axillary or inguinal LUNGS: clear to auscultation and percussion with normal breathing effort HEART: regular rate & rhythm and no murmurs and no lower extremity edema ABDOMEN: abdomen soft, non-tender and normal bowel sounds MUSCULOSKELETAL: no cyanosis of digits and no clubbing  NEURO: alert & oriented x 3 with fluent speech, no focal motor/sensory deficits EXTREMITIES: No lower extremity edema  LABORATORY DATA:  I have reviewed the data as listed CMP Latest Ref Rng & Units 08/10/2019 07/14/2019 07/03/2019  Glucose 70 - 99 mg/dL 130(H) 122(H) 109(H)  BUN 8 - 23 mg/dL '18 13 9  ' Creatinine 0.44 - 1.00 mg/dL 0.76 0.75 0.74  Sodium 135 - 145 mmol/L 139 137 139  Potassium 3.5 - 5.1 mmol/L 3.7 3.7 4.1  Chloride 98 - 111 mmol/L 101 100 102  CO2 22 - 32 mmol/L '28 24 27  ' Calcium 8.9 - 10.3 mg/dL 10.7(H) 9.7 9.8  Total Protein 6.5 - 8.1 g/dL 7.4 - -  Total Bilirubin 0.3 - 1.2 mg/dL 0.8 - -  Alkaline Phos 38 - 126 U/L 64 - -  AST 15 - 41 U/L 12(L) - -  ALT 0 - 44 U/L 9 - -    Lab Results  Component Value Date   WBC 4.2 08/17/2019   HGB 13.7 08/17/2019   HCT 38.5 08/17/2019   MCV 87.3 08/17/2019   PLT 235 08/17/2019   NEUTROABS PENDING 08/17/2019    ASSESSMENT & PLAN:  Malignant neoplasm of upper-outer quadrant of right breast in female, estrogen receptor positive ([place]) 05/22/2019:Patient palpated a lump in the right breast. Mammogram showed 2.2cm mass in the UOQ, with no right axillary adenopathy. 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 Stage  2A 2.Adjuvant chemotherapy with [place] followed by Herceptin Perjeta maintenance (we also discussed neoadjuvant chemotherapy risks and benefits) started 08/10/2019 3.Adjuvant antiestrogen therapy with anastrozole 1 mg daily x7 years [id number]: Taxane neuropathy study: No adverse effects due to participation in the study -------------------------------------------------------------------------------------------------------------------------- Current treatment: Cycle 1 day 8 [place] Per[place] Echocardiogram: EF 55 to 60% Chemo toxicities: 1.  Nausea and vomiting: Instructed her to take Zofran 3 times a day scheduled and Compazine as needed.  We will plan to give her some IV fluids with [place] today. 2.  Diarrhea: 1-2 times per day but today she has had a couple of episodes already.  She took Imodium and that seemed to have stopped it. 3.  Fatigue due to chemotherapy Monitoring closely for toxicities. Anxiety and depression: On  Effexor Return to clinic in 2 weeks for cycle 2  No orders of the defined types were placed in this encounter.  The patient has a good understanding of the overall plan. she agrees with it. she will call with any problems that may develop before the next visit here.  [name], MD 08/17/2019  Julious Oka Dorshimer am acting as scribe for Dr. [name].  I have reviewed the above documentation for accuracy and completeness, and I agree with the above.

## 2019-08-17 ENCOUNTER — Inpatient Hospital Stay: Payer: Medicare Other

## 2019-08-17 ENCOUNTER — Ambulatory Visit: Payer: Medicare Other | Admitting: Physical Therapy

## 2019-08-17 ENCOUNTER — Other Ambulatory Visit: Payer: Self-pay

## 2019-08-17 ENCOUNTER — Inpatient Hospital Stay (HOSPITAL_BASED_OUTPATIENT_CLINIC_OR_DEPARTMENT_OTHER): Payer: Medicare Other | Admitting: Hematology and Oncology

## 2019-08-17 DIAGNOSIS — Z95828 Presence of other vascular implants and grafts: Secondary | ICD-10-CM

## 2019-08-17 DIAGNOSIS — Z170 Estrogen receptor positive status [ER+]: Secondary | ICD-10-CM | POA: Diagnosis not present

## 2019-08-17 DIAGNOSIS — R112 Nausea with vomiting, unspecified: Secondary | ICD-10-CM

## 2019-08-17 DIAGNOSIS — C50411 Malignant neoplasm of upper-outer quadrant of right female breast: Secondary | ICD-10-CM | POA: Diagnosis not present

## 2019-08-17 DIAGNOSIS — Z5112 Encounter for antineoplastic immunotherapy: Secondary | ICD-10-CM | POA: Diagnosis not present

## 2019-08-17 LAB — CBC WITH DIFFERENTIAL (CANCER CENTER ONLY)
Abs Immature Granulocytes: 0.06 10*3/uL (ref 0.00–0.07)
Basophils Absolute: 0.1 10*3/uL (ref 0.0–0.1)
Basophils Relative: 1 %
Eosinophils Absolute: 0 10*3/uL (ref 0.0–0.5)
Eosinophils Relative: 1 %
HCT: 38.5 % (ref 36.0–46.0)
Hemoglobin: 13.7 g/dL (ref 12.0–15.0)
Immature Granulocytes: 1 %
Lymphocytes Relative: 24 %
Lymphs Abs: 1 10*3/uL (ref 0.7–4.0)
MCH: 31.1 pg (ref 26.0–34.0)
MCHC: 35.6 g/dL (ref 30.0–36.0)
MCV: 87.3 fL (ref 80.0–100.0)
Monocytes Absolute: 0.7 10*3/uL (ref 0.1–1.0)
Monocytes Relative: 16 %
Neutro Abs: 2.4 10*3/uL (ref 1.7–7.7)
Neutrophils Relative %: 57 %
Platelet Count: 235 10*3/uL (ref 150–400)
RBC: 4.41 MIL/uL (ref 3.87–5.11)
RDW: 12 % (ref 11.5–15.5)
WBC Count: 4.2 10*3/uL (ref 4.0–10.5)
nRBC: 0 % (ref 0.0–0.2)

## 2019-08-17 LAB — CMP (CANCER CENTER ONLY)
ALT: 10 U/L (ref 0–44)
AST: 12 U/L — ABNORMAL LOW (ref 15–41)
Albumin: 4 g/dL (ref 3.5–5.0)
Alkaline Phosphatase: 88 U/L (ref 38–126)
Anion gap: 12 (ref 5–15)
BUN: 14 mg/dL (ref 8–23)
CO2: 26 mmol/L (ref 22–32)
Calcium: 9.8 mg/dL (ref 8.9–10.3)
Chloride: 95 mmol/L — ABNORMAL LOW (ref 98–111)
Creatinine: 0.97 mg/dL (ref 0.44–1.00)
GFR, Est AFR Am: >60 mL/min (ref >60)
GFR, Estimated: 59 mL/min — ABNORMAL LOW (ref >60)
Glucose, Bld: 145 mg/dL — ABNORMAL HIGH (ref 70–99)
Potassium: 3.4 mmol/L — ABNORMAL LOW (ref 3.5–5.1)
Sodium: 133 mmol/L — ABNORMAL LOW (ref 135–145)
Total Bilirubin: 0.8 mg/dL (ref 0.3–1.2)
Total Protein: 7.4 g/dL (ref 6.5–8.1)

## 2019-08-17 MED ORDER — SODIUM CHLORIDE 0.9% FLUSH
10.0000 mL | INTRAVENOUS | Status: DC | PRN
  Administered 2019-08-17: 10 mL via INTRAVENOUS
  Filled 2019-08-17: qty 10

## 2019-08-17 MED ORDER — PALONOSETRON HCL INJECTION 0.25 MG/5ML
INTRAVENOUS | Status: AC
  Filled 2019-08-17: qty 5

## 2019-08-17 MED ORDER — PALONOSETRON HCL INJECTION 0.25 MG/5ML
0.2500 mg | Freq: Once | INTRAVENOUS | Status: AC
  Administered 2019-08-17: 0.25 mg via INTRAVENOUS

## 2019-08-17 MED ORDER — HEPARIN SOD (PORK) LOCK FLUSH 100 UNIT/ML IV SOLN
500.0000 [IU] | Freq: Once | INTRAVENOUS | Status: DC
  Filled 2019-08-17: qty 5

## 2019-08-17 MED ORDER — SODIUM CHLORIDE 0.9 % IV SOLN
INTRAVENOUS | Status: AC
  Administered 2019-08-17: 10:00:00 via INTRAVENOUS
  Filled 2019-08-17 (×2): qty 250

## 2019-08-17 MED ORDER — HEPARIN SOD (PORK) LOCK FLUSH 100 UNIT/ML IV SOLN
500.0000 [IU] | Freq: Once | INTRAVENOUS | Status: AC
  Administered 2019-08-17: 500 [IU] via INTRAVENOUS
  Filled 2019-08-17: qty 5

## 2019-08-17 NOTE — Patient Instructions (Signed)

## 2019-08-17 NOTE — Patient Instructions (Signed)
Dehydration, Adult  Dehydration is when there is not enough fluid or water in your body. This happens when you lose more fluids than you take in. Dehydration can range from mild to very bad. It should be treated right away to keep it from getting very bad. Symptoms of mild dehydration may include:  Thirst.  Dry lips.  Slightly dry mouth.  Dry, warm skin.  Dizziness. Symptoms of moderate dehydration may include:  Very dry mouth.  Muscle cramps.  Dark pee (urine). Pee may be the color of tea.  Your body making less pee.  Your eyes making fewer tears.  Heartbeat that is uneven or faster than normal (palpitations).  Headache.  Light-headedness, especially when you stand up from sitting.  Fainting (syncope). Symptoms of very bad dehydration may include:  Changes in skin, such as: ? Cold and clammy skin. ? Blotchy (mottled) or pale skin. ? Skin that does not quickly return to normal after being lightly pinched and let go (poor skin turgor).  Changes in body fluids, such as: ? Feeling very thirsty. ? Your eyes making fewer tears. ? Not sweating when body temperature is high, such as in hot weather. ? Your body making very little pee.  Changes in vital signs, such as: ? Weak pulse. ? Pulse that is more than 100 beats a minute when you are sitting still. ? Fast breathing. ? Low blood pressure.  Other changes, such as: ? Sunken eyes. ? Cold hands and feet. ? Confusion. ? Lack of energy (lethargy). ? Trouble waking up from sleep. ? Short-term weight loss. ? Unconsciousness. Follow these instructions at home:   If told by your doctor, drink an ORS: ? Make an ORS by using instructions on the package. ? Start by drinking small amounts, about  cup (120 mL) every 5-10 minutes. ? Slowly drink more until you have had the amount that your doctor said to have.  Drink enough clear fluid to keep your pee clear or pale yellow. If you were told to drink an ORS, finish the  ORS first, then start slowly drinking clear fluids. Drink fluids such as: ? Water. Do not drink only water by itself. Doing that can make the salt (sodium) level in your body get too low (hyponatremia). ? Ice chips. ? Fruit juice that you have added water to (diluted). ? Low-calorie sports drinks.  Avoid: ? Alcohol. ? Drinks that have a lot of sugar. These include high-calorie sports drinks, fruit juice that does not have water added, and soda. ? Caffeine. ? Foods that are greasy or have a lot of fat or sugar.  Take over-the-counter and prescription medicines only as told by your doctor.  Do not take salt tablets. Doing that can make the salt level in your body get too high (hypernatremia).  Eat foods that have minerals (electrolytes). Examples include bananas, oranges, potatoes, tomatoes, and spinach.  Keep all follow-up visits as told by your doctor. This is important. Contact a doctor if:  You have belly (abdominal) pain that: ? Gets worse. ? Stays in one area (localizes).  You have a rash.  You have a stiff neck.  You get angry or annoyed more easily than normal (irritability).  You are more sleepy than normal.  You have a harder time waking up than normal.  You feel: ? Weak. ? Dizzy. ? Very thirsty.  You have peed (urinated) only a small amount of very dark pee during 6-8 hours. Get help right away if:  You have   symptoms of very bad dehydration.  You cannot drink fluids without throwing up (vomiting).  Your symptoms get worse with treatment.  You have a fever.  You have a very bad headache.  You are throwing up or having watery poop (diarrhea) and it: ? Gets worse. ? Does not go away.  You have blood or something green (bile) in your throw-up.  You have blood in your poop (stool). This may cause poop to look black and tarry.  You have not peed in 6-8 hours.  You pass out (faint).  Your heart rate when you are sitting still is more than 100 beats a  minute.  You have trouble breathing. This information is not intended to replace advice given to you by your health care provider. Make sure you discuss any questions you have with your health care provider. Document Released: 09/08/2009 Document Revised: 10/25/2017 Document Reviewed: 01/06/2016 Elsevier Patient Education  2020 [employer]  

## 2019-08-17 NOTE — Assessment & Plan Note (Signed)
05/22/2019:Patient palpated a lump in the right breast. Mammogram showed 2.2cm mass in the UOQ, with no right axillary adenopathy. 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 Stage 2A 2.Adjuvant chemotherapy with [place] followed by Herceptin Perjeta maintenance (we also discussed neoadjuvant chemotherapy risks and benefits) started 08/10/2019 3.Adjuvant antiestrogen therapy with anastrozole 1 mg daily x7 years [id number]: Taxane neuropathy study: No adverse effects due to participation in the study -------------------------------------------------------------------------------------------------------------------------- Current treatment: Cycle 1 day 8 [place] Perjeta Echocardiogram: EF 55 to 60% Chemo toxicities: 1.  Nausea and vomiting  Anxiety and depression: On Effexor Return to clinic in 2 weeks for cycle 2

## 2019-08-17 NOTE — Progress Notes (Signed)
Pt was scheduled for a flush today but stated she wasn't feeling well. I spoke with liz, RN and told her I would leave the PT accessed and if they decided she didn't need fluids they could de access her.

## 2019-08-19 ENCOUNTER — Ambulatory Visit: Payer: Medicare Other | Admitting: Physical Therapy

## 2019-08-21 ENCOUNTER — Encounter: Payer: Self-pay | Admitting: *Deleted

## 2019-08-24 ENCOUNTER — Telehealth: Payer: Self-pay

## 2019-08-24 NOTE — Telephone Encounter (Signed)
RN returned call, voicemail left for return call.  

## 2019-08-24 NOTE — Telephone Encounter (Signed)
Pt reports redness to face.  Pt denies any discomfort to face, or warmth.  Pt denies and changes in skin to other extremities. No fever, nausea, or vomiting.     RN educated side effects of Decadron, including redness to face.  RN encouraged use to face moisturizer over next 2 days.  Pt will report to clinic if no improvement.

## 2019-08-30 NOTE — Progress Notes (Signed)
Patient Care Team: Earney Mallet, MD as PCP - General (Family Medicine) [name], RN as Oncology Nurse Navigator Rockwell Germany, RN as Oncology Nurse Navigator [name], MD as Consulting Physician (Hematology and Oncology) [name], MD as Consulting Physician (General Surgery) [name], MD as Attending Physician (Radiation Oncology)  DIAGNOSIS:    ICD-10-CM   1. Malignant neoplasm of upper-outer quadrant of right breast in female, estrogen receptor positive ([place])  C50.411 LORazepam (ATIVAN) 0.5 MG tablet   Z17.0     SUMMARY OF ONCOLOGIC HISTORY: Oncology History  Malignant neoplasm of upper-outer quadrant of right breast in female, estrogen receptor positive ([place])  1992 Miscellaneous   Left mastectomy followed by adjuvant chemotherapy   05/22/2019 Initial Diagnosis   Patient palpated a lump in the right breast. Mammogram showed 2.2cm mass in the UOQ, with no right axillary adenopathy. 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 The Common Hereditary Cancers Panel offered by [facility] includes sequencing and/or deletion duplication testing of the following 48 genes: APC, ATM, AXIN2, BARD1, BMPR1A, BRCA1, BRCA2, BRIP1, CDH1, CDKN2A (p14ARF), CDKN2A (p16INK4a), CKD4, CHEK2, CTNNA1, DICER1, EPCAM (Deletion/duplication testing only), GREM1 (promoter region deletion/duplication testing only), KIT, MEN1, MLH1, MSH2, MSH3, MSH6, MUTYH, NBN, NF1, NHTL1, PALB2, PDGFRA, PMS2, POLD1, POLE, PTEN, RAD50, RAD51C, RAD51D, RNF43, SDHB, SDHC, SDHD, SMAD4,  SMARCA4. STK11, TP53, TSC1, TSC2, and VHL.  The following genes were evaluated for sequence changes only: SDHA and HOXB13 c.251G>A variant only. The report date is 06/18/2019.   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   07/17/2019 Cancer Staging   Staging form: Breast, AJCC 8th Edition - Pathologic: Stage IIA (pT2, pN0, cM0, G3, ER+, PR-, HER2+) - Signed by [name], MD on 07/17/2019   08/10/2019 -  Chemotherapy   The patient had palonosetron (ALOXI) injection 0.25 mg, 0.25 mg, Intravenous,  Once, 1 of 6 cycles Administration: 0.25 mg (08/10/2019) pegfilgrastim-jmdb (FULPHILA) injection 6 mg, 6 mg, Subcutaneous,  Once, 1 of 6 cycles Administration: 6 mg (08/12/2019) CARBOplatin (PARAPLATIN) 600 mg in sodium chloride 0.9 % 250 mL chemo infusion, 600 mg (99.3 % of original dose 604.2 mg), Intravenous,  Once, 1 of 6 cycles Dose modification:   (original dose 604.2 mg, Cycle 1), 600 mg (original dose 604.2 mg, Cycle 1),   (original dose 604.2 mg, Cycle 2, Reason: Provider Judgment) Administration: 600 mg (08/10/2019) DOCEtaxel (TAXOTERE) 140 mg in sodium chloride 0.9 % 250 mL chemo infusion, 65 mg/m2 = 140 mg (86.7 % of original dose 75 mg/m2), Intravenous,  Once, 1 of 6 cycles Dose modification: 65 mg/m2 (original dose 75 mg/m2, Cycle 1, Reason: Provider Judgment), 50 mg/m2 (original dose 75 mg/m2, Cycle 2, Reason: Provider Judgment) Administration: 140 mg (08/10/2019) pertuzumab (PERJETA) 420 mg in sodium chloride 0.9 % 250 mL chemo infusion, 420 mg (50 % of original dose 840 mg), Intravenous, Once, 1 of 6 cycles Dose modification: 420 mg (original dose 840 mg, Cycle 1, Reason: Provider Judgment) Administration: 420 mg (08/10/2019) fosaprepitant (  EMEND) 150 mg, dexamethasone (DECADRON) 12 mg in sodium chloride 0.9 % 145 mL IVPB, , Intravenous,  Once, 1 of 6 cycles Administration:   (08/10/2019) trastuzumab-dkst (OGIVRI) 750 mg in sodium chloride 0.9 % 250 mL chemo infusion, 714 mg, Intravenous,  Once, 1 of 6 cycles Administration: 750 mg (08/10/2019)  for chemotherapy treatment.      CHIEF COMPLIANT: Cycle 2 TCH Perjeta  INTERVAL HISTORY: Marisa Spears is a 68 y.o. with above-mentioned history of HER-2 positive right breast cancerwho underwent a mastectomy. She is currently on adjuvant chemotherapy withTCH Perjeta. She presents to the clinic todayfor cycle 2.  She had continued problems with nausea which persisted in spite of her antinausea measures.  This past week she has done much better.  We gave her IV fluids last week and she improved.  She has been instructed to take nausea medications as prophylactically and it is scheduled hours this cycle.  We will also plan to reduce the dosage of today's chemotherapy.  REVIEW OF SYSTEMS:   Constitutional: Denies fevers, chills or abnormal weight loss Eyes: Denies blurriness of vision Ears, nose, mouth, throat, and face: Denies mucositis or sore throat Respiratory: Denies cough, dyspnea or wheezes Cardiovascular: Denies palpitation, chest discomfort Gastrointestinal: Denies nausea, heartburn or change in bowel habits Skin: Denies abnormal skin rashes Lymphatics: Denies new lymphadenopathy or easy bruising Neurological: Denies numbness, tingling or new weaknesses Behavioral/Psych: Mood is stable, no new changes  Extremities: No lower extremity edema Breast: denies any pain or lumps or nodules in either breasts All other systems were reviewed with the patient and are negative.  I have reviewed the past medical history, past surgical history, social history and family history with the patient and they are unchanged from previous note.  ALLERGIES:  is allergic to codeine.  MEDICATIONS:  Current Outpatient Medications  Medication Sig Dispense Refill   aspirin EC 81 MG tablet Take 81 mg by mouth at bedtime.      Biotin  w/ Vitamins C & E (HAIR/SKIN/NAILS PO) Take 2 tablets by mouth daily.     Calcium Carb-Cholecalciferol (CALCIUM 600+D3 PO) Take 1 tablet by mouth daily.     Cholecalciferol (EQL VITAMIN D3) 50 MCG (2000 UT) CAPS Take 4,000 Units by mouth daily.     lidocaine-prilocaine (EMLA) cream Apply to affected area once 30 g 3   LORazepam (ATIVAN) 0.5 MG tablet Take 1 tablet (0.5 mg total) by mouth at bedtime as needed for sleep. 30 tablet 2   methocarbamol (ROBAXIN) 500 MG tablet Take 1 tablet (500 mg total) by mouth every 6 (six) hours as needed for muscle spasms. 30 tablet 1   Omega-3 Fatty Acids (FISH OIL ULTRA) 1400 MG CAPS Take 1,400 mg by mouth 2 (two) times daily.     ondansetron (ZOFRAN) 8 MG tablet Take 1 tablet (8 mg total) by mouth 2 (two) times daily as needed (Nausea or vomiting). Begin 4 days after chemotherapy. 30 tablet 1   pravastatin (PRAVACHOL) 40 MG tablet Take 40 mg by mouth at bedtime.      prochlorperazine (COMPAZINE) 10 MG tablet Take 1 tablet (10 mg total) by mouth every 6 (six) hours as needed (Nausea or vomiting). 30 tablet 1   traMADol (ULTRAM) 50 MG tablet Take 1 tablet (50 mg total) by mouth every 6 (six) hours as needed (mild pain). 30 tablet 0   triamterene-hydrochlorothiazide (MAXZIDE-25) 37.5-25 MG tablet Take 1 tablet by mouth daily.      venlafaxine XR (EFFEXOR XR) 37.5 MG  24 hr capsule Take 1 capsule (37.5 mg total) by mouth daily with breakfast. 30 capsule 6   No current facility-administered medications for this visit.     PHYSICAL EXAMINATION: ECOG PERFORMANCE STATUS: 1 - Symptomatic but completely ambulatory  Vitals:   08/31/19 0919  BP: (!) 153/79  Pulse: 64  Resp: 18  Temp: 98.3 F (36.8 C)  SpO2: 98%   Filed Weights   08/31/19 0919  Weight: 207 lb 12.8 oz (94.3 kg)    GENERAL: alert, no distress and comfortable SKIN: skin color, texture, turgor are normal, no rashes or significant lesions EYES: normal, Conjunctiva are pink and  non-injected, sclera clear OROPHARYNX: no exudate, no erythema and lips, buccal mucosa, and tongue normal  NECK: supple, thyroid normal size, non-tender, without nodularity LYMPH: no palpable lymphadenopathy in the cervical, axillary or inguinal LUNGS: clear to auscultation and percussion with normal breathing effort HEART: regular rate & rhythm and no murmurs and no lower extremity edema ABDOMEN: abdomen soft, non-tender and normal bowel sounds MUSCULOSKELETAL: no cyanosis of digits and no clubbing  NEURO: alert & oriented x 3 with fluent speech, no focal motor/sensory deficits EXTREMITIES: No lower extremity edema  LABORATORY DATA:  I have reviewed the data as listed CMP Latest Ref Rng & Units 08/17/2019 08/10/2019 07/14/2019  Glucose 70 - 99 mg/dL 145(H) 130(H) 122(H)  BUN 8 - 23 mg/dL '14 18 13  ' Creatinine 0.44 - 1.00 mg/dL 0.97 0.76 0.75  Sodium 135 - 145 mmol/L 133(L) 139 137  Potassium 3.5 - 5.1 mmol/L 3.4(L) 3.7 3.7  Chloride 98 - 111 mmol/L 95(L) 101 100  CO2 22 - 32 mmol/L '26 28 24  ' Calcium 8.9 - 10.3 mg/dL 9.8 10.7(H) 9.7  Total Protein 6.5 - 8.1 g/dL 7.4 7.4 -  Total Bilirubin 0.3 - 1.2 mg/dL 0.8 0.8 -  Alkaline Phos 38 - 126 U/L 88 64 -  AST 15 - 41 U/L 12(L) 12(L) -  ALT 0 - 44 U/L 10 9 -    Lab Results  Component Value Date   WBC 7.5 08/31/2019   HGB 11.2 (L) 08/31/2019   HCT 31.6 (L) 08/31/2019   MCV 87.3 08/31/2019   PLT 302 08/31/2019   NEUTROABS 6.4 08/31/2019    ASSESSMENT & PLAN:  Malignant neoplasm of upper-outer quadrant of right breast in female, estrogen receptor positive ([place]) 05/22/2019:Patient palpated a lump in the right breast. Mammogram showed 2.2cm mass in the UOQ, with no right axillary adenopathy. 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  Nausea and vomiting: We will reduce the dosage of cycle 2 2.  Diarrhea: 1-2 times per day but today she has had a couple of episodes already.  She took Imodium and that seemed to have stopped it. 3.  Fatigue due to chemotherapy 4.  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 Anxiety and depression: On  Effexor Return to clinic in 3 weeks for cycle 3    No orders of the defined types were placed in this encounter.  The patient has a good understanding of the overall plan. she agrees with it. she will call with any problems that may develop before the next visit here.  [name], MD 08/31/2019  Julious Oka Dorshimer am acting as scribe for Dr. [name].  I have reviewed the above documentation for accuracy and completeness, and I agree with the above.

## 2019-08-31 ENCOUNTER — Other Ambulatory Visit: Payer: Self-pay

## 2019-08-31 ENCOUNTER — Inpatient Hospital Stay (HOSPITAL_BASED_OUTPATIENT_CLINIC_OR_DEPARTMENT_OTHER): Payer: Medicare Other | Admitting: Hematology and Oncology

## 2019-08-31 ENCOUNTER — Inpatient Hospital Stay: Payer: Medicare Other

## 2019-08-31 ENCOUNTER — Inpatient Hospital Stay: Payer: Medicare Other | Attending: Hematology and Oncology

## 2019-08-31 ENCOUNTER — Encounter: Payer: Self-pay | Admitting: *Deleted

## 2019-08-31 DIAGNOSIS — C50411 Malignant neoplasm of upper-outer quadrant of right female breast: Secondary | ICD-10-CM

## 2019-08-31 DIAGNOSIS — Z0000 Encounter for general adult medical examination without abnormal findings: Secondary | ICD-10-CM

## 2019-08-31 DIAGNOSIS — Z5112 Encounter for antineoplastic immunotherapy: Secondary | ICD-10-CM | POA: Insufficient documentation

## 2019-08-31 DIAGNOSIS — Z5111 Encounter for antineoplastic chemotherapy: Secondary | ICD-10-CM | POA: Insufficient documentation

## 2019-08-31 DIAGNOSIS — Z95828 Presence of other vascular implants and grafts: Secondary | ICD-10-CM

## 2019-08-31 DIAGNOSIS — Z170 Estrogen receptor positive status [ER+]: Secondary | ICD-10-CM | POA: Insufficient documentation

## 2019-08-31 DIAGNOSIS — Z5189 Encounter for other specified aftercare: Secondary | ICD-10-CM | POA: Diagnosis not present

## 2019-08-31 DIAGNOSIS — Z79899 Other long term (current) drug therapy: Secondary | ICD-10-CM | POA: Insufficient documentation

## 2019-08-31 DIAGNOSIS — R112 Nausea with vomiting, unspecified: Secondary | ICD-10-CM | POA: Diagnosis not present

## 2019-08-31 DIAGNOSIS — Z23 Encounter for immunization: Secondary | ICD-10-CM | POA: Diagnosis not present

## 2019-08-31 LAB — CBC WITH DIFFERENTIAL (CANCER CENTER ONLY)
Abs Immature Granulocytes: 0.11 10*3/uL — ABNORMAL HIGH (ref 0.00–0.07)
Basophils Absolute: 0 10*3/uL (ref 0.0–0.1)
Basophils Relative: 0 %
Eosinophils Absolute: 0 10*3/uL (ref 0.0–0.5)
Eosinophils Relative: 0 %
HCT: 31.6 % — ABNORMAL LOW (ref 36.0–46.0)
Hemoglobin: 11.2 g/dL — ABNORMAL LOW (ref 12.0–15.0)
Immature Granulocytes: 2 %
Lymphocytes Relative: 8 %
Lymphs Abs: 0.6 10*3/uL — ABNORMAL LOW (ref 0.7–4.0)
MCH: 30.9 pg (ref 26.0–34.0)
MCHC: 35.4 g/dL (ref 30.0–36.0)
MCV: 87.3 fL (ref 80.0–100.0)
Monocytes Absolute: 0.3 10*3/uL (ref 0.1–1.0)
Monocytes Relative: 4 %
Neutro Abs: 6.4 10*3/uL (ref 1.7–7.7)
Neutrophils Relative %: 86 %
Platelet Count: 302 10*3/uL (ref 150–400)
RBC: 3.62 MIL/uL — ABNORMAL LOW (ref 3.87–5.11)
RDW: 13.3 % (ref 11.5–15.5)
WBC Count: 7.5 10*3/uL (ref 4.0–10.5)
nRBC: 0 % (ref 0.0–0.2)

## 2019-08-31 LAB — CMP (CANCER CENTER ONLY)
ALT: 11 U/L (ref 0–44)
AST: 14 U/L — ABNORMAL LOW (ref 15–41)
Albumin: 3.7 g/dL (ref 3.5–5.0)
Alkaline Phosphatase: 65 U/L (ref 38–126)
Anion gap: 11 (ref 5–15)
BUN: 8 mg/dL (ref 8–23)
CO2: 26 mmol/L (ref 22–32)
Calcium: 9 mg/dL (ref 8.9–10.3)
Chloride: 104 mmol/L (ref 98–111)
Creatinine: 0.74 mg/dL (ref 0.44–1.00)
GFR, Est AFR Am: >60 mL/min (ref >60)
GFR, Estimated: >60 mL/min (ref >60)
Glucose, Bld: 185 mg/dL — ABNORMAL HIGH (ref 70–99)
Potassium: 3.3 mmol/L — ABNORMAL LOW (ref 3.5–5.1)
Sodium: 141 mmol/L (ref 135–145)
Total Bilirubin: 0.6 mg/dL (ref 0.3–1.2)
Total Protein: 6.7 g/dL (ref 6.5–8.1)

## 2019-08-31 MED ORDER — LORAZEPAM 0.5 MG PO TABS: 0.5000 mg | ORAL_TABLET | Freq: Every evening | ORAL | 2 refills | Status: DC | PRN

## 2019-08-31 MED ORDER — TRASTUZUMAB-DKST CHEMO 150 MG IV SOLR
600.0000 mg | Freq: Once | INTRAVENOUS | Status: AC
  Administered 2019-08-31: 600 mg via INTRAVENOUS
  Filled 2019-08-31: qty 28.57

## 2019-08-31 MED ORDER — SODIUM CHLORIDE 0.9 % IV SOLN
60.0000 mg/m2 | Freq: Once | INTRAVENOUS | Status: AC
  Administered 2019-08-31: 130 mg via INTRAVENOUS
  Filled 2019-08-31: qty 13

## 2019-08-31 MED ORDER — INFLUENZA VAC A&B SA ADJ QUAD 0.5 ML IM PRSY
0.5000 mL | PREFILLED_SYRINGE | Freq: Once | INTRAMUSCULAR | Status: AC
  Administered 2019-08-31: 0.5 mL via INTRAMUSCULAR

## 2019-08-31 MED ORDER — INFLUENZA VAC A&B SA ADJ QUAD 0.5 ML IM PRSY
PREFILLED_SYRINGE | INTRAMUSCULAR | Status: AC
  Filled 2019-08-31: qty 0.5

## 2019-08-31 MED ORDER — SODIUM CHLORIDE 0.9 % IV SOLN
498.0000 mg | Freq: Once | INTRAVENOUS | Status: AC
  Administered 2019-08-31: 500 mg via INTRAVENOUS
  Filled 2019-08-31: qty 50

## 2019-08-31 MED ORDER — ACETAMINOPHEN 325 MG PO TABS
650.0000 mg | ORAL_TABLET | Freq: Once | ORAL | Status: AC
  Administered 2019-08-31: 650 mg via ORAL

## 2019-08-31 MED ORDER — SODIUM CHLORIDE 0.9 % IV SOLN
Freq: Once | INTRAVENOUS | Status: AC
  Administered 2019-08-31: 11:00:00 via INTRAVENOUS
  Filled 2019-08-31: qty 5

## 2019-08-31 MED ORDER — ACETAMINOPHEN 325 MG PO TABS
ORAL_TABLET | ORAL | Status: AC
  Filled 2019-08-31: qty 2

## 2019-08-31 MED ORDER — HEPARIN SOD (PORK) LOCK FLUSH 100 UNIT/ML IV SOLN
500.0000 [IU] | Freq: Once | INTRAVENOUS | Status: AC | PRN
  Administered 2019-08-31: 500 [IU]
  Filled 2019-08-31: qty 5

## 2019-08-31 MED ORDER — SODIUM CHLORIDE 0.9 % IV SOLN
Freq: Once | INTRAVENOUS | Status: AC
  Administered 2019-08-31: 10:00:00 via INTRAVENOUS
  Filled 2019-08-31: qty 250

## 2019-08-31 MED ORDER — SODIUM CHLORIDE 0.9 % IV SOLN
420.0000 mg | Freq: Once | INTRAVENOUS | Status: AC
  Administered 2019-08-31: 420 mg via INTRAVENOUS
  Filled 2019-08-31: qty 14

## 2019-08-31 MED ORDER — SODIUM CHLORIDE 0.9% FLUSH
10.0000 mL | INTRAVENOUS | Status: DC | PRN
  Administered 2019-08-31: 10 mL
  Filled 2019-08-31: qty 10

## 2019-08-31 MED ORDER — PALONOSETRON HCL INJECTION 0.25 MG/5ML
0.2500 mg | Freq: Once | INTRAVENOUS | Status: AC
  Administered 2019-08-31: 0.25 mg via INTRAVENOUS

## 2019-08-31 MED ORDER — PALONOSETRON HCL INJECTION 0.25 MG/5ML
INTRAVENOUS | Status: AC
  Filled 2019-08-31: qty 5

## 2019-08-31 MED ORDER — DIPHENHYDRAMINE HCL 25 MG PO CAPS
ORAL_CAPSULE | ORAL | Status: AC
  Filled 2019-08-31: qty 2

## 2019-08-31 MED ORDER — SODIUM CHLORIDE 0.9% FLUSH
10.0000 mL | INTRAVENOUS | Status: DC | PRN
  Administered 2019-08-31: 10 mL via INTRAVENOUS
  Filled 2019-08-31: qty 10

## 2019-08-31 MED ORDER — DIPHENHYDRAMINE HCL 25 MG PO CAPS
50.0000 mg | ORAL_CAPSULE | Freq: Once | ORAL | Status: AC
  Administered 2019-08-31: 50 mg via ORAL

## 2019-08-31 NOTE — Assessment & Plan Note (Signed)
05/22/2019:Patient palpated a lump in the right breast. Mammogram showed 2.2cm mass in the UOQ, with no right axillary adenopathy. 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  Nausea and vomiting: We will reduce the dosage of cycle 2 2.  Diarrhea: 1-2 times per day but today she has had a couple of episodes already.  She took Imodium and that seemed to have stopped it. 3.  Fatigue due to chemotherapy 4.  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 Anxiety and depression: On Effexor Return to clinic in 3 weeks for cycle 3

## 2019-08-31 NOTE — Patient Instructions (Signed)
[facility] [facility] Discharge Instructions for Patients Receiving Chemotherapy  Today you received the following chemotherapy agents:  Trastuzumab, pertuzumab, docetaxel, carboplatin  To help prevent nausea and vomiting after your treatment, we encourage you to take your nausea medication as prescribed.   If you develop nausea and vomiting that is not controlled by your nausea medication, call the clinic.   BELOW ARE SYMPTOMS THAT SHOULD BE REPORTED IMMEDIATELY:  *FEVER GREATER THAN 100.5 F  *CHILLS WITH OR WITHOUT FEVER  NAUSEA AND VOMITING THAT IS NOT CONTROLLED WITH YOUR NAUSEA MEDICATION  *UNUSUAL SHORTNESS OF BREATH  *UNUSUAL BRUISING OR BLEEDING  TENDERNESS IN MOUTH AND THROAT WITH OR WITHOUT PRESENCE OF ULCERS  *URINARY PROBLEMS  *BOWEL PROBLEMS  UNUSUAL RASH Items with * indicate a potential emergency and should be followed up as soon as possible.  Feel free to call the clinic should you have any questions or concerns. The clinic phone number is [phone].  Please show the [place] at check-in to the Emergency Department and triage nurse.

## 2019-09-02 ENCOUNTER — Other Ambulatory Visit: Payer: Self-pay

## 2019-09-02 ENCOUNTER — Inpatient Hospital Stay: Payer: Medicare Other

## 2019-09-02 VITALS — BP 128/78 | HR 62 | Temp 98.3°F | Resp 18

## 2019-09-02 DIAGNOSIS — Z170 Estrogen receptor positive status [ER+]: Secondary | ICD-10-CM

## 2019-09-02 DIAGNOSIS — C50411 Malignant neoplasm of upper-outer quadrant of right female breast: Secondary | ICD-10-CM

## 2019-09-02 DIAGNOSIS — Z5112 Encounter for antineoplastic immunotherapy: Secondary | ICD-10-CM | POA: Diagnosis not present

## 2019-09-02 MED ORDER — PEGFILGRASTIM-JMDB 6 MG/0.6ML ~~LOC~~ SOSY
6.0000 mg | PREFILLED_SYRINGE | Freq: Once | SUBCUTANEOUS | Status: AC
  Administered 2019-09-02: 6 mg via SUBCUTANEOUS

## 2019-09-02 MED ORDER — PEGFILGRASTIM-JMDB 6 MG/0.6ML ~~LOC~~ SOSY
PREFILLED_SYRINGE | SUBCUTANEOUS | Status: AC
  Filled 2019-09-02: qty 0.6

## 2019-09-20 NOTE — Progress Notes (Signed)
Patient Care Team: Earney Mallet, MD as PCP - General (Family Medicine) [name], RN as Oncology Nurse Navigator Rockwell Germany, RN as Oncology Nurse Navigator [name], MD as Consulting Physician (Hematology and Oncology) [name], MD as Consulting Physician (General Surgery) [name], MD as Attending Physician (Radiation Oncology)  DIAGNOSIS:    ICD-10-CM   1. Malignant neoplasm of upper-outer quadrant of right breast in female, estrogen receptor positive ([place])  C50.411    Z17.0     SUMMARY OF ONCOLOGIC HISTORY: Oncology History  Malignant neoplasm of upper-outer quadrant of right breast in female, estrogen receptor positive ([place])  1992 Miscellaneous   Left mastectomy followed by adjuvant chemotherapy   05/22/2019 Initial Diagnosis   Patient palpated a lump in the right breast. Mammogram showed 2.2cm mass in the UOQ, with no right axillary adenopathy. 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 The Common Hereditary Cancers Panel offered by [facility] includes sequencing and/or deletion duplication testing of the following 48 genes: APC, ATM, AXIN2, BARD1, BMPR1A, BRCA1, BRCA2, BRIP1, CDH1, CDKN2A (p14ARF), CDKN2A (p16INK4a), CKD4, CHEK2, CTNNA1, DICER1, EPCAM (Deletion/duplication testing only), GREM1 (promoter region deletion/duplication testing only), KIT, MEN1, MLH1, MSH2, MSH3, MSH6, MUTYH, NBN, NF1, NHTL1, PALB2, PDGFRA, PMS2, POLD1, POLE, PTEN, RAD50, RAD51C, RAD51D, RNF43, SDHB, SDHC, SDHD, SMAD4, SMARCA4. STK11, TP53, TSC1,  TSC2, and VHL.  The following genes were evaluated for sequence changes only: SDHA and HOXB13 c.251G>A variant only. The report date is 06/18/2019.   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   07/17/2019 Cancer Staging   Staging form: Breast, AJCC 8th Edition - Pathologic: Stage IIA (pT2, pN0, cM0, G3, ER+, PR-, HER2+) - Signed by [name], MD on 07/17/2019   08/10/2019 -  Chemotherapy   The patient had palonosetron (ALOXI) injection 0.25 mg, 0.25 mg, Intravenous,  Once, 2 of 6 cycles Administration: 0.25 mg (08/10/2019), 0.25 mg (08/31/2019) pegfilgrastim-jmdb (FULPHILA) injection 6 mg, 6 mg, Subcutaneous,  Once, 2 of 6 cycles Administration: 6 mg (08/12/2019), 6 mg (09/02/2019) CARBOplatin (PARAPLATIN) 600 mg in sodium chloride 0.9 % 250 mL chemo infusion, 600 mg (99.3 % of original dose 604.2 mg), Intravenous,  Once, 2 of 6 cycles Dose modification:   (original dose 604.2 mg, Cycle 1), 600 mg (original dose 604.2 mg, Cycle 1),   (original dose 604.2 mg, Cycle 2, Reason: Provider Judgment) Administration: 600 mg (08/10/2019), 500 mg (08/31/2019) DOCEtaxel (TAXOTERE) 140 mg in sodium chloride 0.9 % 250 mL chemo infusion, 65 mg/m2 = 140 mg (86.7 % of original dose 75 mg/m2), Intravenous,  Once, 2 of 6 cycles Dose modification: 65 mg/m2 (original dose 75 mg/m2, Cycle 1, Reason: Provider Judgment), 50 mg/m2 (original dose 75 mg/m2, Cycle 2, Reason: Provider Judgment), 60 mg/m2 (original dose 75 mg/m2, Cycle 2, Reason: Dose not tolerated) Administration: 140 mg (08/10/2019), 130 mg (08/31/2019) pertuzumab (PERJETA) 420 mg in sodium chloride 0.9 % 250 mL chemo infusion, 420 mg (50 % of original dose 840 mg), Intravenous, Once, 2 of  6 cycles Dose modification: 420 mg (original dose 840 mg, Cycle 1, Reason: Provider Judgment) Administration: 420 mg (08/10/2019), 420 mg  (08/31/2019) fosaprepitant (EMEND) 150 mg, dexamethasone (DECADRON) 12 mg in sodium chloride 0.9 % 145 mL IVPB, , Intravenous,  Once, 2 of 6 cycles Administration:  (08/10/2019),  (08/31/2019) trastuzumab-dkst (OGIVRI) 750 mg in sodium chloride 0.9 % 250 mL chemo infusion, 714 mg, Intravenous,  Once, 2 of 6 cycles Administration: 750 mg (08/10/2019), 600 mg (08/31/2019)  for chemotherapy treatment.      CHIEF COMPLIANT: Cycle 3TCH Perjeta  INTERVAL HISTORY: Marisa Spears is a 68 y.o. with above-mentioned history of HER-2 positive right breast cancerwho underwent a mastectomy. She is currently on adjuvant chemotherapy withTCH Perjeta. She presents to the clinic todayfor cycle 3.   REVIEW OF SYSTEMS:   Constitutional: Denies fevers, chills or abnormal weight loss Eyes: Denies blurriness of vision Ears, nose, mouth, throat, and face: Denies mucositis or sore throat Respiratory: Denies cough, dyspnea or wheezes Cardiovascular: Denies palpitation, chest discomfort Gastrointestinal: Denies nausea, heartburn or change in bowel habits Skin: Denies abnormal skin rashes Lymphatics: Denies new lymphadenopathy or easy bruising Neurological: Denies numbness, tingling or new weaknesses Behavioral/Psych: Mood is stable, no new changes  Extremities: No lower extremity edema Breast: denies any pain or lumps or nodules in either breasts All other systems were reviewed with the patient and are negative.  I have reviewed the past medical history, past surgical history, social history and family history with the patient and they are unchanged from previous note.  ALLERGIES:  is allergic to codeine.  MEDICATIONS:  Current Outpatient Medications  Medication Sig Dispense Refill   aspirin EC 81 MG tablet Take 81 mg by mouth at bedtime.      Biotin w/ Vitamins C & E (HAIR/SKIN/NAILS PO) Take 2 tablets by mouth daily.     Calcium Carb-Cholecalciferol (CALCIUM 600+D3 PO) Take 1 tablet by mouth daily.      Cholecalciferol (EQL VITAMIN D3) 50 MCG (2000 UT) CAPS Take 4,000 Units by mouth daily.     lidocaine-prilocaine (EMLA) cream Apply to affected area once 30 g 3   LORazepam (ATIVAN) 0.5 MG tablet Take 1 tablet (0.5 mg total) by mouth at bedtime as needed for sleep. 30 tablet 2   methocarbamol (ROBAXIN) 500 MG tablet Take 1 tablet (500 mg total) by mouth every 6 (six) hours as needed for muscle spasms. 30 tablet 1   Omega-3 Fatty Acids (FISH OIL ULTRA) 1400 MG CAPS Take 1,400 mg by mouth 2 (two) times daily.     ondansetron (ZOFRAN) 8 MG tablet Take 1 tablet (8 mg total) by mouth 2 (two) times daily as needed (Nausea or vomiting). Begin 4 days after chemotherapy. 30 tablet 1   pravastatin (PRAVACHOL) 40 MG tablet Take 40 mg by mouth at bedtime.      prochlorperazine (COMPAZINE) 10 MG tablet Take 1 tablet (10 mg total) by mouth every 6 (six) hours as needed (Nausea or vomiting). 30 tablet 1   traMADol (ULTRAM) 50 MG tablet Take 1 tablet (50 mg total) by mouth every 6 (six) hours as needed (mild pain). 30 tablet 0   triamterene-hydrochlorothiazide (MAXZIDE-25) 37.5-25 MG tablet Take 1 tablet by mouth daily.      venlafaxine XR (EFFEXOR XR) 37.5 MG 24 hr capsule Take 1 capsule (37.5 mg total) by mouth daily with breakfast. 30 capsule 6   No current facility-administered medications for this visit.     PHYSICAL EXAMINATION: ECOG PERFORMANCE STATUS: 1 -  Symptomatic but completely ambulatory  Vitals:   09/21/19 0929  BP: (!) 141/89  Pulse: 74  Resp: 17  Temp: 98.9 F (37.2 C)  SpO2: 99%   Filed Weights   09/21/19 0929  Weight: 203 lb 9.6 oz (92.4 kg)    GENERAL: alert, no distress and comfortable SKIN: skin color, texture, turgor are normal, no rashes or significant lesions EYES: normal, Conjunctiva are pink and non-injected, sclera clear OROPHARYNX: no exudate, no erythema and lips, buccal mucosa, and tongue normal  NECK: supple, thyroid normal size, non-tender, without  nodularity LYMPH: no palpable lymphadenopathy in the cervical, axillary or inguinal LUNGS: clear to auscultation and percussion with normal breathing effort HEART: regular rate & rhythm and no murmurs and no lower extremity edema ABDOMEN: abdomen soft, non-tender and normal bowel sounds MUSCULOSKELETAL: no cyanosis of digits and no clubbing  NEURO: alert & oriented x 3 with fluent speech, no focal motor/sensory deficits EXTREMITIES: No lower extremity edema  LABORATORY DATA:  I have reviewed the data as listed CMP Latest Ref Rng & Units 08/31/2019 08/17/2019 08/10/2019  Glucose 70 - 99 mg/dL 185(H) 145(H) 130(H)  BUN 8 - 23 mg/dL '8 14 18  ' Creatinine 0.44 - 1.00 mg/dL 0.74 0.97 0.76  Sodium 135 - 145 mmol/L 141 133(L) 139  Potassium 3.5 - 5.1 mmol/L 3.3(L) 3.4(L) 3.7  Chloride 98 - 111 mmol/L 104 95(L) 101  CO2 22 - 32 mmol/L '26 26 28  ' Calcium 8.9 - 10.3 mg/dL 9.0 9.8 10.7(H)  Total Protein 6.5 - 8.1 g/dL 6.7 7.4 7.4  Total Bilirubin 0.3 - 1.2 mg/dL 0.6 0.8 0.8  Alkaline Phos 38 - 126 U/L 65 88 64  AST 15 - 41 U/L 14(L) 12(L) 12(L)  ALT 0 - 44 U/L '11 10 9    ' Lab Results  Component Value Date   WBC 7.5 09/21/2019   HGB 11.3 (L) 09/21/2019   HCT 32.3 (L) 09/21/2019   MCV 89.7 09/21/2019   PLT 290 09/21/2019   NEUTROABS 6.6 09/21/2019    ASSESSMENT & PLAN:  Malignant neoplasm of upper-outer quadrant of right breast in female, estrogen receptor positive ([place]) 05/22/2019:Patient palpated a lump in the right breast. Mammogram showed 2.2cm mass in the UOQ, with no right axillary adenopathy. 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 She took Imodium and that seemed to have stopped it. 3.Fatigue due to chemotherapy 4.  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 Anxiety and depression: On Effexor Return to clinic in 3 weeks for cycle 4    No orders of the defined types were placed in this encounter.  The patient has a good understanding of the overall plan. she agrees with it. she will call with any  problems that may develop before the next visit here.  [name], MD 09/21/2019  Julious Oka Dorshimer am acting as scribe for Dr. [name].  I have reviewed the above documentation for accuracy and completeness, and I agree with the above.

## 2019-09-21 ENCOUNTER — Inpatient Hospital Stay: Payer: Medicare Other

## 2019-09-21 ENCOUNTER — Encounter: Payer: Self-pay | Admitting: *Deleted

## 2019-09-21 ENCOUNTER — Inpatient Hospital Stay (HOSPITAL_BASED_OUTPATIENT_CLINIC_OR_DEPARTMENT_OTHER): Payer: Medicare Other | Admitting: Hematology and Oncology

## 2019-09-21 ENCOUNTER — Other Ambulatory Visit: Payer: Self-pay

## 2019-09-21 VITALS — BP 125/79 | HR 62 | Temp 98.0°F | Resp 16

## 2019-09-21 DIAGNOSIS — Z170 Estrogen receptor positive status [ER+]: Secondary | ICD-10-CM

## 2019-09-21 DIAGNOSIS — Z5112 Encounter for antineoplastic immunotherapy: Secondary | ICD-10-CM | POA: Diagnosis not present

## 2019-09-21 DIAGNOSIS — Z95828 Presence of other vascular implants and grafts: Secondary | ICD-10-CM

## 2019-09-21 DIAGNOSIS — C50411 Malignant neoplasm of upper-outer quadrant of right female breast: Secondary | ICD-10-CM

## 2019-09-21 LAB — CMP (CANCER CENTER ONLY)
ALT: 11 U/L (ref 0–44)
AST: 13 U/L — ABNORMAL LOW (ref 15–41)
Albumin: 3.9 g/dL (ref 3.5–5.0)
Alkaline Phosphatase: 60 U/L (ref 38–126)
Anion gap: 11 (ref 5–15)
BUN: 7 mg/dL — ABNORMAL LOW (ref 8–23)
CO2: 24 mmol/L (ref 22–32)
Calcium: 9.3 mg/dL (ref 8.9–10.3)
Chloride: 104 mmol/L (ref 98–111)
Creatinine: 0.68 mg/dL (ref 0.44–1.00)
GFR, Est AFR Am: >60 mL/min (ref >60)
GFR, Estimated: >60 mL/min (ref >60)
Glucose, Bld: 152 mg/dL — ABNORMAL HIGH (ref 70–99)
Potassium: 4 mmol/L (ref 3.5–5.1)
Sodium: 139 mmol/L (ref 135–145)
Total Bilirubin: 0.7 mg/dL (ref 0.3–1.2)
Total Protein: 6.9 g/dL (ref 6.5–8.1)

## 2019-09-21 LAB — CBC WITH DIFFERENTIAL (CANCER CENTER ONLY)
Abs Immature Granulocytes: 0.05 10*3/uL (ref 0.00–0.07)
Basophils Absolute: 0 10*3/uL (ref 0.0–0.1)
Basophils Relative: 0 %
Eosinophils Absolute: 0 10*3/uL (ref 0.0–0.5)
Eosinophils Relative: 0 %
HCT: 32.3 % — ABNORMAL LOW (ref 36.0–46.0)
Hemoglobin: 11.3 g/dL — ABNORMAL LOW (ref 12.0–15.0)
Immature Granulocytes: 1 %
Lymphocytes Relative: 7 %
Lymphs Abs: 0.5 10*3/uL — ABNORMAL LOW (ref 0.7–4.0)
MCH: 31.4 pg (ref 26.0–34.0)
MCHC: 35 g/dL (ref 30.0–36.0)
MCV: 89.7 fL (ref 80.0–100.0)
Monocytes Absolute: 0.3 10*3/uL (ref 0.1–1.0)
Monocytes Relative: 4 %
Neutro Abs: 6.6 10*3/uL (ref 1.7–7.7)
Neutrophils Relative %: 88 %
Platelet Count: 290 10*3/uL (ref 150–400)
RBC: 3.6 MIL/uL — ABNORMAL LOW (ref 3.87–5.11)
RDW: 15.7 % — ABNORMAL HIGH (ref 11.5–15.5)
WBC Count: 7.5 10*3/uL (ref 4.0–10.5)
nRBC: 0 % (ref 0.0–0.2)

## 2019-09-21 MED ORDER — SODIUM CHLORIDE 0.9 % IV SOLN
420.0000 mg | Freq: Once | INTRAVENOUS | Status: AC
  Administered 2019-09-21: 420 mg via INTRAVENOUS
  Filled 2019-09-21: qty 14

## 2019-09-21 MED ORDER — ACETAMINOPHEN 325 MG PO TABS
ORAL_TABLET | ORAL | Status: AC
  Filled 2019-09-21: qty 2

## 2019-09-21 MED ORDER — SODIUM CHLORIDE 0.9 % IV SOLN
60.0000 mg/m2 | Freq: Once | INTRAVENOUS | Status: AC
  Administered 2019-09-21: 13:00:00 130 mg via INTRAVENOUS
  Filled 2019-09-21: qty 13

## 2019-09-21 MED ORDER — SODIUM CHLORIDE 0.9 % IV SOLN
Freq: Once | INTRAVENOUS | Status: AC
  Administered 2019-09-21: 10:00:00 via INTRAVENOUS
  Filled 2019-09-21: qty 250

## 2019-09-21 MED ORDER — HEPARIN SOD (PORK) LOCK FLUSH 100 UNIT/ML IV SOLN
500.0000 [IU] | Freq: Once | INTRAVENOUS | Status: AC | PRN
  Administered 2019-09-21: 15:00:00 500 [IU]
  Filled 2019-09-21: qty 5

## 2019-09-21 MED ORDER — PALONOSETRON HCL INJECTION 0.25 MG/5ML
INTRAVENOUS | Status: AC
  Filled 2019-09-21: qty 5

## 2019-09-21 MED ORDER — PALONOSETRON HCL INJECTION 0.25 MG/5ML
0.2500 mg | Freq: Once | INTRAVENOUS | Status: AC
  Administered 2019-09-21: 10:00:00 0.25 mg via INTRAVENOUS

## 2019-09-21 MED ORDER — DIPHENHYDRAMINE HCL 25 MG PO CAPS
50.0000 mg | ORAL_CAPSULE | Freq: Once | ORAL | Status: AC
  Administered 2019-09-21: 10:00:00 50 mg via ORAL

## 2019-09-21 MED ORDER — SODIUM CHLORIDE 0.9 % IV SOLN
Freq: Once | INTRAVENOUS | Status: AC
  Administered 2019-09-21: 10:00:00 via INTRAVENOUS
  Filled 2019-09-21: qty 5

## 2019-09-21 MED ORDER — SODIUM CHLORIDE 0.9 % IV SOLN
498.0000 mg | Freq: Once | INTRAVENOUS | Status: AC
  Administered 2019-09-21: 14:00:00 500 mg via INTRAVENOUS
  Filled 2019-09-21: qty 50

## 2019-09-21 MED ORDER — ACETAMINOPHEN 325 MG PO TABS
650.0000 mg | ORAL_TABLET | Freq: Once | ORAL | Status: AC
  Administered 2019-09-21: 10:00:00 650 mg via ORAL

## 2019-09-21 MED ORDER — SODIUM CHLORIDE 0.9% FLUSH
10.0000 mL | INTRAVENOUS | Status: DC | PRN
  Administered 2019-09-21: 10 mL via INTRAVENOUS
  Filled 2019-09-21: qty 10

## 2019-09-21 MED ORDER — TRASTUZUMAB-DKST CHEMO 150 MG IV SOLR
600.0000 mg | Freq: Once | INTRAVENOUS | Status: AC
  Administered 2019-09-21: 600 mg via INTRAVENOUS
  Filled 2019-09-21: qty 28.57

## 2019-09-21 MED ORDER — SODIUM CHLORIDE 0.9% FLUSH
10.0000 mL | INTRAVENOUS | Status: DC | PRN
  Administered 2019-09-21: 10 mL
  Filled 2019-09-21: qty 10

## 2019-09-21 MED ORDER — DIPHENHYDRAMINE HCL 25 MG PO CAPS
ORAL_CAPSULE | ORAL | Status: AC
  Filled 2019-09-21: qty 2

## 2019-09-21 NOTE — Patient Instructions (Signed)

## 2019-09-21 NOTE — Patient Instructions (Signed)
[facility] [facility] Discharge Instructions for Patients Receiving Chemotherapy  Today you received the following chemotherapy agents:  Trastuzumab, pertuzumab, docetaxel, carboplatin  To help prevent nausea and vomiting after your treatment, we encourage you to take your nausea medication as prescribed.   If you develop nausea and vomiting that is not controlled by your nausea medication, call the clinic.   BELOW ARE SYMPTOMS THAT SHOULD BE REPORTED IMMEDIATELY:  *FEVER GREATER THAN 100.5 F  *CHILLS WITH OR WITHOUT FEVER  NAUSEA AND VOMITING THAT IS NOT CONTROLLED WITH YOUR NAUSEA MEDICATION  *UNUSUAL SHORTNESS OF BREATH  *UNUSUAL BRUISING OR BLEEDING  TENDERNESS IN MOUTH AND THROAT WITH OR WITHOUT PRESENCE OF ULCERS  *URINARY PROBLEMS  *BOWEL PROBLEMS  UNUSUAL RASH Items with * indicate a potential emergency and should be followed up as soon as possible.  Feel free to call the clinic should you have any questions or concerns. The clinic phone number is [phone].  Please show the [place] at check-in to the Emergency Department and triage nurse.

## 2019-09-21 NOTE — Assessment & Plan Note (Signed)
05/22/2019:Patient palpated a lump in the right breast. Mammogram showed 2.2cm mass in the UOQ, with no right axillary adenopathy. 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 She took Imodium and that seemed to have stopped it. 3.Fatigue due to chemotherapy 4.  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 Anxiety and depression: On Effexor Return to clinic in 3 weeks for cycle 4

## 2019-09-23 ENCOUNTER — Inpatient Hospital Stay: Payer: Medicare Other

## 2019-09-23 ENCOUNTER — Other Ambulatory Visit: Payer: Self-pay

## 2019-09-23 VITALS — BP 130/88 | HR 77 | Temp 97.8°F | Resp 18

## 2019-09-23 DIAGNOSIS — Z5112 Encounter for antineoplastic immunotherapy: Secondary | ICD-10-CM | POA: Diagnosis not present

## 2019-09-23 DIAGNOSIS — C50411 Malignant neoplasm of upper-outer quadrant of right female breast: Secondary | ICD-10-CM

## 2019-09-23 DIAGNOSIS — Z170 Estrogen receptor positive status [ER+]: Secondary | ICD-10-CM

## 2019-09-23 MED ORDER — PEGFILGRASTIM-JMDB 6 MG/0.6ML ~~LOC~~ SOSY
6.0000 mg | PREFILLED_SYRINGE | Freq: Once | SUBCUTANEOUS | Status: AC
  Administered 2019-09-23: 6 mg via SUBCUTANEOUS

## 2019-09-23 MED ORDER — PEGFILGRASTIM-JMDB 6 MG/0.6ML ~~LOC~~ SOSY
PREFILLED_SYRINGE | SUBCUTANEOUS | Status: AC
  Filled 2019-09-23: qty 0.6

## 2019-10-11 NOTE — Progress Notes (Signed)
Patient Care Team: Earney Mallet, MD as PCP - General (Family Medicine) [name], RN as Oncology Nurse Navigator Rockwell Germany, RN as Oncology Nurse Navigator [name], MD as Consulting Physician (Hematology and Oncology) [name], MD as Consulting Physician (General Surgery) [name], MD as Attending Physician (Radiation Oncology)  DIAGNOSIS:    ICD-10-CM   1. Malignant neoplasm of upper-outer quadrant of right breast in female, estrogen receptor positive ([place])  C50.411    Z17.0     SUMMARY OF ONCOLOGIC HISTORY: Oncology History  Malignant neoplasm of upper-outer quadrant of right breast in female, estrogen receptor positive ([place])  1992 Miscellaneous   Left mastectomy followed by adjuvant chemotherapy   05/22/2019 Initial Diagnosis   Patient palpated a lump in the right breast. Mammogram showed 2.2cm mass in the UOQ, with no right axillary adenopathy. 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 The Common Hereditary Cancers Panel offered by [facility] includes sequencing and/or deletion duplication testing of the following 48 genes: APC, ATM, AXIN2, BARD1, BMPR1A, BRCA1, BRCA2, BRIP1, CDH1, CDKN2A (p14ARF), CDKN2A (p16INK4a), CKD4, CHEK2, CTNNA1, DICER1, EPCAM (Deletion/duplication testing only), GREM1 (promoter region deletion/duplication testing only), KIT, MEN1, MLH1, MSH2, MSH3, MSH6, MUTYH, NBN, NF1, NHTL1, PALB2, PDGFRA, PMS2, POLD1, POLE, PTEN, RAD50, RAD51C, RAD51D, RNF43, SDHB, SDHC, SDHD, SMAD4, SMARCA4. STK11, TP53, TSC1,  TSC2, and VHL.  The following genes were evaluated for sequence changes only: SDHA and HOXB13 c.251G>A variant only. The report date is 06/18/2019.   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   07/17/2019 Cancer Staging   Staging form: Breast, AJCC 8th Edition - Pathologic: Stage IIA (pT2, pN0, cM0, G3, ER+, PR-, HER2+) - Signed by [name], MD on 07/17/2019   08/10/2019 -  Chemotherapy   The patient had palonosetron (ALOXI) injection 0.25 mg, 0.25 mg, Intravenous,  Once, 3 of 6 cycles Administration: 0.25 mg (08/10/2019), 0.25 mg (08/31/2019), 0.25 mg (09/21/2019) pegfilgrastim-jmdb (FULPHILA) injection 6 mg, 6 mg, Subcutaneous,  Once, 3 of 6 cycles Administration: 6 mg (08/12/2019), 6 mg (09/02/2019), 6 mg (09/23/2019) CARBOplatin (PARAPLATIN) 600 mg in sodium chloride 0.9 % 250 mL chemo infusion, 600 mg (99.3 % of original dose 604.2 mg), Intravenous,  Once, 3 of 6 cycles Dose modification:   (original dose 604.2 mg, Cycle 1), 600 mg (original dose 604.2 mg, Cycle 1),   (original dose 604.2 mg, Cycle 2, Reason: Provider Judgment) Administration: 600 mg (08/10/2019), 500 mg (08/31/2019), 500 mg (09/21/2019) DOCEtaxel (TAXOTERE) 140 mg in sodium chloride 0.9 % 250 mL chemo infusion, 65 mg/m2 = 140 mg (86.7 % of original dose 75 mg/m2), Intravenous,  Once, 3 of 6 cycles Dose modification: 65 mg/m2 (original dose 75 mg/m2, Cycle 1, Reason: Provider Judgment), 50 mg/m2 (original dose 75 mg/m2, Cycle 2, Reason: Provider Judgment), 60 mg/m2 (original dose 75 mg/m2, Cycle 2, Reason: Dose not tolerated) Administration: 140 mg (08/10/2019), 130 mg (08/31/2019), 130 mg (09/21/2019) pertuzumab (PERJETA) 420 mg in sodium chloride 0.9 % 250 mL chemo infusion, 420  mg (50 % of original dose 840 mg), Intravenous, Once, 3 of 6 cycles Dose modification: 420 mg (original dose 840 mg, Cycle 1, Reason:  Provider Judgment) Administration: 420 mg (08/10/2019), 420 mg (08/31/2019), 420 mg (09/21/2019) fosaprepitant (EMEND) 150 mg, dexamethasone (DECADRON) 12 mg in sodium chloride 0.9 % 145 mL IVPB, , Intravenous,  Once, 3 of 6 cycles Administration:  (08/10/2019),  (08/31/2019),  (09/21/2019) trastuzumab-dkst (OGIVRI) 750 mg in sodium chloride 0.9 % 250 mL chemo infusion, 714 mg, Intravenous,  Once, 3 of 6 cycles Administration: 750 mg (08/10/2019), 600 mg (08/31/2019), 600 mg (09/21/2019)  for chemotherapy treatment.      CHIEF COMPLIANT: Cycle 4TCH Perjeta  INTERVAL HISTORY: Marisa Spears is a 68 y.o. with above-mentioned history of HER-2 positive right breast cancerwho underwent a mastectomy. She is currently on adjuvant chemotherapy withTCH Perjeta. She presents to the clinic todayforcycle 4.   She has had profound diarrhea that was going on for the past 2 weeks.  She has been taking 4-5 Imodium's every day.  She is so afraid to leave the hospital because of diarrhea.  Nausea is markedly better.  She does have loose in the arms.  REVIEW OF SYSTEMS:   Constitutional: Denies fevers, chills or abnormal weight loss Eyes: Denies blurriness of vision Ears, nose, mouth, throat, and face: Denies mucositis or sore throat Respiratory: Denies cough, dyspnea or wheezes Cardiovascular: Denies palpitation, chest discomfort Gastrointestinal: Diarrhea profound, nausea has improved Skin: Bruises on the forearms Lymphatics: Denies new lymphadenopathy or easy bruising Neurological: Denies numbness, tingling or new weaknesses Behavioral/Psych: Mood is stable, no new changes  Extremities: No lower extremity edema Breast: denies any pain or lumps or nodules in either breasts All other systems were reviewed with the patient and are negative.  I have reviewed the past medical history, past surgical history, social history and family history with the patient and they are unchanged from previous  note.  ALLERGIES:  is allergic to codeine.  MEDICATIONS:  Current Outpatient Medications  Medication Sig Dispense Refill   aspirin EC 81 MG tablet Take 81 mg by mouth at bedtime.      Biotin w/ Vitamins C & E (HAIR/SKIN/NAILS PO) Take 2 tablets by mouth daily.     Calcium Carb-Cholecalciferol (CALCIUM 600+D3 PO) Take 1 tablet by mouth daily.     Cholecalciferol (EQL VITAMIN D3) 50 MCG (2000 UT) CAPS Take 4,000 Units by mouth daily.     diphenoxylate-atropine (LOMOTIL) 2.5-0.025 MG tablet Take 1 tablet by mouth 2 (two) times daily. 60 tablet 3   lidocaine-prilocaine (EMLA) cream Apply to affected area once 30 g 3   LORazepam (ATIVAN) 0.5 MG tablet Take 1 tablet (0.5 mg total) by mouth at bedtime as needed for sleep. 30 tablet 2   methocarbamol (ROBAXIN) 500 MG tablet Take 1 tablet (500 mg total) by mouth every 6 (six) hours as needed for muscle spasms. 30 tablet 1   Omega-3 Fatty Acids (FISH OIL ULTRA) 1400 MG CAPS Take 1,400 mg by mouth 2 (two) times daily.     ondansetron (ZOFRAN) 8 MG tablet Take 1 tablet (8 mg total) by mouth 2 (two) times daily as needed (Nausea or vomiting). Begin 4 days after chemotherapy. 30 tablet 1   pravastatin (PRAVACHOL) 40 MG tablet Take 40 mg by mouth at bedtime.      prochlorperazine (COMPAZINE) 10 MG tablet Take 1 tablet (10 mg total) by mouth every 6 (six) hours as needed (Nausea or vomiting). 30 tablet 1   traMADol (  ULTRAM) 50 MG tablet Take 1 tablet (50 mg total) by mouth every 6 (six) hours as needed (mild pain). 30 tablet 0   triamterene-hydrochlorothiazide (MAXZIDE-25) 37.5-25 MG tablet Take 1 tablet by mouth daily.      venlafaxine XR (EFFEXOR XR) 37.5 MG 24 hr capsule Take 1 capsule (37.5 mg total) by mouth daily with breakfast. 30 capsule 6   No current facility-administered medications for this visit.     PHYSICAL EXAMINATION: ECOG PERFORMANCE STATUS: 1 - Symptomatic but completely ambulatory  Vitals:   10/12/19 1119  BP: (!)  141/76  Pulse: 71  Resp: 18  Temp: 98.7 F (37.1 C)  SpO2: 99%   Filed Weights   10/12/19 1119  Weight: 203 lb 6.4 oz (92.3 kg)    GENERAL: alert, no distress and comfortable SKIN: skin color, texture, turgor are normal, no rashes or significant lesions EYES: normal, Conjunctiva are pink and non-injected, sclera clear OROPHARYNX: no exudate, no erythema and lips, buccal mucosa, and tongue normal  NECK: supple, thyroid normal size, non-tender, without nodularity LYMPH: no palpable lymphadenopathy in the cervical, axillary or inguinal LUNGS: clear to auscultation and percussion with normal breathing effort HEART: regular rate & rhythm and no murmurs and no lower extremity edema ABDOMEN: abdomen soft, non-tender and normal bowel sounds MUSCULOSKELETAL: no cyanosis of digits and no clubbing  NEURO: alert & oriented x 3 with fluent speech, no focal motor/sensory deficits EXTREMITIES: No lower extremity edema  LABORATORY DATA:  I have reviewed the data as listed CMP Latest Ref Rng & Units 09/21/2019 08/31/2019 08/17/2019  Glucose 70 - 99 mg/dL 152(H) 185(H) 145(H)  BUN 8 - 23 mg/dL 7(L) 8 14  Creatinine 0.44 - 1.00 mg/dL 0.68 0.74 0.97  Sodium 135 - 145 mmol/L 139 141 133(L)  Potassium 3.5 - 5.1 mmol/L 4.0 3.3(L) 3.4(L)  Chloride 98 - 111 mmol/L 104 104 95(L)  CO2 22 - 32 mmol/L _0 Calcium 8.9 - 10.3 mg/dL 9.3 9.0 9.8  Total Protein 6.5 - 8.1 g/dL 6.9 6.7 7.4  Total Bilirubin 0.3 - 1.2 mg/dL 0.7 0.6 0.8  Alkaline Phos 38 - 126 U/L 60 65 88  AST 15 - 41 U/L 13(L) 14(L) 12(L)  ALT 0 - 44 U/L _1 Lab Results  Component Value Date   WBC 6.5 10/12/2019   HGB 10.7 (L) 10/12/2019   HCT 31.2 (L) 10/12/2019   MCV 94.5 10/12/2019   PLT 271 10/12/2019   NEUTROABS 4.3 10/12/2019    ASSESSMENT & PLAN:  Malignant neoplasm of upper-outer quadrant of right breast in female, estrogen receptor positive ([place]) 05/22/2019:Patient palpated a lump in the right breast. Mammogram  showed 2.2cm mass in the UOQ, with no right axillary adenopathy. 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  Due to Perjeta.  I gave her prescription for Lomotil today. 3.Fatigue due to chemotherapy    Monitoring closely for toxicities. Anxiety and depression: On Effexor Return to clinic in3 weeks for cycle 5    No orders of the defined types were placed in this encounter.  The patient has a good understanding of the overall plan. she agrees with it. she will call with any problems that may develop before the next visit here.  [name], MD 10/12/2019  Julious Oka Dorshimer, am acting as scribe for Dr. [name].  I have reviewed the above documentation for accuracy and completeness, and I agree with the above.

## 2019-10-12 ENCOUNTER — Inpatient Hospital Stay: Payer: Medicare Other | Attending: Hematology and Oncology | Admitting: Hematology and Oncology

## 2019-10-12 ENCOUNTER — Other Ambulatory Visit: Payer: Self-pay

## 2019-10-12 ENCOUNTER — Encounter: Payer: Self-pay | Admitting: *Deleted

## 2019-10-12 ENCOUNTER — Inpatient Hospital Stay: Payer: Medicare Other

## 2019-10-12 DIAGNOSIS — C50411 Malignant neoplasm of upper-outer quadrant of right female breast: Secondary | ICD-10-CM

## 2019-10-12 DIAGNOSIS — Z95828 Presence of other vascular implants and grafts: Secondary | ICD-10-CM

## 2019-10-12 DIAGNOSIS — Z5189 Encounter for other specified aftercare: Secondary | ICD-10-CM | POA: Diagnosis not present

## 2019-10-12 DIAGNOSIS — Z170 Estrogen receptor positive status [ER+]: Secondary | ICD-10-CM

## 2019-10-12 DIAGNOSIS — Z5111 Encounter for antineoplastic chemotherapy: Secondary | ICD-10-CM | POA: Insufficient documentation

## 2019-10-12 DIAGNOSIS — Z79899 Other long term (current) drug therapy: Secondary | ICD-10-CM | POA: Diagnosis not present

## 2019-10-12 DIAGNOSIS — Z5112 Encounter for antineoplastic immunotherapy: Secondary | ICD-10-CM | POA: Insufficient documentation

## 2019-10-12 LAB — CBC WITH DIFFERENTIAL (CANCER CENTER ONLY)
Abs Immature Granulocytes: 0.04 10*3/uL (ref 0.00–0.07)
Basophils Absolute: 0.1 10*3/uL (ref 0.0–0.1)
Basophils Relative: 1 %
Eosinophils Absolute: 0 10*3/uL (ref 0.0–0.5)
Eosinophils Relative: 0 %
HCT: 31.2 % — ABNORMAL LOW (ref 36.0–46.0)
Hemoglobin: 10.7 g/dL — ABNORMAL LOW (ref 12.0–15.0)
Immature Granulocytes: 1 %
Lymphocytes Relative: 24 %
Lymphs Abs: 1.6 10*3/uL (ref 0.7–4.0)
MCH: 32.4 pg (ref 26.0–34.0)
MCHC: 34.3 g/dL (ref 30.0–36.0)
MCV: 94.5 fL (ref 80.0–100.0)
Monocytes Absolute: 0.5 10*3/uL (ref 0.1–1.0)
Monocytes Relative: 8 %
Neutro Abs: 4.3 10*3/uL (ref 1.7–7.7)
Neutrophils Relative %: 66 %
Platelet Count: 271 10*3/uL (ref 150–400)
RBC: 3.3 MIL/uL — ABNORMAL LOW (ref 3.87–5.11)
RDW: 16.6 % — ABNORMAL HIGH (ref 11.5–15.5)
WBC Count: 6.5 10*3/uL (ref 4.0–10.5)
nRBC: 0 % (ref 0.0–0.2)

## 2019-10-12 LAB — CMP (CANCER CENTER ONLY)
ALT: 10 U/L (ref 0–44)
AST: 13 U/L — ABNORMAL LOW (ref 15–41)
Albumin: 3.8 g/dL (ref 3.5–5.0)
Alkaline Phosphatase: 71 U/L (ref 38–126)
Anion gap: 10 (ref 5–15)
BUN: 8 mg/dL (ref 8–23)
CO2: 26 mmol/L (ref 22–32)
Calcium: 9.1 mg/dL (ref 8.9–10.3)
Chloride: 104 mmol/L (ref 98–111)
Creatinine: 0.7 mg/dL (ref 0.44–1.00)
GFR, Est AFR Am: >60 mL/min (ref >60)
GFR, Estimated: >60 mL/min (ref >60)
Glucose, Bld: 107 mg/dL — ABNORMAL HIGH (ref 70–99)
Potassium: 4.1 mmol/L (ref 3.5–5.1)
Sodium: 140 mmol/L (ref 135–145)
Total Bilirubin: 0.6 mg/dL (ref 0.3–1.2)
Total Protein: 6.7 g/dL (ref 6.5–8.1)

## 2019-10-12 MED ORDER — DIPHENHYDRAMINE HCL 25 MG PO CAPS
50.0000 mg | ORAL_CAPSULE | Freq: Once | ORAL | Status: AC
  Administered 2019-10-12: 50 mg via ORAL

## 2019-10-12 MED ORDER — TRASTUZUMAB-DKST CHEMO 150 MG IV SOLR
6.0000 mg/kg | Freq: Once | INTRAVENOUS | Status: AC
  Administered 2019-10-12: 546 mg via INTRAVENOUS
  Filled 2019-10-12: qty 26

## 2019-10-12 MED ORDER — DIPHENHYDRAMINE HCL 25 MG PO CAPS
ORAL_CAPSULE | ORAL | Status: AC
  Filled 2019-10-12: qty 2

## 2019-10-12 MED ORDER — HEPARIN SOD (PORK) LOCK FLUSH 100 UNIT/ML IV SOLN
500.0000 [IU] | Freq: Once | INTRAVENOUS | Status: AC | PRN
  Administered 2019-10-12: 500 [IU]
  Filled 2019-10-12: qty 5

## 2019-10-12 MED ORDER — SODIUM CHLORIDE 0.9 % IV SOLN
500.0000 mg | Freq: Once | INTRAVENOUS | Status: AC
  Administered 2019-10-12: 500 mg via INTRAVENOUS
  Filled 2019-10-12: qty 50

## 2019-10-12 MED ORDER — SODIUM CHLORIDE 0.9 % IV SOLN
Freq: Once | INTRAVENOUS | Status: AC
  Administered 2019-10-12: 13:00:00 via INTRAVENOUS
  Filled 2019-10-12: qty 5

## 2019-10-12 MED ORDER — ACETAMINOPHEN 325 MG PO TABS
650.0000 mg | ORAL_TABLET | Freq: Once | ORAL | Status: AC
  Administered 2019-10-12: 650 mg via ORAL

## 2019-10-12 MED ORDER — ACETAMINOPHEN 325 MG PO TABS
ORAL_TABLET | ORAL | Status: AC
  Filled 2019-10-12: qty 2

## 2019-10-12 MED ORDER — SODIUM CHLORIDE 0.9 % IV SOLN
60.0000 mg/m2 | Freq: Once | INTRAVENOUS | Status: AC
  Administered 2019-10-12: 130 mg via INTRAVENOUS
  Filled 2019-10-12: qty 13

## 2019-10-12 MED ORDER — PALONOSETRON HCL INJECTION 0.25 MG/5ML
INTRAVENOUS | Status: AC
  Filled 2019-10-12: qty 5

## 2019-10-12 MED ORDER — DIPHENOXYLATE-ATROPINE 2.5-0.025 MG PO TABS: 1.0000 | ORAL_TABLET | Freq: Two times a day (BID) | ORAL | 3 refills | Status: DC

## 2019-10-12 MED ORDER — SODIUM CHLORIDE 0.9% FLUSH
10.0000 mL | INTRAVENOUS | Status: DC | PRN
  Administered 2019-10-12: 10 mL
  Filled 2019-10-12: qty 10

## 2019-10-12 MED ORDER — PALONOSETRON HCL INJECTION 0.25 MG/5ML
0.2500 mg | Freq: Once | INTRAVENOUS | Status: AC
  Administered 2019-10-12: 0.25 mg via INTRAVENOUS

## 2019-10-12 MED ORDER — SODIUM CHLORIDE 0.9% FLUSH
10.0000 mL | INTRAVENOUS | Status: DC | PRN
  Administered 2019-10-12: 11:00:00 10 mL
  Filled 2019-10-12: qty 10

## 2019-10-12 MED ORDER — SODIUM CHLORIDE 0.9 % IV SOLN
420.0000 mg | Freq: Once | INTRAVENOUS | Status: AC
  Administered 2019-10-12: 420 mg via INTRAVENOUS
  Filled 2019-10-12: qty 14

## 2019-10-12 MED ORDER — SODIUM CHLORIDE 0.9 % IV SOLN
Freq: Once | INTRAVENOUS | Status: AC
  Administered 2019-10-12: 12:00:00 via INTRAVENOUS
  Filled 2019-10-12: qty 250

## 2019-10-12 NOTE — Patient Instructions (Signed)
[facility] [facility] Discharge Instructions for Patients Receiving Chemotherapy  Today you received the following chemotherapy agents:  Trastuzumab, pertuzumab, docetaxel, carboplatin  To help prevent nausea and vomiting after your treatment, we encourage you to take your nausea medication as prescribed.   If you develop nausea and vomiting that is not controlled by your nausea medication, call the clinic.   BELOW ARE SYMPTOMS THAT SHOULD BE REPORTED IMMEDIATELY:  *FEVER GREATER THAN 100.5 F  *CHILLS WITH OR WITHOUT FEVER  NAUSEA AND VOMITING THAT IS NOT CONTROLLED WITH YOUR NAUSEA MEDICATION  *UNUSUAL SHORTNESS OF BREATH  *UNUSUAL BRUISING OR BLEEDING  TENDERNESS IN MOUTH AND THROAT WITH OR WITHOUT PRESENCE OF ULCERS  *URINARY PROBLEMS  *BOWEL PROBLEMS  UNUSUAL RASH Items with * indicate a potential emergency and should be followed up as soon as possible.  Feel free to call the clinic should you have any questions or concerns. The clinic phone number is [phone].  Please show the [place] at check-in to the Emergency Department and triage nurse.

## 2019-10-12 NOTE — Assessment & Plan Note (Signed)
05/22/2019:Patient palpated a lump in the right breast. Mammogram showed 2.2cm mass in the UOQ, with no right axillary adenopathy. 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 She took Imodium and that seemed to have stopped it. 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 Anxiety and depression: On Effexor Return to clinic in3 weeks for cycle 5

## 2019-10-14 ENCOUNTER — Other Ambulatory Visit: Payer: Self-pay

## 2019-10-14 ENCOUNTER — Inpatient Hospital Stay: Payer: Medicare Other

## 2019-10-14 VITALS — BP 119/73 | HR 70 | Temp 98.5°F | Resp 18

## 2019-10-14 DIAGNOSIS — Z170 Estrogen receptor positive status [ER+]: Secondary | ICD-10-CM

## 2019-10-14 DIAGNOSIS — C50411 Malignant neoplasm of upper-outer quadrant of right female breast: Secondary | ICD-10-CM

## 2019-10-14 DIAGNOSIS — Z5112 Encounter for antineoplastic immunotherapy: Secondary | ICD-10-CM | POA: Diagnosis not present

## 2019-10-14 MED ORDER — PEGFILGRASTIM-JMDB 6 MG/0.6ML ~~LOC~~ SOSY
6.0000 mg | PREFILLED_SYRINGE | Freq: Once | SUBCUTANEOUS | Status: AC
  Administered 2019-10-14: 6 mg via SUBCUTANEOUS

## 2019-10-14 MED ORDER — PEGFILGRASTIM-JMDB 6 MG/0.6ML ~~LOC~~ SOSY
PREFILLED_SYRINGE | SUBCUTANEOUS | Status: AC
  Filled 2019-10-14: qty 0.6

## 2019-10-14 NOTE — Patient Instructions (Signed)

## 2019-11-01 NOTE — Progress Notes (Signed)
Patient Care Team: Earney Mallet, MD as PCP - General (Family Medicine) [name], RN as Oncology Nurse Navigator Rockwell Germany, RN as Oncology Nurse Navigator [name], MD as Consulting Physician (Hematology and Oncology) [name], MD as Consulting Physician (General Surgery) [name], MD as Attending Physician (Radiation Oncology)  DIAGNOSIS:    ICD-10-CM   1. Encounter for antineoplastic chemotherapy  Z51.11   2. Malignant neoplasm of upper-outer quadrant of right breast in female, estrogen receptor positive ([place])  C50.411    Z17.0     SUMMARY OF ONCOLOGIC HISTORY: Oncology History  Malignant neoplasm of upper-outer quadrant of right breast in female, estrogen receptor positive ([place])  1992 Miscellaneous   Left mastectomy followed by adjuvant chemotherapy   05/22/2019 Initial Diagnosis   Patient palpated a lump in the right breast. Mammogram showed 2.2cm mass in the UOQ, with no right axillary adenopathy. 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 The Common Hereditary Cancers Panel offered by [facility] includes sequencing and/or deletion duplication testing of the following 48 genes: APC, ATM, AXIN2, BARD1, BMPR1A, BRCA1, BRCA2, BRIP1, CDH1, CDKN2A (p14ARF), CDKN2A (p16INK4a), CKD4, CHEK2, CTNNA1, DICER1, EPCAM (Deletion/duplication testing only), GREM1 (promoter region deletion/duplication testing only), KIT, MEN1, MLH1, MSH2, MSH3, MSH6, MUTYH, NBN, NF1, NHTL1, PALB2, PDGFRA, PMS2, POLD1, POLE, PTEN, RAD50, RAD51C, RAD51D, RNF43,  SDHB, SDHC, SDHD, SMAD4, SMARCA4. STK11, TP53, TSC1, TSC2, and VHL.  The following genes were evaluated for sequence changes only: SDHA and HOXB13 c.251G>A variant only. The report date is 06/18/2019.   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   07/17/2019 Cancer Staging   Staging form: Breast, AJCC 8th Edition - Pathologic: Stage IIA (pT2, pN0, cM0, G3, ER+, PR-, HER2+) - Signed by [name], MD on 07/17/2019   08/10/2019 -  Chemotherapy   The patient had palonosetron (ALOXI) injection 0.25 mg, 0.25 mg, Intravenous,  Once, 4 of 6 cycles Administration: 0.25 mg (08/10/2019), 0.25 mg (08/31/2019), 0.25 mg (09/21/2019), 0.25 mg (10/12/2019) pegfilgrastim-jmdb (FULPHILA) injection 6 mg, 6 mg, Subcutaneous,  Once, 4 of 6 cycles Administration: 6 mg (08/12/2019), 6 mg (09/02/2019), 6 mg (09/23/2019), 6 mg (10/14/2019) CARBOplatin (PARAPLATIN) 600 mg in sodium chloride 0.9 % 250 mL chemo infusion, 600 mg (99.3 % of original dose 604.2 mg), Intravenous,  Once, 4 of 6 cycles Dose modification:   (original dose 604.2 mg, Cycle 1), 600 mg (original dose 604.2 mg, Cycle 1),   (original dose 604.2 mg, Cycle 2, Reason: Provider Judgment) Administration: 600 mg (08/10/2019), 500 mg (08/31/2019), 500 mg (09/21/2019), 500 mg (10/12/2019) DOCEtaxel (TAXOTERE) 140 mg in sodium chloride 0.9 % 250 mL chemo infusion, 65 mg/m2 = 140 mg (86.7 % of original dose 75 mg/m2), Intravenous,  Once, 4 of 6 cycles Dose modification: 65 mg/m2 (original dose 75 mg/m2, Cycle 1, Reason: Provider Judgment), 50 mg/m2 (original dose 75 mg/m2, Cycle 2, Reason: Provider Judgment), 60 mg/m2 (original dose 75 mg/m2, Cycle 2, Reason: Dose not tolerated) Administration: 140 mg (08/10/2019), 130 mg (  08/31/2019), 130 mg (09/21/2019), 130 mg (10/12/2019) pertuzumab (PERJETA) 420 mg in sodium chloride 0.9 % 250 mL chemo infusion, 420 mg  (50 % of original dose 840 mg), Intravenous, Once, 4 of 6 cycles Dose modification: 420 mg (original dose 840 mg, Cycle 1, Reason: Provider Judgment) Administration: 420 mg (08/10/2019), 420 mg (08/31/2019), 420 mg (09/21/2019), 420 mg (10/12/2019) fosaprepitant (EMEND) 150 mg, dexamethasone (DECADRON) 12 mg in sodium chloride 0.9 % 145 mL IVPB, , Intravenous,  Once, 4 of 6 cycles Administration:  (08/10/2019),  (08/31/2019),  (09/21/2019),  (10/12/2019) trastuzumab-dkst (OGIVRI) 750 mg in sodium chloride 0.9 % 250 mL chemo infusion, 714 mg, Intravenous,  Once, 4 of 6 cycles Administration: 750 mg (08/10/2019), 600 mg (08/31/2019), 600 mg (09/21/2019), 546 mg (10/12/2019)  for chemotherapy treatment.      CHIEF COMPLIANT: Cycle5TCH Perjeta  INTERVAL HISTORY: Marisa Spears is a 68 y.o. with above-mentioned history of HER-2 positive right breast cancerwho underwent a mastectomy. She is currently on adjuvant chemotherapy withTCH Perjeta. She presents to the clinic todayforcycle5.  She had 1 day of profound diarrhea which improved with Lomotil and Imodium.  Denies any nausea or vomiting.  She does continue to have fatigue.  REVIEW OF SYSTEMS:   Constitutional: Denies fevers, chills or abnormal weight loss, complains of fatigue Eyes: Denies blurriness of vision Ears, nose, mouth, throat, and face: Denies mucositis or sore throat Respiratory: Denies cough, dyspnea or wheezes Cardiovascular: Denies palpitation, chest discomfort Gastrointestinal: Diarrhea Skin: Denies abnormal skin rashes Lymphatics: Denies new lymphadenopathy or easy bruising Neurological: Denies numbness, tingling or new weaknesses Behavioral/Psych: Mood is stable, no new changes  Extremities: No lower extremity edema Breast: denies any pain or lumps or nodules in either breasts All other systems were reviewed with the patient and are negative.  I have reviewed the past medical history, past surgical history, social history  and family history with the patient and they are unchanged from previous note.  ALLERGIES:  is allergic to codeine.  MEDICATIONS:  Current Outpatient Medications  Medication Sig Dispense Refill   aspirin EC 81 MG tablet Take 81 mg by mouth at bedtime.      Biotin w/ Vitamins C & E (HAIR/SKIN/NAILS PO) Take 2 tablets by mouth daily.     Calcium Carb-Cholecalciferol (CALCIUM 600+D3 PO) Take 1 tablet by mouth daily.     Cholecalciferol (EQL VITAMIN D3) 50 MCG (2000 UT) CAPS Take 4,000 Units by mouth daily.     diphenoxylate-atropine (LOMOTIL) 2.5-0.025 MG tablet Take 1 tablet by mouth 2 (two) times daily. 60 tablet 3   lidocaine-prilocaine (EMLA) cream Apply to affected area once 30 g 3   LORazepam (ATIVAN) 0.5 MG tablet Take 1 tablet (0.5 mg total) by mouth at bedtime as needed for sleep. 30 tablet 2   methocarbamol (ROBAXIN) 500 MG tablet Take 1 tablet (500 mg total) by mouth every 6 (six) hours as needed for muscle spasms. 30 tablet 1   Omega-3 Fatty Acids (FISH OIL ULTRA) 1400 MG CAPS Take 1,400 mg by mouth 2 (two) times daily.     ondansetron (ZOFRAN) 8 MG tablet Take 1 tablet (8 mg total) by mouth 2 (two) times daily as needed (Nausea or vomiting). Begin 4 days after chemotherapy. 30 tablet 1   pravastatin (PRAVACHOL) 40 MG tablet Take 40 mg by mouth at bedtime.      prochlorperazine (COMPAZINE) 10 MG tablet Take 1 tablet (10 mg total) by mouth every 6 (six) hours as needed (Nausea or vomiting). 30 tablet 1  traMADol (ULTRAM) 50 MG tablet Take 1 tablet (50 mg total) by mouth every 6 (six) hours as needed (mild pain). 30 tablet 0   triamterene-hydrochlorothiazide (MAXZIDE-25) 37.5-25 MG tablet Take 1 tablet by mouth daily.      venlafaxine XR (EFFEXOR XR) 37.5 MG 24 hr capsule Take 1 capsule (37.5 mg total) by mouth daily with breakfast. 30 capsule 6   No current facility-administered medications for this visit.     PHYSICAL EXAMINATION: ECOG PERFORMANCE STATUS: 1 -  Symptomatic but completely ambulatory  Vitals:   11/02/19 1019  BP: (!) 137/95  Pulse: 76  Resp: 17  Temp: 98.7 F (37.1 C)  SpO2: 99%   Filed Weights   11/02/19 1019  Weight: 199 lb 6.4 oz (90.4 kg)    GENERAL: alert, no distress and comfortable SKIN: skin color, texture, turgor are normal, no rashes or significant lesions EYES: normal, Conjunctiva are pink and non-injected, sclera clear OROPHARYNX: no exudate, no erythema and lips, buccal mucosa, and tongue normal  NECK: supple, thyroid normal size, non-tender, without nodularity LYMPH: no palpable lymphadenopathy in the cervical, axillary or inguinal LUNGS: clear to auscultation and percussion with normal breathing effort HEART: regular rate & rhythm and no murmurs and no lower extremity edema ABDOMEN: abdomen soft, non-tender and normal bowel sounds MUSCULOSKELETAL: no cyanosis of digits and no clubbing  NEURO: alert & oriented x 3 with fluent speech, no focal motor/sensory deficits EXTREMITIES: No lower extremity edema  LABORATORY DATA:  I have reviewed the data as listed CMP Latest Ref Rng & Units 10/12/2019 09/21/2019 08/31/2019  Glucose 70 - 99 mg/dL 107(H) 152(H) 185(H)  BUN 8 - 23 mg/dL 8 7(L) 8  Creatinine 0.44 - 1.00 mg/dL 0.70 0.68 0.74  Sodium 135 - 145 mmol/L 140 139 141  Potassium 3.5 - 5.1 mmol/L 4.1 4.0 3.3(L)  Chloride 98 - 111 mmol/L 104 104 104  CO2 22 - 32 mmol/L _0 Calcium 8.9 - 10.3 mg/dL 9.1 9.3 9.0  Total Protein 6.5 - 8.1 g/dL 6.7 6.9 6.7  Total Bilirubin 0.3 - 1.2 mg/dL 0.6 0.7 0.6  Alkaline Phos 38 - 126 U/L 71 60 65  AST 15 - 41 U/L 13(L) 13(L) 14(L)  ALT 0 - 44 U/L _1 Lab Results  Component Value Date   WBC 5.8 11/02/2019   HGB 10.5 (L) 11/02/2019   HCT 30.9 (L) 11/02/2019   MCV 95.7 11/02/2019   PLT 268 11/02/2019   NEUTROABS 4.1 11/02/2019    ASSESSMENT & PLAN:  Malignant neoplasm of upper-outer quadrant of right breast in female, estrogen receptor positive  ([place]) 05/22/2019:Patient palpated a lump in the right breast. Mammogram showed 2.2cm mass in the UOQ, with no right axillary adenopathy. 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 Stage 2A 2.Adjuvant chemotherapy with [place] followed by Herceptin Perjeta maintenance (we also discussed neoadjuvant chemotherapy risks and benefits)started 08/10/2019 3.Adjuvant antiestrogen therapy with anastrozole 1 mg daily x7 years [id number]: Taxane neuropathy study: No adverse effects due to participation in the study -------------------------------------------------------------------------------------------------------------------------- Current treatment: Cycle5TCH Perjeta Echocardiogram: EF 55 to 60% Chemo toxicities: 1.Nausea and vomiting:We reducedthe dosage of cycle 2, much improved 2.Diarrhea: Markedly worse.  Due to Perjeta.  I gave her prescription for Lomotil today. 3.Fatigue due to chemotherapy    Monitoring closely for toxicities. Anxiety and depression: On Effexor Return to clinic in3 weeks for cycle6     No orders of the defined types were placed in this encounter.  The patient has a good understanding of the overall plan. she agrees with it. she will call with any problems that may develop before the next visit here.  [name], MD 11/02/2019  Julious Oka Dorshimer, am acting as scribe for Dr. [name].  I have reviewed the above documentation for accuracy and completeness, and I agree with the above.

## 2019-11-02 ENCOUNTER — Encounter: Payer: Self-pay | Admitting: *Deleted

## 2019-11-02 ENCOUNTER — Inpatient Hospital Stay: Payer: Medicare Other

## 2019-11-02 ENCOUNTER — Inpatient Hospital Stay (HOSPITAL_BASED_OUTPATIENT_CLINIC_OR_DEPARTMENT_OTHER): Payer: Medicare Other | Admitting: Hematology and Oncology

## 2019-11-02 ENCOUNTER — Inpatient Hospital Stay: Payer: Medicare Other | Attending: Hematology and Oncology

## 2019-11-02 ENCOUNTER — Other Ambulatory Visit: Payer: Self-pay

## 2019-11-02 VITALS — BP 144/85

## 2019-11-02 VITALS — BP 137/95 | HR 76 | Temp 98.7°F | Resp 17 | Ht 69.0 in | Wt 199.4 lb

## 2019-11-02 DIAGNOSIS — Z170 Estrogen receptor positive status [ER+]: Secondary | ICD-10-CM

## 2019-11-02 DIAGNOSIS — C50411 Malignant neoplasm of upper-outer quadrant of right female breast: Secondary | ICD-10-CM

## 2019-11-02 DIAGNOSIS — Z5111 Encounter for antineoplastic chemotherapy: Secondary | ICD-10-CM

## 2019-11-02 DIAGNOSIS — Z79899 Other long term (current) drug therapy: Secondary | ICD-10-CM | POA: Diagnosis not present

## 2019-11-02 DIAGNOSIS — Z95828 Presence of other vascular implants and grafts: Secondary | ICD-10-CM

## 2019-11-02 DIAGNOSIS — Z5112 Encounter for antineoplastic immunotherapy: Secondary | ICD-10-CM | POA: Insufficient documentation

## 2019-11-02 DIAGNOSIS — Z5189 Encounter for other specified aftercare: Secondary | ICD-10-CM | POA: Insufficient documentation

## 2019-11-02 LAB — CBC WITH DIFFERENTIAL (CANCER CENTER ONLY)
Abs Immature Granulocytes: 0.03 10*3/uL (ref 0.00–0.07)
Basophils Absolute: 0.1 10*3/uL (ref 0.0–0.1)
Basophils Relative: 1 %
Eosinophils Absolute: 0 10*3/uL (ref 0.0–0.5)
Eosinophils Relative: 0 %
HCT: 30.9 % — ABNORMAL LOW (ref 36.0–46.0)
Hemoglobin: 10.5 g/dL — ABNORMAL LOW (ref 12.0–15.0)
Immature Granulocytes: 1 %
Lymphocytes Relative: 18 %
Lymphs Abs: 1.1 10*3/uL (ref 0.7–4.0)
MCH: 32.5 pg (ref 26.0–34.0)
MCHC: 34 g/dL (ref 30.0–36.0)
MCV: 95.7 fL (ref 80.0–100.0)
Monocytes Absolute: 0.6 10*3/uL (ref 0.1–1.0)
Monocytes Relative: 10 %
Neutro Abs: 4.1 10*3/uL (ref 1.7–7.7)
Neutrophils Relative %: 70 %
Platelet Count: 268 10*3/uL (ref 150–400)
RBC: 3.23 MIL/uL — ABNORMAL LOW (ref 3.87–5.11)
RDW: 16.3 % — ABNORMAL HIGH (ref 11.5–15.5)
WBC Count: 5.8 10*3/uL (ref 4.0–10.5)
nRBC: 0 % (ref 0.0–0.2)

## 2019-11-02 LAB — CMP (CANCER CENTER ONLY)
ALT: 9 U/L (ref 0–44)
AST: 15 U/L (ref 15–41)
Albumin: 3.8 g/dL (ref 3.5–5.0)
Alkaline Phosphatase: 65 U/L (ref 38–126)
Anion gap: 8 (ref 5–15)
BUN: 8 mg/dL (ref 8–23)
CO2: 27 mmol/L (ref 22–32)
Calcium: 8.3 mg/dL — ABNORMAL LOW (ref 8.9–10.3)
Chloride: 106 mmol/L (ref 98–111)
Creatinine: 0.7 mg/dL (ref 0.44–1.00)
GFR, Est AFR Am: >60 mL/min (ref >60)
GFR, Estimated: >60 mL/min (ref >60)
Glucose, Bld: 111 mg/dL — ABNORMAL HIGH (ref 70–99)
Potassium: 3.7 mmol/L (ref 3.5–5.1)
Sodium: 141 mmol/L (ref 135–145)
Total Bilirubin: 0.6 mg/dL (ref 0.3–1.2)
Total Protein: 6.8 g/dL (ref 6.5–8.1)

## 2019-11-02 MED ORDER — PALONOSETRON HCL INJECTION 0.25 MG/5ML
0.2500 mg | Freq: Once | INTRAVENOUS | Status: AC
  Administered 2019-11-02: 12:00:00 0.25 mg via INTRAVENOUS

## 2019-11-02 MED ORDER — SODIUM CHLORIDE 0.9 % IV SOLN
60.0000 mg/m2 | Freq: Once | INTRAVENOUS | Status: AC
  Administered 2019-11-02: 15:00:00 130 mg via INTRAVENOUS
  Filled 2019-11-02: qty 13

## 2019-11-02 MED ORDER — ACETAMINOPHEN 325 MG PO TABS
650.0000 mg | ORAL_TABLET | Freq: Once | ORAL | Status: AC
  Administered 2019-11-02: 650 mg via ORAL

## 2019-11-02 MED ORDER — SODIUM CHLORIDE 0.9% FLUSH
10.0000 mL | INTRAVENOUS | Status: DC | PRN
  Administered 2019-11-02: 10 mL
  Filled 2019-11-02: qty 10

## 2019-11-02 MED ORDER — SODIUM CHLORIDE 0.9 % IV SOLN
498.0000 mg | Freq: Once | INTRAVENOUS | Status: AC
  Administered 2019-11-02: 500 mg via INTRAVENOUS
  Filled 2019-11-02: qty 50

## 2019-11-02 MED ORDER — PALONOSETRON HCL INJECTION 0.25 MG/5ML
INTRAVENOUS | Status: AC
  Filled 2019-11-02: qty 5

## 2019-11-02 MED ORDER — DIPHENHYDRAMINE HCL 25 MG PO CAPS
50.0000 mg | ORAL_CAPSULE | Freq: Once | ORAL | Status: AC
  Administered 2019-11-02: 50 mg via ORAL

## 2019-11-02 MED ORDER — ACETAMINOPHEN 325 MG PO TABS
ORAL_TABLET | ORAL | Status: AC
  Filled 2019-11-02: qty 2

## 2019-11-02 MED ORDER — SODIUM CHLORIDE 0.9 % IV SOLN
Freq: Once | INTRAVENOUS | Status: AC
  Administered 2019-11-02: 12:00:00 via INTRAVENOUS
  Filled 2019-11-02: qty 250

## 2019-11-02 MED ORDER — TRASTUZUMAB-DKST CHEMO 150 MG IV SOLR
6.0000 mg/kg | Freq: Once | INTRAVENOUS | Status: AC
  Administered 2019-11-02: 13:00:00 546 mg via INTRAVENOUS
  Filled 2019-11-02: qty 26

## 2019-11-02 MED ORDER — SODIUM CHLORIDE 0.9 % IV SOLN
420.0000 mg | Freq: Once | INTRAVENOUS | Status: AC
  Administered 2019-11-02: 13:00:00 420 mg via INTRAVENOUS
  Filled 2019-11-02: qty 14

## 2019-11-02 MED ORDER — HEPARIN SOD (PORK) LOCK FLUSH 100 UNIT/ML IV SOLN
500.0000 [IU] | Freq: Once | INTRAVENOUS | Status: AC | PRN
  Administered 2019-11-02: 16:00:00 500 [IU]
  Filled 2019-11-02: qty 5

## 2019-11-02 MED ORDER — SODIUM CHLORIDE 0.9 % IV SOLN
Freq: Once | INTRAVENOUS | Status: AC
  Administered 2019-11-02: 12:00:00 via INTRAVENOUS
  Filled 2019-11-02: qty 5

## 2019-11-02 MED ORDER — DIPHENHYDRAMINE HCL 25 MG PO CAPS
ORAL_CAPSULE | ORAL | Status: AC
  Filled 2019-11-02: qty 2

## 2019-11-02 NOTE — Assessment & Plan Note (Signed)
05/22/2019:Patient palpated a lump in the right breast. Mammogram showed 2.2cm mass in the UOQ, with no right axillary adenopathy. 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 Stage 2A 2.Adjuvant chemotherapy with [place] followed by Herceptin Perjeta maintenance (we also discussed neoadjuvant chemotherapy risks and benefits)started 08/10/2019 3.Adjuvant antiestrogen therapy with anastrozole 1 mg daily x7 years [id number]: Taxane neuropathy study: No adverse effects due to participation in the study -------------------------------------------------------------------------------------------------------------------------- Current treatment: Cycle5TCH Perjeta Echocardiogram: EF 55 to 60% Chemo toxicities: 1.Nausea and vomiting:We reducedthe dosage of cycle 2, much improved 2.Diarrhea: Markedly worse.  Due to Perjeta.  I gave her prescription for Lomotil today. 3.Fatigue due to chemotherapy    Monitoring closely for toxicities. Anxiety and depression: On Effexor Return to clinic in3 weeks for cycle6

## 2019-11-02 NOTE — Patient Instructions (Signed)
[facility] [facility] Discharge Instructions for Patients Receiving Chemotherapy  Today you received the following chemotherapy agents:  Trastuzumab, pertuzumab, docetaxel, carboplatin  To help prevent nausea and vomiting after your treatment, we encourage you to take your nausea medication as prescribed.   If you develop nausea and vomiting that is not controlled by your nausea medication, call the clinic.   BELOW ARE SYMPTOMS THAT SHOULD BE REPORTED IMMEDIATELY:  *FEVER GREATER THAN 100.5 F  *CHILLS WITH OR WITHOUT FEVER  NAUSEA AND VOMITING THAT IS NOT CONTROLLED WITH YOUR NAUSEA MEDICATION  *UNUSUAL SHORTNESS OF BREATH  *UNUSUAL BRUISING OR BLEEDING  TENDERNESS IN MOUTH AND THROAT WITH OR WITHOUT PRESENCE OF ULCERS  *URINARY PROBLEMS  *BOWEL PROBLEMS  UNUSUAL RASH Items with * indicate a potential emergency and should be followed up as soon as possible.  Feel free to call the clinic should you have any questions or concerns. The clinic phone number is [phone].  Please show the [place] at check-in to the Emergency Department and triage nurse.

## 2019-11-03 ENCOUNTER — Telehealth: Payer: Self-pay | Admitting: Hematology and Oncology

## 2019-11-03 NOTE — Telephone Encounter (Signed)
I talk with patient regarding schedule  

## 2019-11-03 NOTE — Telephone Encounter (Signed)
Printed and mailed medical records from 10/20 thru present to patient.

## 2019-11-04 ENCOUNTER — Other Ambulatory Visit: Payer: Self-pay

## 2019-11-04 ENCOUNTER — Inpatient Hospital Stay: Payer: Medicare Other

## 2019-11-04 VITALS — BP 119/77 | HR 69 | Temp 97.9°F | Resp 17

## 2019-11-04 DIAGNOSIS — Z170 Estrogen receptor positive status [ER+]: Secondary | ICD-10-CM

## 2019-11-04 DIAGNOSIS — Z5112 Encounter for antineoplastic immunotherapy: Secondary | ICD-10-CM | POA: Diagnosis not present

## 2019-11-04 MED ORDER — PEGFILGRASTIM-JMDB 6 MG/0.6ML ~~LOC~~ SOSY
6.0000 mg | PREFILLED_SYRINGE | Freq: Once | SUBCUTANEOUS | Status: AC
  Administered 2019-11-04: 15:00:00 6 mg via SUBCUTANEOUS

## 2019-11-04 MED ORDER — PEGFILGRASTIM-JMDB 6 MG/0.6ML ~~LOC~~ SOSY
PREFILLED_SYRINGE | SUBCUTANEOUS | Status: AC
  Filled 2019-11-04: qty 0.6

## 2019-11-04 NOTE — Patient Instructions (Signed)

## 2019-11-22 NOTE — Progress Notes (Signed)
Patient Care Team: Earney Mallet, MD as PCP - General (Family Medicine) Marisa Kaufmann, RN as Oncology Nurse Navigator Rockwell Germany, RN as Oncology Nurse Navigator Marisa Lose, MD as Consulting Physician (Hematology and Oncology) Marisa Kussmaul, MD as Consulting Physician (General Surgery) Marisa Gibson, MD as Attending Physician (Radiation Oncology)  DIAGNOSIS:    ICD-10-CM   1. Malignant neoplasm of upper-outer quadrant of right breast in female, estrogen receptor positive ([place])  C50.411    Z17.0     SUMMARY OF ONCOLOGIC HISTORY: Oncology History  Malignant neoplasm of upper-outer quadrant of right breast in female, estrogen receptor positive ([place])  1992 Miscellaneous   Left mastectomy followed by adjuvant chemotherapy   05/22/2019 Initial Diagnosis   Patient palpated a lump in the right breast. Mammogram showed 2.2cm mass in the UOQ, with no right axillary adenopathy. 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 The Common Hereditary Cancers Panel offered by [facility] includes sequencing and/or deletion duplication testing of the following 48 genes: APC, ATM, AXIN2, BARD1, BMPR1A, BRCA1, BRCA2, BRIP1, CDH1, CDKN2A (p14ARF), CDKN2A (p16INK4a), CKD4, CHEK2, CTNNA1, DICER1, EPCAM (Deletion/duplication testing only), GREM1 (promoter region deletion/duplication testing only), KIT, MEN1, MLH1, MSH2, MSH3, MSH6, MUTYH, NBN, NF1, NHTL1, PALB2, PDGFRA, PMS2, POLD1, POLE, PTEN, RAD50, RAD51C, RAD51D, RNF43, SDHB, SDHC, SDHD, SMAD4, SMARCA4. STK11, TP53, TSC1,  TSC2, and VHL.  The following genes were evaluated for sequence changes only: SDHA and HOXB13 c.251G>A variant only. The report date is 06/18/2019.   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   07/17/2019 Cancer Staging   Staging form: Breast, AJCC 8th Edition - Pathologic: Stage IIA (pT2, pN0, cM0, G3, ER+, PR-, HER2+) - Signed by Marisa Lose, MD on 07/17/2019   08/10/2019 -  Chemotherapy   The patient had palonosetron (ALOXI) injection 0.25 mg, 0.25 mg, Intravenous,  Once, 5 of 6 cycles Administration: 0.25 mg (08/10/2019), 0.25 mg (08/31/2019), 0.25 mg (11/02/2019), 0.25 mg (09/21/2019), 0.25 mg (10/12/2019) pegfilgrastim-jmdb (FULPHILA) injection 6 mg, 6 mg, Subcutaneous,  Once, 5 of 6 cycles Administration: 6 mg (08/12/2019), 6 mg (09/02/2019), 6 mg (11/04/2019), 6 mg (09/23/2019), 6 mg (10/14/2019) CARBOplatin (PARAPLATIN) 600 mg in sodium chloride 0.9 % 250 mL chemo infusion, 600 mg (99.3 % of original dose 604.2 mg), Intravenous,  Once, 5 of 6 cycles Dose modification:   (original dose 604.2 mg, Cycle 1), 600 mg (original dose 604.2 mg, Cycle 1),   (original dose 604.2 mg, Cycle 2, Reason: Provider Judgment) Administration: 600 mg (08/10/2019), 500 mg (08/31/2019), 500 mg (11/02/2019), 500 mg (09/21/2019), 500 mg (10/12/2019) DOCEtaxel (TAXOTERE) 140 mg in sodium chloride 0.9 % 250 mL chemo infusion, 65 mg/m2 = 140 mg (86.7 % of original dose 75 mg/m2), Intravenous,  Once, 5 of 6 cycles Dose modification: 65 mg/m2 (original dose 75 mg/m2, Cycle 1, Reason: Provider Judgment), 50 mg/m2 (original dose 75 mg/m2, Cycle 2, Reason: Provider Judgment), 60 mg/m2 (original dose 75 mg/m2, Cycle 2, Reason: Dose not tolerated) Administration: 140 mg (08/10/2019), 130 mg (  08/31/2019), 130 mg (11/02/2019), 130 mg (09/21/2019), 130 mg (10/12/2019) pertuzumab (PERJETA) 420 mg in sodium chloride 0.9 % 250  mL chemo infusion, 420 mg (50 % of original dose 840 mg), Intravenous, Once, 5 of 6 cycles Dose modification: 420 mg (original dose 840 mg, Cycle 1, Reason: Provider Judgment) Administration: 420 mg (08/10/2019), 420 mg (08/31/2019), 420 mg (11/02/2019), 420 mg (09/21/2019), 420 mg (10/12/2019) fosaprepitant (EMEND) 150 mg, dexamethasone (DECADRON) 12 mg in sodium chloride 0.9 % 145 mL IVPB, , Intravenous,  Once, 5 of 6 cycles Administration:  (08/10/2019),  (08/31/2019),  (11/02/2019),  (09/21/2019),  (10/12/2019) trastuzumab-dkst (OGIVRI) 750 mg in sodium chloride 0.9 % 250 mL chemo infusion, 714 mg, Intravenous,  Once, 5 of 6 cycles Administration: 750 mg (08/10/2019), 600 mg (08/31/2019), 546 mg (11/02/2019), 600 mg (09/21/2019), 546 mg (10/12/2019)  for chemotherapy treatment.      CHIEF COMPLIANT: Cycle6TCH Perjeta  INTERVAL HISTORY: Marisa Spears is a 68 y.o. with above-mentioned history of HER-2 positive right breast cancerwho underwent a mastectomy. She is currently on adjuvant chemotherapy withTCH Perjeta. She presents to the clinic todayforcycle6.  REVIEW OF SYSTEMS:   Constitutional: Denies fevers, chills or abnormal weight loss Eyes: Denies blurriness of vision Ears, nose, mouth, throat, and face: Denies mucositis or sore throat Respiratory: Denies cough, dyspnea or wheezes Cardiovascular: Denies palpitation, chest discomfort Gastrointestinal: Denies nausea, heartburn or change in bowel habits Skin: Denies abnormal skin rashes Lymphatics: Denies new lymphadenopathy or easy bruising Neurological: Denies numbness, tingling or new weaknesses Behavioral/Psych: Mood is stable, no new changes  Extremities: No lower extremity edema Breast: denies any pain or lumps or nodules in either breasts All other systems were reviewed with the patient and are negative.  I have reviewed the past medical history, past surgical history, social history and family history with the patient and they  are unchanged from previous note.  ALLERGIES:  is allergic to codeine.  MEDICATIONS:  Current Outpatient Medications  Medication Sig Dispense Refill  . aspirin EC 81 MG tablet Take 81 mg by mouth at bedtime.     . Biotin w/ Vitamins C & E (HAIR/SKIN/NAILS PO) Take 2 tablets by mouth daily.    . Calcium Carb-Cholecalciferol (CALCIUM 600+D3 PO) Take 1 tablet by mouth daily.    . Cholecalciferol (EQL VITAMIN D3) 50 MCG (2000 UT) CAPS Take 4,000 Units by mouth daily.    . diphenoxylate-atropine (LOMOTIL) 2.5-0.025 MG tablet Take 1 tablet by mouth 2 (two) times daily. 60 tablet 3  . lidocaine-prilocaine (EMLA) cream Apply to affected area once 30 g 3  . LORazepam (ATIVAN) 0.5 MG tablet Take 1 tablet (0.5 mg total) by mouth at bedtime as needed for sleep. 30 tablet 2  . methocarbamol (ROBAXIN) 500 MG tablet Take 1 tablet (500 mg total) by mouth every 6 (six) hours as needed for muscle spasms. 30 tablet 1  . Omega-3 Fatty Acids (FISH OIL ULTRA) 1400 MG CAPS Take 1,400 mg by mouth 2 (two) times daily.    . ondansetron (ZOFRAN) 8 MG tablet Take 1 tablet (8 mg total) by mouth 2 (two) times daily as needed (Nausea or vomiting). Begin 4 days after chemotherapy. 30 tablet 1  . pravastatin (PRAVACHOL) 40 MG tablet Take 40 mg by mouth at bedtime.     . prochlorperazine (COMPAZINE) 10 MG tablet Take 1 tablet (10 mg total) by mouth every 6 (six) hours as needed (Nausea or vomiting). 30 tablet 1  . traMADol (ULTRAM) 50 MG tablet Take 1 tablet (50 mg  total) by mouth every 6 (six) hours as needed (mild pain). 30 tablet 0  . triamterene-hydrochlorothiazide (MAXZIDE-25) 37.5-25 MG tablet Take 1 tablet by mouth daily.     Marland Kitchen venlafaxine XR (EFFEXOR XR) 37.5 MG 24 hr capsule Take 1 capsule (37.5 mg total) by mouth daily with breakfast. 30 capsule 6   No current facility-administered medications for this visit.    PHYSICAL EXAMINATION: ECOG PERFORMANCE STATUS: 1 - Symptomatic but completely ambulatory  Vitals:    11/23/19 1022  BP: 133/82  Pulse: 78  Resp: 18  Temp: 98.3 F (36.8 C)  SpO2: 98%   Filed Weights   11/23/19 1022  Weight: 199 lb 14.4 oz (90.7 kg)    GENERAL: alert, no distress and comfortable SKIN: skin color, texture, turgor are normal, no rashes or significant lesions EYES: normal, Conjunctiva are pink and non-injected, sclera clear OROPHARYNX: no exudate, no erythema and lips, buccal mucosa, and tongue normal  NECK: supple, thyroid normal size, non-tender, without nodularity LYMPH: no palpable lymphadenopathy in the cervical, axillary or inguinal LUNGS: clear to auscultation and percussion with normal breathing effort HEART: regular rate & rhythm and no murmurs and no lower extremity edema ABDOMEN: abdomen soft, non-tender and normal bowel sounds MUSCULOSKELETAL: no cyanosis of digits and no clubbing  NEURO: alert & oriented x 3 with fluent speech, no focal motor/sensory deficits EXTREMITIES: No lower extremity edema  LABORATORY DATA:  I have reviewed the data as listed CMP Latest Ref Rng & Units 11/23/2019 11/02/2019 10/12/2019  Glucose 70 - 99 mg/dL 139(H) 111(H) 107(H)  BUN 8 - 23 mg/dL '8 8 8  ' Creatinine 0.44 - 1.00 mg/dL 0.72 0.70 0.70  Sodium 135 - 145 mmol/L 140 141 140  Potassium 3.5 - 5.1 mmol/L 3.4(L) 3.7 4.1  Chloride 98 - 111 mmol/L 103 106 104  CO2 22 - 32 mmol/L '25 27 26  ' Calcium 8.9 - 10.3 mg/dL 8.5(L) 8.3(L) 9.1  Total Protein 6.5 - 8.1 g/dL 6.5 6.8 6.7  Total Bilirubin 0.3 - 1.2 mg/dL 0.5 0.6 0.6  Alkaline Phos 38 - 126 U/L 64 65 71  AST 15 - 41 U/L 16 15 13(L)  ALT 0 - 44 U/L '9 9 10    ' Lab Results  Component Value Date   WBC 4.5 11/23/2019   HGB 10.0 (L) 11/23/2019   HCT 29.2 (L) 11/23/2019   MCV 98.0 11/23/2019   PLT 209 11/23/2019   NEUTROABS 3.0 11/23/2019    ASSESSMENT & PLAN:  Malignant neoplasm of upper-outer quadrant of right breast in female, estrogen receptor positive ([place]) 05/22/2019:Patient palpated a lump in the right breast.  Mammogram showed 2.2cm mass in the UOQ, with no right axillary adenopathy. 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 Stage 2A 2.Adjuvant chemotherapy with [place] followed by Herceptin Perjeta maintenance (we also discussed neoadjuvant chemotherapy risks and benefits)started 08/10/2019 3.Adjuvant antiestrogen therapy with anastrozole 1 mg daily x7 years [id number]: Taxane neuropathy study: No adverse effects due to participation in the study -------------------------------------------------------------------------------------------------------------------------- Current treatment: Cycle6TCH Perjeta Echocardiogram: EF 55 to 60% Chemo toxicities: 1.Nausea and vomiting:We reducedthe dosage of cycle 2, much improved 2.Diarrhea:Markedly worse. Due to Perjeta. I gave her prescription for Lomotil today. 3.Fatigue due to chemotherapy  Monitoring closely for  toxicities. Anxiety and depression: On Effexor  Anastrozole counseling:We discussed the risks and benefits of anti-estrogen therapy with aromatase inhibitors. These include but not limited to insomnia, hot flashes, mood changes, vaginal dryness, bone density loss, and weight gain. We strongly believe that the benefits far outweigh the risks. Patient understands these risks and consented to starting treatment. Planned treatment duration is 7 years.  Return to clinic in3 weeks for Herceptin Perjeta maintenance.  We will also add antiestrogen therapy with anastrozole starting next month.    No orders of the defined types were placed in this encounter.  The patient has a good  understanding of the overall plan. she agrees with it. she will call with any problems that may develop before the next visit here.  Marisa Lose, MD 11/23/2019  Julious Oka Dorshimer, am acting as scribe for Dr. Nicholas Spears.  I have reviewed the above document for accuracy and completeness, and I agree with the above.

## 2019-11-23 ENCOUNTER — Inpatient Hospital Stay: Payer: Medicare Other

## 2019-11-23 ENCOUNTER — Inpatient Hospital Stay (HOSPITAL_BASED_OUTPATIENT_CLINIC_OR_DEPARTMENT_OTHER): Payer: Medicare Other | Admitting: Hematology and Oncology

## 2019-11-23 ENCOUNTER — Other Ambulatory Visit: Payer: Self-pay

## 2019-11-23 ENCOUNTER — Telehealth: Payer: Self-pay | Admitting: *Deleted

## 2019-11-23 DIAGNOSIS — Z5112 Encounter for antineoplastic immunotherapy: Secondary | ICD-10-CM | POA: Diagnosis not present

## 2019-11-23 DIAGNOSIS — Z95828 Presence of other vascular implants and grafts: Secondary | ICD-10-CM

## 2019-11-23 DIAGNOSIS — C50411 Malignant neoplasm of upper-outer quadrant of right female breast: Secondary | ICD-10-CM

## 2019-11-23 DIAGNOSIS — Z170 Estrogen receptor positive status [ER+]: Secondary | ICD-10-CM

## 2019-11-23 LAB — CBC WITH DIFFERENTIAL (CANCER CENTER ONLY)
Abs Immature Granulocytes: 0.02 10*3/uL (ref 0.00–0.07)
Basophils Absolute: 0.1 10*3/uL (ref 0.0–0.1)
Basophils Relative: 1 %
Eosinophils Absolute: 0 10*3/uL (ref 0.0–0.5)
Eosinophils Relative: 0 %
HCT: 29.2 % — ABNORMAL LOW (ref 36.0–46.0)
Hemoglobin: 10 g/dL — ABNORMAL LOW (ref 12.0–15.0)
Immature Granulocytes: 0 %
Lymphocytes Relative: 22 %
Lymphs Abs: 1 10*3/uL (ref 0.7–4.0)
MCH: 33.6 pg (ref 26.0–34.0)
MCHC: 34.2 g/dL (ref 30.0–36.0)
MCV: 98 fL (ref 80.0–100.0)
Monocytes Absolute: 0.4 10*3/uL (ref 0.1–1.0)
Monocytes Relative: 9 %
Neutro Abs: 3 10*3/uL (ref 1.7–7.7)
Neutrophils Relative %: 68 %
Platelet Count: 209 10*3/uL (ref 150–400)
RBC: 2.98 MIL/uL — ABNORMAL LOW (ref 3.87–5.11)
RDW: 14.9 % (ref 11.5–15.5)
WBC Count: 4.5 10*3/uL (ref 4.0–10.5)
nRBC: 0 % (ref 0.0–0.2)

## 2019-11-23 LAB — CMP (CANCER CENTER ONLY)
ALT: 9 U/L (ref 0–44)
AST: 16 U/L (ref 15–41)
Albumin: 3.7 g/dL (ref 3.5–5.0)
Alkaline Phosphatase: 64 U/L (ref 38–126)
Anion gap: 12 (ref 5–15)
BUN: 8 mg/dL (ref 8–23)
CO2: 25 mmol/L (ref 22–32)
Calcium: 8.5 mg/dL — ABNORMAL LOW (ref 8.9–10.3)
Chloride: 103 mmol/L (ref 98–111)
Creatinine: 0.72 mg/dL (ref 0.44–1.00)
GFR, Est AFR Am: >60 mL/min (ref >60)
GFR, Estimated: >60 mL/min (ref >60)
Glucose, Bld: 139 mg/dL — ABNORMAL HIGH (ref 70–99)
Potassium: 3.4 mmol/L — ABNORMAL LOW (ref 3.5–5.1)
Sodium: 140 mmol/L (ref 135–145)
Total Bilirubin: 0.5 mg/dL (ref 0.3–1.2)
Total Protein: 6.5 g/dL (ref 6.5–8.1)

## 2019-11-23 MED ORDER — SODIUM CHLORIDE 0.9 % IV SOLN
420.0000 mg | Freq: Once | INTRAVENOUS | Status: AC
  Administered 2019-11-23: 420 mg via INTRAVENOUS
  Filled 2019-11-23: qty 14

## 2019-11-23 MED ORDER — HEPARIN SOD (PORK) LOCK FLUSH 100 UNIT/ML IV SOLN
500.0000 [IU] | Freq: Once | INTRAVENOUS | Status: AC | PRN
  Administered 2019-11-23: 500 [IU]
  Filled 2019-11-23: qty 5

## 2019-11-23 MED ORDER — ACETAMINOPHEN 325 MG PO TABS
ORAL_TABLET | ORAL | Status: AC
  Filled 2019-11-23: qty 2

## 2019-11-23 MED ORDER — SODIUM CHLORIDE 0.9 % IV SOLN
60.0000 mg/m2 | Freq: Once | INTRAVENOUS | Status: AC
  Administered 2019-11-23: 130 mg via INTRAVENOUS
  Filled 2019-11-23: qty 13

## 2019-11-23 MED ORDER — SODIUM CHLORIDE 0.9 % IV SOLN
Freq: Once | INTRAVENOUS | Status: AC
  Filled 2019-11-23: qty 250

## 2019-11-23 MED ORDER — SODIUM CHLORIDE 0.9 % IV SOLN
Freq: Once | INTRAVENOUS | Status: AC
  Filled 2019-11-23: qty 5

## 2019-11-23 MED ORDER — SODIUM CHLORIDE 0.9 % IV SOLN
498.0000 mg | Freq: Once | INTRAVENOUS | Status: AC
  Administered 2019-11-23: 500 mg via INTRAVENOUS
  Filled 2019-11-23: qty 50

## 2019-11-23 MED ORDER — DIPHENHYDRAMINE HCL 25 MG PO CAPS
50.0000 mg | ORAL_CAPSULE | Freq: Once | ORAL | Status: AC
  Administered 2019-11-23: 50 mg via ORAL

## 2019-11-23 MED ORDER — SODIUM CHLORIDE 0.9% FLUSH
10.0000 mL | INTRAVENOUS | Status: DC | PRN
  Administered 2019-11-23: 10 mL
  Filled 2019-11-23: qty 10

## 2019-11-23 MED ORDER — DIPHENHYDRAMINE HCL 25 MG PO CAPS
ORAL_CAPSULE | ORAL | Status: AC
  Filled 2019-11-23: qty 2

## 2019-11-23 MED ORDER — TRASTUZUMAB-DKST CHEMO 150 MG IV SOLR
6.0000 mg/kg | Freq: Once | INTRAVENOUS | Status: AC
  Administered 2019-11-23: 546 mg via INTRAVENOUS
  Filled 2019-11-23: qty 26

## 2019-11-23 MED ORDER — ACETAMINOPHEN 325 MG PO TABS
650.0000 mg | ORAL_TABLET | Freq: Once | ORAL | Status: AC
  Administered 2019-11-23: 650 mg via ORAL

## 2019-11-23 MED ORDER — PALONOSETRON HCL INJECTION 0.25 MG/5ML
0.2500 mg | Freq: Once | INTRAVENOUS | Status: AC
  Administered 2019-11-23: 0.25 mg via INTRAVENOUS

## 2019-11-23 MED ORDER — ANASTROZOLE 1 MG PO TABS: 1.0000 mg | ORAL_TABLET | Freq: Every day | ORAL | 3 refills | Status: DC

## 2019-11-23 MED ORDER — LORAZEPAM 0.5 MG PO TABS: 0.5000 mg | ORAL_TABLET | Freq: Every evening | ORAL | 2 refills | Status: DC | PRN

## 2019-11-23 MED ORDER — PALONOSETRON HCL INJECTION 0.25 MG/5ML
INTRAVENOUS | Status: AC
  Filled 2019-11-23: qty 5

## 2019-11-23 NOTE — Assessment & Plan Note (Signed)
05/22/2019:Patient palpated a lump in the right breast. Mammogram showed 2.2cm mass in the UOQ, with no right axillary adenopathy. 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 Stage 2A 2.Adjuvant chemotherapy with [place] followed by Herceptin Perjeta maintenance (we also discussed neoadjuvant chemotherapy risks and benefits)started 08/10/2019 3.Adjuvant antiestrogen therapy with anastrozole 1 mg daily x7 years [id number]: Taxane neuropathy study: No adverse effects due to participation in the study -------------------------------------------------------------------------------------------------------------------------- Current treatment: Cycle5TCH Perjeta Echocardiogram: EF 55 to 60% Chemo toxicities: 1.Nausea and vomiting:We reducedthe dosage of cycle 2, much improved 2.Diarrhea:Markedly worse. Due to Perjeta. I gave her prescription for Lomotil today. 3.Fatigue due to chemotherapy  Monitoring closely for toxicities. Anxiety and depression: On Effexor  Anastrozole counseling:We discussed the risks and benefits of anti-estrogen therapy with aromatase inhibitors. These include but not limited to insomnia, hot flashes, mood changes, vaginal dryness, bone density loss, and weight gain. We strongly believe that the benefits far outweigh the risks. Patient understands these risks and consented to starting treatment. Planned treatment duration is 7 years.  Return to clinic in3 weeks for Herceptin Perjeta maintenance.  We will also add antiestrogen therapy with anastrozole starting next month.

## 2019-11-23 NOTE — Patient Instructions (Signed)
[facility] Discharge Instructions for Patients Receiving Chemotherapy  Today you received the following chemotherapy agents:  Trastuzumab, pertuzumab, docetaxel, carboplatin  To help prevent nausea and vomiting after your treatment, we encourage you to take your nausea medication as prescribed.   If you develop nausea and vomiting that is not controlled by your nausea medication, call the clinic.   BELOW ARE SYMPTOMS THAT SHOULD BE REPORTED IMMEDIATELY:  *FEVER GREATER THAN 100.5 F  *CHILLS WITH OR WITHOUT FEVER  NAUSEA AND VOMITING THAT IS NOT CONTROLLED WITH YOUR NAUSEA MEDICATION  *UNUSUAL SHORTNESS OF BREATH  *UNUSUAL BRUISING OR BLEEDING  TENDERNESS IN MOUTH AND THROAT WITH OR WITHOUT PRESENCE OF ULCERS  *URINARY PROBLEMS  *BOWEL PROBLEMS  UNUSUAL RASH Items with * indicate a potential emergency and should be followed up as soon as possible.  Feel free to call the clinic should you have any questions or concerns. The clinic phone number is [phone].  Please show the [place] at check-in to the Emergency Department and triage nurse.

## 2019-11-23 NOTE — Telephone Encounter (Signed)
Called pt to congratulate on final chemo today. Denies questions or needs at this time. Encourage pt to call with concerns. Received verbal understanding.

## 2019-11-25 ENCOUNTER — Inpatient Hospital Stay: Payer: Medicare Other

## 2019-11-25 ENCOUNTER — Other Ambulatory Visit: Payer: Self-pay

## 2019-11-25 ENCOUNTER — Other Ambulatory Visit: Payer: Self-pay | Admitting: Hematology and Oncology

## 2019-11-25 VITALS — BP 154/87 | HR 67 | Temp 98.7°F | Resp 18

## 2019-11-25 DIAGNOSIS — C50411 Malignant neoplasm of upper-outer quadrant of right female breast: Secondary | ICD-10-CM

## 2019-11-25 DIAGNOSIS — Z5112 Encounter for antineoplastic immunotherapy: Secondary | ICD-10-CM | POA: Diagnosis not present

## 2019-11-25 MED ORDER — PEGFILGRASTIM-JMDB 6 MG/0.6ML ~~LOC~~ SOSY
6.0000 mg | PREFILLED_SYRINGE | Freq: Once | SUBCUTANEOUS | Status: AC
  Administered 2019-11-25: 6 mg via SUBCUTANEOUS

## 2019-11-25 MED ORDER — PEGFILGRASTIM-JMDB 6 MG/0.6ML ~~LOC~~ SOSY
PREFILLED_SYRINGE | SUBCUTANEOUS | Status: AC
  Filled 2019-11-25: qty 0.6

## 2019-11-25 NOTE — Patient Instructions (Signed)

## 2019-11-26 NOTE — Progress Notes (Signed)
Received patient request for UB04 form along with cost of treatments.  RN forward request Lytzy Cozier in billing department.

## 2019-12-01 ENCOUNTER — Other Ambulatory Visit (HOSPITAL_COMMUNITY): Payer: Medicare Other

## 2019-12-02 ENCOUNTER — Ambulatory Visit (HOSPITAL_COMMUNITY)
Admission: RE | Admit: 2019-12-02 | Discharge: 2019-12-02 | Disposition: A | Discharge status: Home / Self Care | Payer: Medicare Other | Source: Ambulatory Visit | Attending: Hematology and Oncology | Admitting: Hematology and Oncology

## 2019-12-02 ENCOUNTER — Other Ambulatory Visit: Payer: Self-pay

## 2019-12-02 DIAGNOSIS — C50411 Malignant neoplasm of upper-outer quadrant of right female breast: Secondary | ICD-10-CM | POA: Diagnosis not present

## 2019-12-02 DIAGNOSIS — I119 Hypertensive heart disease without heart failure: Secondary | ICD-10-CM | POA: Diagnosis not present

## 2019-12-02 DIAGNOSIS — Z01810 Encounter for preprocedural cardiovascular examination: Secondary | ICD-10-CM | POA: Insufficient documentation

## 2019-12-02 DIAGNOSIS — I358 Other nonrheumatic aortic valve disorders: Secondary | ICD-10-CM | POA: Insufficient documentation

## 2019-12-02 DIAGNOSIS — Z170 Estrogen receptor positive status [ER+]: Secondary | ICD-10-CM | POA: Insufficient documentation

## 2019-12-13 NOTE — Progress Notes (Signed)
Patient Care Team: Earney Mallet, MD as PCP - General (Family Medicine) [name], RN as Oncology Nurse Navigator Rockwell Germany, RN as Oncology Nurse Navigator [name], MD as Consulting Physician (Hematology and Oncology) [name], MD as Consulting Physician (General Surgery) [name], MD as Attending Physician (Radiation Oncology)  DIAGNOSIS:    ICD-10-CM   1. Malignant neoplasm of upper-outer quadrant of right breast in female, estrogen receptor positive ([place])  C50.411    Z17.0     SUMMARY OF ONCOLOGIC HISTORY: Oncology History  Malignant neoplasm of upper-outer quadrant of right breast in female, estrogen receptor positive ([place])  1992 Miscellaneous   Left mastectomy followed by adjuvant chemotherapy   05/22/2019 Initial Diagnosis   Patient palpated a lump in the right breast. Mammogram showed 2.2cm mass in the UOQ, with no right axillary adenopathy. 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 The Common Hereditary Cancers Panel offered by [facility] includes sequencing and/or deletion duplication testing of the following 48 genes: APC, ATM, AXIN2, BARD1, BMPR1A, BRCA1, BRCA2, BRIP1, CDH1, CDKN2A (p14ARF), CDKN2A (p16INK4a), CKD4, CHEK2, CTNNA1, DICER1, EPCAM (Deletion/duplication testing only), GREM1 (promoter region deletion/duplication testing only), KIT, MEN1, MLH1, MSH2, MSH3, MSH6, MUTYH, NBN, NF1, NHTL1, PALB2, PDGFRA, PMS2, POLD1, POLE, PTEN, RAD50, RAD51C, RAD51D, RNF43, SDHB, SDHC, SDHD, SMAD4, SMARCA4. STK11, TP53, TSC1,  TSC2, and VHL.  The following genes were evaluated for sequence changes only: SDHA and HOXB13 c.251G>A variant only. The report date is 06/18/2019.   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   07/17/2019 Cancer Staging   Staging form: Breast, AJCC 8th Edition - Pathologic: Stage IIA (pT2, pN0, cM0, G3, ER+, PR-, HER2+) - Signed by [name], MD on 07/17/2019   08/10/2019 -  Chemotherapy   The patient had palonosetron (ALOXI) injection 0.25 mg, 0.25 mg, Intravenous,  Once, 6 of 6 cycles Administration: 0.25 mg (08/10/2019), 0.25 mg (08/31/2019), 0.25 mg (11/02/2019), 0.25 mg (11/23/2019), 0.25 mg (09/21/2019), 0.25 mg (10/12/2019) pegfilgrastim-jmdb (FULPHILA) injection 6 mg, 6 mg, Subcutaneous,  Once, 6 of 6 cycles Administration: 6 mg (08/12/2019), 6 mg (09/02/2019), 6 mg (11/04/2019), 6 mg (09/23/2019), 6 mg (10/14/2019) CARBOplatin (PARAPLATIN) 600 mg in sodium chloride 0.9 % 250 mL chemo infusion, 600 mg (99.3 % of original dose 604.2 mg), Intravenous,  Once, 6 of 6 cycles Dose modification:   (original dose 604.2 mg, Cycle 1), 600 mg (original dose 604.2 mg, Cycle 1),   (original dose 604.2 mg, Cycle 2, Reason: Provider Judgment) Administration: 600 mg (08/10/2019), 500 mg (08/31/2019), 500 mg (11/02/2019), 500 mg (11/23/2019), 500 mg (09/21/2019), 500 mg (10/12/2019) DOCEtaxel (TAXOTERE) 140 mg in sodium chloride 0.9 % 250 mL chemo infusion, 65 mg/m2 = 140 mg (86.7 % of original dose 75 mg/m2), Intravenous,  Once, 6 of 6 cycles Dose modification: 65 mg/m2 (original dose 75 mg/m2, Cycle 1, Reason: Provider Judgment), 50 mg/m2 (original dose 75 mg/m2, Cycle 2, Reason: Provider Judgment), 60 mg/m2 (original dose 75 mg/m2, Cycle 2, Reason: Dose not tolerated)  Administration: 140 mg (08/10/2019), 130 mg (08/31/2019), 130 mg (11/02/2019), 130 mg (11/23/2019), 130 mg (09/21/2019), 130 mg  (10/12/2019) pertuzumab (PERJETA) 420 mg in sodium chloride 0.9 % 250 mL chemo infusion, 420 mg (50 % of original dose 840 mg), Intravenous, Once, 6 of 6 cycles Dose modification: 420 mg (original dose 840 mg, Cycle 1, Reason: Provider Judgment) Administration: 420 mg (08/10/2019), 420 mg (08/31/2019), 420 mg (11/02/2019), 420 mg (11/23/2019), 420 mg (09/21/2019), 420 mg (10/12/2019) fosaprepitant (EMEND) 150 mg, dexamethasone (DECADRON) 12 mg in sodium chloride 0.9 % 145 mL IVPB, , Intravenous,  Once, 6 of 6 cycles Administration:  (08/10/2019),  (08/31/2019),  (11/02/2019),  (11/23/2019),  (09/21/2019),  (10/12/2019) trastuzumab-dkst (OGIVRI) 750 mg in sodium chloride 0.9 % 250 mL chemo infusion, 714 mg, Intravenous,  Once, 6 of 6 cycles Administration: 750 mg (08/10/2019), 600 mg (08/31/2019), 546 mg (11/02/2019), 546 mg (11/23/2019), 600 mg (09/21/2019), 546 mg (10/12/2019)  for chemotherapy treatment.      CHIEF COMPLIANT: Follow-up of Herceptin Perjeta maintenance  INTERVAL HISTORY: Marisa Spears is a 69 y.o. with above-mentioned history of HER-2 positive right breast cancerwho underwent a mastectomy and completed adjuvant chemotherapy withTCH Perjeta. She is currently on Herceptin Perjeta maintenance. Echo on 12/02/19 showed an ejection fraction of 60-65%. She presents to the clinic todayfortreatment.  ALLERGIES:  is allergic to codeine.  MEDICATIONS:  Current Outpatient Medications  Medication Sig Dispense Refill  . anastrozole (ARIMIDEX) 1 MG tablet Take 1 tablet (1 mg total) by mouth daily. (Patient not taking: Reported on 11/23/2019) 90 tablet 3  . aspirin EC 81 MG tablet Take 81 mg by mouth at bedtime.     . Biotin w/ Vitamins C & E (HAIR/SKIN/NAILS PO) Take 2 tablets by mouth daily.    . Calcium Carb-Cholecalciferol (CALCIUM 600+D3 PO) Take 1 tablet by mouth daily.    . Cholecalciferol (EQL VITAMIN D3) 50 MCG (2000 UT) CAPS Take 4,000 Units by mouth daily.    . diphenoxylate-atropine  (LOMOTIL) 2.5-0.025 MG tablet Take 1 tablet by mouth 2 (two) times daily. 60 tablet 3  . lidocaine-prilocaine (EMLA) cream Apply to affected area once 30 g 3  . LORazepam (ATIVAN) 0.5 MG tablet Take 1 tablet (0.5 mg total) by mouth at bedtime as needed for sleep. 30 tablet 2  . methocarbamol (ROBAXIN) 500 MG tablet Take 1 tablet (500 mg total) by mouth every 6 (six) hours as needed for muscle spasms. (Patient not taking: Reported on 11/23/2019) 30 tablet 1  . Omega-3 Fatty Acids (FISH OIL ULTRA) 1400 MG CAPS Take 1,400 mg by mouth 2 (two) times daily.    . ondansetron (ZOFRAN) 8 MG tablet Take 1 tablet (8 mg total) by mouth 2 (two) times daily as needed (Nausea or vomiting). Begin 4 days after chemotherapy. 30 tablet 1  . pravastatin (PRAVACHOL) 40 MG tablet Take 40 mg by mouth at bedtime.     . prochlorperazine (COMPAZINE) 10 MG tablet TAKE 1 TABLET BY MOUTH EVERY 6 HOURS AS NEEDED FOR NAUSEA AND VOMITING 30 tablet 0  . traMADol (ULTRAM) 50 MG tablet Take 1 tablet (50 mg total) by mouth every 6 (six) hours as needed (mild pain). (Patient not taking: Reported on 11/23/2019) 30 tablet 0  . triamterene-hydrochlorothiazide (MAXZIDE-25) 37.5-25 MG tablet Take 1 tablet by mouth daily.     Marland Kitchen venlafaxine XR (EFFEXOR XR) 37.5 MG 24 hr capsule Take 1 capsule (37.5 mg total) by mouth daily with breakfast. 30 capsule 6   No current facility-administered medications for  this visit.    PHYSICAL EXAMINATION: ECOG PERFORMANCE STATUS: 1 - Symptomatic but completely ambulatory  Vitals:   12/14/19 0932  BP: (!) 139/97  Pulse: 71  Resp: 17  Temp: 98 F (36.7 C)  SpO2: 99%   Filed Weights   12/14/19 0932  Weight: 201 lb (91.2 kg)    LABORATORY DATA:  I have reviewed the data as listed CMP Latest Ref Rng & Units 11/23/2019 11/02/2019 10/12/2019  Glucose 70 - 99 mg/dL 139(H) 111(H) 107(H)  BUN 8 - 23 mg/dL '8 8 8  ' Creatinine 0.44 - 1.00 mg/dL 0.72 0.70 0.70  Sodium 135 - 145 mmol/L 140 141 140    Potassium 3.5 - 5.1 mmol/L 3.4(L) 3.7 4.1  Chloride 98 - 111 mmol/L 103 106 104  CO2 22 - 32 mmol/L '25 27 26  ' Calcium 8.9 - 10.3 mg/dL 8.5(L) 8.3(L) 9.1  Total Protein 6.5 - 8.1 g/dL 6.5 6.8 6.7  Total Bilirubin 0.3 - 1.2 mg/dL 0.5 0.6 0.6  Alkaline Phos 38 - 126 U/L 64 65 71  AST 15 - 41 U/L 16 15 13(L)  ALT 0 - 44 U/L '9 9 10    ' Lab Results  Component Value Date   WBC 3.8 (L) 12/14/2019   HGB 10.1 (L) 12/14/2019   HCT 29.5 (L) 12/14/2019   MCV 98.0 12/14/2019   PLT 199 12/14/2019   NEUTROABS 2.5 12/14/2019    ASSESSMENT & PLAN:  Malignant neoplasm of upper-outer quadrant of right breast in female, estrogen receptor positive ([place]) 05/22/2019:Patient palpated a lump in the right breast. Mammogram showed 2.2cm mass in the UOQ, with no right axillary adenopathy. 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 Stage 2A 2.Adjuvant chemotherapy with TCH Perjeta x6 started 08/10/2019 completed 11/23/2019 followed by Herceptin Perjeta maintenance 3.Adjuvant antiestrogen therapy with anastrozole 1 mg daily x7 years [id number]: Taxane neuropathy study: No adverse effects due to participation in the study -------------------------------------------------------------------------------------------------------------------------- Treatment plan: Herceptin Perjeta maintenance along with anastrozole (to be started 12/14/2019) Toxicities: 1.  Severe diarrhea: On Imodium and Lomotil.  We are hoping that the diarrhea will be better since the chemo has been stopped. 2.  Fatigue due to chemotherapy 3.  Anxiety and depression: On Effexor 4.  Anemia due to prior  chemotherapy: Today's hemoglobin is 10.1.  We are monitoring it. She is hoping to get the Covid vaccine from her county in [place].  Return to clinic every 3 for Herceptin Perjeta maintenance every 6 weeks for follow-up with me.    No orders of the defined types were placed in this encounter.  The patient has a good understanding of the overall plan. she agrees with it. she will call with any problems that may develop before the next visit here.  Total time spent: 30 mins including face to face time and time spent for planning, charting and coordination of care  [name], MD 12/14/2019  I, Cloyde Reams Dorshimer, am acting as scribe for Dr. [name].  I have reviewed the above documentation for accuracy and completeness, and I agree with the above.

## 2019-12-14 ENCOUNTER — Inpatient Hospital Stay: Payer: Medicare Other

## 2019-12-14 ENCOUNTER — Inpatient Hospital Stay (HOSPITAL_BASED_OUTPATIENT_CLINIC_OR_DEPARTMENT_OTHER): Payer: Medicare Other | Admitting: Hematology and Oncology

## 2019-12-14 ENCOUNTER — Encounter: Payer: Self-pay | Admitting: *Deleted

## 2019-12-14 ENCOUNTER — Other Ambulatory Visit: Payer: Self-pay | Admitting: Hematology and Oncology

## 2019-12-14 ENCOUNTER — Inpatient Hospital Stay: Payer: Medicare Other | Attending: Hematology and Oncology

## 2019-12-14 ENCOUNTER — Other Ambulatory Visit: Payer: Self-pay

## 2019-12-14 VITALS — BP 143/78 | HR 67

## 2019-12-14 DIAGNOSIS — C50411 Malignant neoplasm of upper-outer quadrant of right female breast: Secondary | ICD-10-CM

## 2019-12-14 DIAGNOSIS — Z170 Estrogen receptor positive status [ER+]: Secondary | ICD-10-CM | POA: Insufficient documentation

## 2019-12-14 DIAGNOSIS — Z79899 Other long term (current) drug therapy: Secondary | ICD-10-CM | POA: Insufficient documentation

## 2019-12-14 DIAGNOSIS — Z95828 Presence of other vascular implants and grafts: Secondary | ICD-10-CM

## 2019-12-14 DIAGNOSIS — Z5112 Encounter for antineoplastic immunotherapy: Secondary | ICD-10-CM | POA: Diagnosis not present

## 2019-12-14 LAB — CBC WITH DIFFERENTIAL (CANCER CENTER ONLY)
Abs Immature Granulocytes: 0.02 10*3/uL (ref 0.00–0.07)
Basophils Absolute: 0 10*3/uL (ref 0.0–0.1)
Basophils Relative: 1 %
Eosinophils Absolute: 0 10*3/uL (ref 0.0–0.5)
Eosinophils Relative: 0 %
HCT: 29.5 % — ABNORMAL LOW (ref 36.0–46.0)
Hemoglobin: 10.1 g/dL — ABNORMAL LOW (ref 12.0–15.0)
Immature Granulocytes: 1 %
Lymphocytes Relative: 24 %
Lymphs Abs: 0.9 10*3/uL (ref 0.7–4.0)
MCH: 33.6 pg (ref 26.0–34.0)
MCHC: 34.2 g/dL (ref 30.0–36.0)
MCV: 98 fL (ref 80.0–100.0)
Monocytes Absolute: 0.4 10*3/uL (ref 0.1–1.0)
Monocytes Relative: 10 %
Neutro Abs: 2.5 10*3/uL (ref 1.7–7.7)
Neutrophils Relative %: 64 %
Platelet Count: 199 10*3/uL (ref 150–400)
RBC: 3.01 MIL/uL — ABNORMAL LOW (ref 3.87–5.11)
RDW: 14.7 % (ref 11.5–15.5)
WBC Count: 3.8 10*3/uL — ABNORMAL LOW (ref 4.0–10.5)
nRBC: 0 % (ref 0.0–0.2)

## 2019-12-14 LAB — CMP (CANCER CENTER ONLY)
ALT: 8 U/L (ref 0–44)
AST: 14 U/L — ABNORMAL LOW (ref 15–41)
Albumin: 3.7 g/dL (ref 3.5–5.0)
Alkaline Phosphatase: 57 U/L (ref 38–126)
Anion gap: 10 (ref 5–15)
BUN: 7 mg/dL — ABNORMAL LOW (ref 8–23)
CO2: 25 mmol/L (ref 22–32)
Calcium: 8.3 mg/dL — ABNORMAL LOW (ref 8.9–10.3)
Chloride: 106 mmol/L (ref 98–111)
Creatinine: 0.69 mg/dL (ref 0.44–1.00)
GFR, Est AFR Am: >60 mL/min (ref >60)
GFR, Estimated: >60 mL/min (ref >60)
Glucose, Bld: 118 mg/dL — ABNORMAL HIGH (ref 70–99)
Potassium: 3.4 mmol/L — ABNORMAL LOW (ref 3.5–5.1)
Sodium: 141 mmol/L (ref 135–145)
Total Bilirubin: 0.5 mg/dL (ref 0.3–1.2)
Total Protein: 6.4 g/dL — ABNORMAL LOW (ref 6.5–8.1)

## 2019-12-14 MED ORDER — DIPHENHYDRAMINE HCL 25 MG PO CAPS
ORAL_CAPSULE | ORAL | Status: AC
  Filled 2019-12-14: qty 1

## 2019-12-14 MED ORDER — HEPARIN SOD (PORK) LOCK FLUSH 100 UNIT/ML IV SOLN
500.0000 [IU] | Freq: Once | INTRAVENOUS | Status: AC | PRN
  Administered 2019-12-14: 14:00:00 500 [IU]
  Filled 2019-12-14: qty 5

## 2019-12-14 MED ORDER — SODIUM CHLORIDE 0.9 % IV SOLN
420.0000 mg | Freq: Once | INTRAVENOUS | Status: AC
  Administered 2019-12-14: 12:00:00 420 mg via INTRAVENOUS
  Filled 2019-12-14: qty 14

## 2019-12-14 MED ORDER — SODIUM CHLORIDE 0.9% FLUSH
10.0000 mL | INTRAVENOUS | Status: DC | PRN
  Administered 2019-12-14: 14:00:00 10 mL
  Filled 2019-12-14: qty 10

## 2019-12-14 MED ORDER — ACETAMINOPHEN 325 MG PO TABS
650.0000 mg | ORAL_TABLET | Freq: Once | ORAL | Status: AC
  Administered 2019-12-14: 11:00:00 650 mg via ORAL

## 2019-12-14 MED ORDER — DIPHENHYDRAMINE HCL 25 MG PO CAPS
25.0000 mg | ORAL_CAPSULE | Freq: Once | ORAL | Status: AC
  Administered 2019-12-14: 25 mg via ORAL

## 2019-12-14 MED ORDER — TRASTUZUMAB-DKST CHEMO 150 MG IV SOLR
6.0000 mg/kg | Freq: Once | INTRAVENOUS | Status: AC
  Administered 2019-12-14: 12:00:00 546 mg via INTRAVENOUS
  Filled 2019-12-14: qty 26

## 2019-12-14 MED ORDER — SODIUM CHLORIDE 0.9% FLUSH
10.0000 mL | INTRAVENOUS | Status: DC | PRN
  Administered 2019-12-14: 10 mL
  Filled 2019-12-14: qty 10

## 2019-12-14 MED ORDER — ACETAMINOPHEN 325 MG PO TABS
ORAL_TABLET | ORAL | Status: AC
  Filled 2019-12-14: qty 2

## 2019-12-14 MED ORDER — SODIUM CHLORIDE 0.9 % IV SOLN
Freq: Once | INTRAVENOUS | Status: AC
  Filled 2019-12-14: qty 250

## 2019-12-14 NOTE — Patient Instructions (Signed)
[facility] Discharge Instructions for Patients Receiving Chemotherapy  Today you received the following chemotherapy agents:  Trastuzumab, pertuzumab  To help prevent nausea and vomiting after your treatment, we encourage you to take your nausea medication as prescribed.   If you develop nausea and vomiting that is not controlled by your nausea medication, call the clinic.   BELOW ARE SYMPTOMS THAT SHOULD BE REPORTED IMMEDIATELY:  *FEVER GREATER THAN 100.5 F  *CHILLS WITH OR WITHOUT FEVER  NAUSEA AND VOMITING THAT IS NOT CONTROLLED WITH YOUR NAUSEA MEDICATION  *UNUSUAL SHORTNESS OF BREATH  *UNUSUAL BRUISING OR BLEEDING  TENDERNESS IN MOUTH AND THROAT WITH OR WITHOUT PRESENCE OF ULCERS  *URINARY PROBLEMS  *BOWEL PROBLEMS  UNUSUAL RASH Items with * indicate a potential emergency and should be followed up as soon as possible.  Feel free to call the clinic should you have any questions or concerns. The clinic phone number is [phone].  Please show the [facility] at check-in to the Emergency Department and triage nurse.

## 2019-12-14 NOTE — Assessment & Plan Note (Signed)
05/22/2019:Patient palpated a lump in the right breast. Mammogram showed 2.2cm mass in the UOQ, with no right axillary adenopathy. 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 Stage 2A 2.Adjuvant chemotherapy with TCH Perjeta x6 started 08/10/2019 completed 11/23/2019 followed by Herceptin Perjeta maintenance 3.Adjuvant antiestrogen therapy with anastrozole 1 mg daily x7 years [id number]: Taxane neuropathy study: No adverse effects due to participation in the study -------------------------------------------------------------------------------------------------------------------------- Treatment plan: Herceptin Perjeta maintenance along with anastrozole (to be started 12/14/2019) Toxicities: 1.  Severe diarrhea: On Imodium and Lomotil 2.  Fatigue due to chemotherapy 3.  Anxiety and depression: On Effexor  Return to clinic every 3 for Herceptin Perjeta maintenance every 6 weeks for follow-up with me.

## 2019-12-14 NOTE — Patient Instructions (Signed)

## 2020-01-04 ENCOUNTER — Inpatient Hospital Stay: Payer: Medicare Other | Attending: Hematology and Oncology

## 2020-01-04 ENCOUNTER — Other Ambulatory Visit: Payer: Self-pay

## 2020-01-04 ENCOUNTER — Other Ambulatory Visit: Payer: Self-pay | Admitting: Hematology and Oncology

## 2020-01-04 ENCOUNTER — Other Ambulatory Visit: Payer: Self-pay | Admitting: *Deleted

## 2020-01-04 VITALS — BP 156/90 | HR 63 | Temp 98.7°F | Resp 16 | Wt 202.0 lb

## 2020-01-04 DIAGNOSIS — Z170 Estrogen receptor positive status [ER+]: Secondary | ICD-10-CM | POA: Diagnosis not present

## 2020-01-04 DIAGNOSIS — C50411 Malignant neoplasm of upper-outer quadrant of right female breast: Secondary | ICD-10-CM | POA: Insufficient documentation

## 2020-01-04 DIAGNOSIS — Z79899 Other long term (current) drug therapy: Secondary | ICD-10-CM | POA: Insufficient documentation

## 2020-01-04 DIAGNOSIS — Z5112 Encounter for antineoplastic immunotherapy: Secondary | ICD-10-CM | POA: Diagnosis present

## 2020-01-04 MED ORDER — GABAPENTIN 100 MG PO CAPS: 100.0000 mg | ORAL_CAPSULE | Freq: Every day | ORAL | 0 refills | Status: DC

## 2020-01-04 MED ORDER — ACETAMINOPHEN 325 MG PO TABS
ORAL_TABLET | ORAL | Status: AC
  Filled 2020-01-04: qty 2

## 2020-01-04 MED ORDER — SODIUM CHLORIDE 0.9% FLUSH
10.0000 mL | INTRAVENOUS | Status: DC | PRN
  Administered 2020-01-04: 10 mL
  Filled 2020-01-04: qty 10

## 2020-01-04 MED ORDER — HEPARIN SOD (PORK) LOCK FLUSH 100 UNIT/ML IV SOLN
500.0000 [IU] | Freq: Once | INTRAVENOUS | Status: AC | PRN
  Administered 2020-01-04: 12:00:00 500 [IU]
  Filled 2020-01-04: qty 5

## 2020-01-04 MED ORDER — DIPHENHYDRAMINE HCL 25 MG PO CAPS
ORAL_CAPSULE | ORAL | Status: AC
  Filled 2020-01-04: qty 1

## 2020-01-04 MED ORDER — GABAPENTIN 100 MG PO CAPS: 100.0000 mg | ORAL_CAPSULE | Freq: Every day | ORAL | 3 refills | Status: DC

## 2020-01-04 MED ORDER — TRASTUZUMAB-DKST CHEMO 150 MG IV SOLR
6.0000 mg/kg | Freq: Once | INTRAVENOUS | Status: AC
  Administered 2020-01-04: 546 mg via INTRAVENOUS
  Filled 2020-01-04: qty 26

## 2020-01-04 MED ORDER — SODIUM CHLORIDE 0.9 % IV SOLN
420.0000 mg | Freq: Once | INTRAVENOUS | Status: AC
  Administered 2020-01-04: 420 mg via INTRAVENOUS
  Filled 2020-01-04: qty 14

## 2020-01-04 MED ORDER — DIPHENHYDRAMINE HCL 25 MG PO CAPS
25.0000 mg | ORAL_CAPSULE | Freq: Once | ORAL | Status: AC
  Administered 2020-01-04: 10:00:00 25 mg via ORAL

## 2020-01-04 MED ORDER — SODIUM CHLORIDE 0.9 % IV SOLN
Freq: Once | INTRAVENOUS | Status: AC
  Filled 2020-01-04: qty 250

## 2020-01-04 MED ORDER — ACETAMINOPHEN 325 MG PO TABS
650.0000 mg | ORAL_TABLET | Freq: Once | ORAL | Status: AC
  Administered 2020-01-04: 650 mg via ORAL

## 2020-01-04 NOTE — Patient Instructions (Signed)
[facility] Discharge Instructions for Patients Receiving Chemotherapy  Today you received the following chemotherapy agents:  Trastuzumab, pertuzumab  To help prevent nausea and vomiting after your treatment, we encourage you to take your nausea medication as prescribed.   If you develop nausea and vomiting that is not controlled by your nausea medication, call the clinic.   BELOW ARE SYMPTOMS THAT SHOULD BE REPORTED IMMEDIATELY:  *FEVER GREATER THAN 100.5 F  *CHILLS WITH OR WITHOUT FEVER  NAUSEA AND VOMITING THAT IS NOT CONTROLLED WITH YOUR NAUSEA MEDICATION  *UNUSUAL SHORTNESS OF BREATH  *UNUSUAL BRUISING OR BLEEDING  TENDERNESS IN MOUTH AND THROAT WITH OR WITHOUT PRESENCE OF ULCERS  *URINARY PROBLEMS  *BOWEL PROBLEMS  UNUSUAL RASH Items with * indicate a potential emergency and should be followed up as soon as possible.  Feel free to call the clinic should you have any questions or concerns. The clinic phone number is [phone].  Please show the [facility] at check-in to the Emergency Department and triage nurse.

## 2020-01-04 NOTE — Progress Notes (Signed)
Pt reports numbness and tingling in her toes which becomes painful at night.  Per MD pt to start taking gabapentin 100 mg once daily at night before bedtime.  MD states if this does not relieve symptoms, pt to call the office and he will increase the dosing.  Pt educated by Clarise Cruz, RN in infusion room and verbalized understanding.

## 2020-01-24 NOTE — Progress Notes (Signed)
Patient Care Team: Earney Mallet, MD as PCP - General (Family Medicine) [name], RN as Oncology Nurse Navigator Rockwell Germany, RN as Oncology Nurse Navigator [name], MD as Consulting Physician (Hematology and Oncology) [name], MD as Consulting Physician (General Surgery) [name], MD as Attending Physician (Radiation Oncology)  DIAGNOSIS:    ICD-10-CM   1. Malignant neoplasm of upper-outer quadrant of right breast in female, estrogen receptor positive ([place])  C50.411    Z17.0     SUMMARY OF ONCOLOGIC HISTORY: Oncology History  Malignant neoplasm of upper-outer quadrant of right breast in female, estrogen receptor positive ([place])  1992 Miscellaneous   Left mastectomy followed by adjuvant chemotherapy   05/22/2019 Initial Diagnosis   Patient palpated a lump in the right breast. Mammogram showed 2.2cm mass in the UOQ, with no right axillary adenopathy. 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 The Common Hereditary Cancers Panel offered by [facility] includes sequencing and/or deletion duplication testing of the following 48 genes: APC, ATM, AXIN2, BARD1, BMPR1A, BRCA1, BRCA2, BRIP1, CDH1, CDKN2A (p14ARF), CDKN2A (p16INK4a), CKD4, CHEK2, CTNNA1, DICER1, EPCAM (Deletion/duplication testing only), GREM1 (promoter region deletion/duplication testing only), KIT, MEN1, MLH1, MSH2, MSH3, MSH6, MUTYH, NBN, NF1, NHTL1, PALB2, PDGFRA, PMS2, POLD1, POLE, PTEN, RAD50, RAD51C, RAD51D, RNF43, SDHB, SDHC, SDHD, SMAD4, SMARCA4. STK11, TP53, TSC1,  TSC2, and VHL.  The following genes were evaluated for sequence changes only: SDHA and HOXB13 c.251G>A variant only. The report date is 06/18/2019.   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   07/17/2019 Cancer Staging   Staging form: Breast, AJCC 8th Edition - Pathologic: Stage IIA (pT2, pN0, cM0, G3, ER+, PR-, HER2+) - Signed by [name], MD on 07/17/2019   08/10/2019 - 12/13/2019 Chemotherapy   The patient had palonosetron (ALOXI) injection 0.25 mg, 0.25 mg, Intravenous,  Once, 6 of 6 cycles Administration: 0.25 mg (08/10/2019), 0.25 mg (08/31/2019), 0.25 mg (11/02/2019), 0.25 mg (11/23/2019), 0.25 mg (09/21/2019), 0.25 mg (10/12/2019) pegfilgrastim-jmdb (FULPHILA) injection 6 mg, 6 mg, Subcutaneous,  Once, 6 of 6 cycles Administration: 6 mg (08/12/2019), 6 mg (09/02/2019), 6 mg (11/04/2019), 6 mg (11/25/2019), 6 mg (09/23/2019), 6 mg (10/14/2019) CARBOplatin (PARAPLATIN) 600 mg in sodium chloride 0.9 % 250 mL chemo infusion, 600 mg (99.3 % of original dose 604.2 mg), Intravenous,  Once, 6 of 6 cycles Dose modification:   (original dose 604.2 mg, Cycle 1), 600 mg (original dose 604.2 mg, Cycle 1),   (original dose 604.2 mg, Cycle 2, Reason: Provider Judgment) Administration: 600 mg (08/10/2019), 500 mg (08/31/2019), 500 mg (11/02/2019), 500 mg (11/23/2019), 500 mg (09/21/2019), 500 mg (10/12/2019) DOCEtaxel (TAXOTERE) 140 mg in sodium chloride 0.9 % 250 mL chemo infusion, 65 mg/m2 = 140 mg (86.7 % of original dose 75 mg/m2), Intravenous,  Once, 6 of 6 cycles Dose modification: 65 mg/m2 (original dose 75 mg/m2, Cycle 1, Reason: Provider Judgment), 50 mg/m2 (original dose 75 mg/m2, Cycle 2, Reason: Provider Judgment), 60 mg/m2 (original dose 75 mg/m2, Cycle 2, Reason:  Dose not tolerated) Administration: 140 mg (08/10/2019), 130 mg (08/31/2019), 130 mg (11/02/2019), 130 mg (11/23/2019), 130 mg  (09/21/2019), 130 mg (10/12/2019) pertuzumab (PERJETA) 420 mg in sodium chloride 0.9 % 250 mL chemo infusion, 420 mg (50 % of original dose 840 mg), Intravenous, Once, 6 of 6 cycles Dose modification: 420 mg (original dose 840 mg, Cycle 1, Reason: Provider Judgment) Administration: 420 mg (08/10/2019), 420 mg (08/31/2019), 420 mg (11/02/2019), 420 mg (11/23/2019), 420 mg (09/21/2019), 420 mg (10/12/2019) fosaprepitant (EMEND) 150 mg, dexamethasone (DECADRON) 12 mg in sodium chloride 0.9 % 145 mL IVPB, , Intravenous,  Once, 6 of 6 cycles Administration:  (08/10/2019),  (08/31/2019),  (11/02/2019),  (11/23/2019),  (09/21/2019),  (10/12/2019) trastuzumab-dkst (OGIVRI) 750 mg in sodium chloride 0.9 % 250 mL chemo infusion, 714 mg, Intravenous,  Once, 6 of 6 cycles Administration: 750 mg (08/10/2019), 600 mg (08/31/2019), 546 mg (11/02/2019), 546 mg (11/23/2019), 600 mg (09/21/2019), 546 mg (10/12/2019)  for chemotherapy treatment.    12/14/2019 -  Chemotherapy   The patient had pertuzumab (PERJETA) 420 mg in sodium chloride 0.9 % 250 mL chemo infusion, 420 mg, Intravenous, Once, 2 of 13 cycles Administration: 420 mg (12/14/2019), 420 mg (01/04/2020) trastuzumab-dkst (OGIVRI) 546 mg in sodium chloride 0.9 % 250 mL chemo infusion, 6 mg/kg = 546 mg, Intravenous,  Once, 2 of 13 cycles Administration: 546 mg (12/14/2019), 546 mg (01/04/2020)  for chemotherapy treatment.      CHIEF COMPLIANT: Follow-up of Herceptin Perjeta maintenance  INTERVAL HISTORY: Marisa Spears is a 69 y.o. with above-mentioned history of HER-2 positive right breast cancerwho underwent a mastectomy, completed adjuvant chemotherapy, and is currently on Herceptin Perjeta maintenance. She presents to the clinic todayfortreatment.   ALLERGIES:  is allergic to codeine.  MEDICATIONS:  Current Outpatient Medications  Medication Sig Dispense Refill  . anastrozole (ARIMIDEX) 1 MG tablet Take 1 tablet (1 mg total) by mouth daily. (Patient not taking:  Reported on 11/23/2019) 90 tablet 3  . aspirin EC 81 MG tablet Take 81 mg by mouth at bedtime.     . Biotin w/ Vitamins C & E (HAIR/SKIN/NAILS PO) Take 2 tablets by mouth daily.    . Calcium Carb-Cholecalciferol (CALCIUM 600+D3 PO) Take 1 tablet by mouth daily.    . Cholecalciferol (EQL VITAMIN D3) 50 MCG (2000 UT) CAPS Take 4,000 Units by mouth daily.    . diphenoxylate-atropine (LOMOTIL) 2.5-0.025 MG tablet Take 1 tablet by mouth 2 (two) times daily. 60 tablet 3  . gabapentin (NEURONTIN) 100 MG capsule Take 1 capsule (100 mg total) by mouth at bedtime. 30 capsule 3  . methocarbamol (ROBAXIN) 500 MG tablet Take 1 tablet (500 mg total) by mouth every 6 (six) hours as needed for muscle spasms. (Patient not taking: Reported on 11/23/2019) 30 tablet 1  . Omega-3 Fatty Acids (FISH OIL ULTRA) 1400 MG CAPS Take 1,400 mg by mouth 2 (two) times daily.    . pravastatin (PRAVACHOL) 40 MG tablet Take 40 mg by mouth at bedtime.     . traMADol (ULTRAM) 50 MG tablet Take 1 tablet (50 mg total) by mouth every 6 (six) hours as needed (mild pain). (Patient not taking: Reported on 11/23/2019) 30 tablet 0  . triamterene-hydrochlorothiazide (MAXZIDE-25) 37.5-25 MG tablet Take 1 tablet by mouth daily.     Marland Kitchen venlafaxine XR (EFFEXOR XR) 37.5 MG 24 hr capsule Take 1 capsule (37.5 mg total) by mouth daily with breakfast. 30 capsule 6   No current facility-administered medications for this visit.  PHYSICAL EXAMINATION: ECOG PERFORMANCE STATUS: 1 - Symptomatic but completely ambulatory  Vitals:   01/25/20 1037  BP: 129/90  Pulse: 70  Resp: 18  Temp: 98.5 F (36.9 C)  SpO2: 97%   Filed Weights   01/25/20 1037  Weight: 199 lb 11.2 oz (90.6 kg)    LABORATORY DATA:  I have reviewed the data as listed CMP Latest Ref Rng & Units 12/14/2019 11/23/2019 11/02/2019  Glucose 70 - 99 mg/dL 118(H) 139(H) 111(H)  BUN 8 - 23 mg/dL 7(L) 8 8  Creatinine 0.44 - 1.00 mg/dL 0.69 0.72 0.70  Sodium 135 - 145 mmol/L 141 140  141  Potassium 3.5 - 5.1 mmol/L 3.4(L) 3.4(L) 3.7  Chloride 98 - 111 mmol/L 106 103 106  CO2 22 - 32 mmol/L '25 25 27  '$ Calcium 8.9 - 10.3 mg/dL 8.3(L) 8.5(L) 8.3(L)  Total Protein 6.5 - 8.1 g/dL 6.4(L) 6.5 6.8  Total Bilirubin 0.3 - 1.2 mg/dL 0.5 0.5 0.6  Alkaline Phos 38 - 126 U/L 57 64 65  AST 15 - 41 U/L 14(L) 16 15  ALT 0 - 44 U/L '8 9 9    '$ Lab Results  Component Value Date   WBC 5.1 01/25/2020   HGB 11.9 (L) 01/25/2020   HCT 34.9 (L) 01/25/2020   MCV 93.3 01/25/2020   PLT 196 01/25/2020   NEUTROABS 3.4 01/25/2020    ASSESSMENT & PLAN:  Malignant neoplasm of upper-outer quadrant of right breast in female, estrogen receptor positive ([place]) 05/22/2019:Patient palpated a lump in the right breast. Mammogram showed 2.2cm mass in the UOQ, with no right axillary adenopathy. 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 Stage 2A 2.Adjuvant chemotherapy with TCH Perjeta x6 started 08/10/2019 completed 11/23/2019 followed by Herceptin Perjeta maintenance 3.Adjuvant antiestrogen therapy with anastrozole 1 mg daily x7 years [id number]: Taxane neuropathy study: No adverse effects due to participation in the study -------------------------------------------------------------------------------------------------------------------------- Treatment plan: Herceptin Perjeta maintenance along with anastrozole (to be started 12/14/2019) Toxicities: 1.  Severe diarrhea: On as needed Imodium.  Much better 2.  Fatigue: Much improved 3.  Anxiety and depression: On Effexor 4.  Anemia due to prior chemotherapy: Today's hemoglobin is 11.9.  5.  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  I discussed with her that it could take time for the neuropathy to get better.  She has it mostly in her feet.  Return to clinic every 3 for Herceptin Perjeta maintenance every 6 weeks for follow-up with me.   No orders of the defined types were placed in this encounter.  The patient has a good understanding of the overall plan. she agrees with it. she will call with any problems that may develop before the next visit here.  Total time spent: 30 mins including face to face time and time spent for planning, charting and coordination of care  [name], MD 01/25/2020  I, Cloyde Reams Dorshimer, am acting as scribe for Dr. [name].  I have reviewed the above documentation for accuracy and completeness, and I agree with the above.

## 2020-01-25 ENCOUNTER — Inpatient Hospital Stay: Payer: Medicare Other

## 2020-01-25 ENCOUNTER — Other Ambulatory Visit: Payer: Self-pay

## 2020-01-25 ENCOUNTER — Inpatient Hospital Stay (HOSPITAL_BASED_OUTPATIENT_CLINIC_OR_DEPARTMENT_OTHER): Payer: Medicare Other | Admitting: Hematology and Oncology

## 2020-01-25 ENCOUNTER — Encounter: Payer: Self-pay | Admitting: *Deleted

## 2020-01-25 ENCOUNTER — Inpatient Hospital Stay: Payer: Medicare Other | Attending: Hematology and Oncology

## 2020-01-25 DIAGNOSIS — C50411 Malignant neoplasm of upper-outer quadrant of right female breast: Secondary | ICD-10-CM

## 2020-01-25 DIAGNOSIS — Z170 Estrogen receptor positive status [ER+]: Secondary | ICD-10-CM

## 2020-01-25 DIAGNOSIS — Z5112 Encounter for antineoplastic immunotherapy: Secondary | ICD-10-CM | POA: Diagnosis present

## 2020-01-25 DIAGNOSIS — Z79899 Other long term (current) drug therapy: Secondary | ICD-10-CM | POA: Insufficient documentation

## 2020-01-25 LAB — CBC WITH DIFFERENTIAL (CANCER CENTER ONLY)
Abs Immature Granulocytes: 0.01 10*3/uL (ref 0.00–0.07)
Basophils Absolute: 0 10*3/uL (ref 0.0–0.1)
Basophils Relative: 0 %
Eosinophils Absolute: 0.1 10*3/uL (ref 0.0–0.5)
Eosinophils Relative: 2 %
HCT: 34.9 % — ABNORMAL LOW (ref 36.0–46.0)
Hemoglobin: 11.9 g/dL — ABNORMAL LOW (ref 12.0–15.0)
Immature Granulocytes: 0 %
Lymphocytes Relative: 25 %
Lymphs Abs: 1.3 10*3/uL (ref 0.7–4.0)
MCH: 31.8 pg (ref 26.0–34.0)
MCHC: 34.1 g/dL (ref 30.0–36.0)
MCV: 93.3 fL (ref 80.0–100.0)
Monocytes Absolute: 0.3 10*3/uL (ref 0.1–1.0)
Monocytes Relative: 6 %
Neutro Abs: 3.4 10*3/uL (ref 1.7–7.7)
Neutrophils Relative %: 67 %
Platelet Count: 196 10*3/uL (ref 150–400)
RBC: 3.74 MIL/uL — ABNORMAL LOW (ref 3.87–5.11)
RDW: 11.9 % (ref 11.5–15.5)
WBC Count: 5.1 10*3/uL (ref 4.0–10.5)
nRBC: 0 % (ref 0.0–0.2)

## 2020-01-25 LAB — CMP (CANCER CENTER ONLY)
ALT: 10 U/L (ref 0–44)
AST: 15 U/L (ref 15–41)
Albumin: 4 g/dL (ref 3.5–5.0)
Alkaline Phosphatase: 61 U/L (ref 38–126)
Anion gap: 8 (ref 5–15)
BUN: 10 mg/dL (ref 8–23)
CO2: 29 mmol/L (ref 22–32)
Calcium: 9.3 mg/dL (ref 8.9–10.3)
Chloride: 104 mmol/L (ref 98–111)
Creatinine: 0.71 mg/dL (ref 0.44–1.00)
GFR, Est AFR Am: >60 mL/min (ref >60)
GFR, Estimated: >60 mL/min (ref >60)
Glucose, Bld: 103 mg/dL — ABNORMAL HIGH (ref 70–99)
Potassium: 4 mmol/L (ref 3.5–5.1)
Sodium: 141 mmol/L (ref 135–145)
Total Bilirubin: 0.5 mg/dL (ref 0.3–1.2)
Total Protein: 7.2 g/dL (ref 6.5–8.1)

## 2020-01-25 MED ORDER — DIPHENHYDRAMINE HCL 25 MG PO CAPS
25.0000 mg | ORAL_CAPSULE | Freq: Once | ORAL | Status: AC
  Administered 2020-01-25: 25 mg via ORAL

## 2020-01-25 MED ORDER — HEPARIN SOD (PORK) LOCK FLUSH 100 UNIT/ML IV SOLN
500.0000 [IU] | Freq: Once | INTRAVENOUS | Status: AC | PRN
  Administered 2020-01-25: 500 [IU]
  Filled 2020-01-25: qty 5

## 2020-01-25 MED ORDER — ACETAMINOPHEN 325 MG PO TABS
650.0000 mg | ORAL_TABLET | Freq: Once | ORAL | Status: AC
  Administered 2020-01-25: 650 mg via ORAL

## 2020-01-25 MED ORDER — DIPHENHYDRAMINE HCL 25 MG PO CAPS
ORAL_CAPSULE | ORAL | Status: AC
  Filled 2020-01-25: qty 1

## 2020-01-25 MED ORDER — TRASTUZUMAB-DKST CHEMO 150 MG IV SOLR
6.0000 mg/kg | Freq: Once | INTRAVENOUS | Status: AC
  Administered 2020-01-25: 546 mg via INTRAVENOUS
  Filled 2020-01-25: qty 26

## 2020-01-25 MED ORDER — SODIUM CHLORIDE 0.9 % IV SOLN
420.0000 mg | Freq: Once | INTRAVENOUS | Status: AC
  Administered 2020-01-25: 420 mg via INTRAVENOUS
  Filled 2020-01-25: qty 14

## 2020-01-25 MED ORDER — SODIUM CHLORIDE 0.9 % IV SOLN
Freq: Once | INTRAVENOUS | Status: AC
  Filled 2020-01-25: qty 250

## 2020-01-25 MED ORDER — ACETAMINOPHEN 325 MG PO TABS
ORAL_TABLET | ORAL | Status: AC
  Filled 2020-01-25: qty 2

## 2020-01-25 MED ORDER — SODIUM CHLORIDE 0.9% FLUSH
10.0000 mL | INTRAVENOUS | Status: DC | PRN
  Administered 2020-01-25: 10 mL
  Filled 2020-01-25: qty 10

## 2020-01-25 NOTE — Assessment & Plan Note (Signed)
05/22/2019:Patient palpated a lump in the right breast. Mammogram showed 2.2cm mass in the UOQ, with no right axillary adenopathy. 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 Stage 2A 2.Adjuvant chemotherapy with TCH Perjeta x6 started 08/10/2019 completed 11/23/2019 followed by Herceptin Perjeta maintenance 3.Adjuvant antiestrogen therapy with anastrozole 1 mg daily x7 years [id number]: Taxane neuropathy study: No adverse effects due to participation in the study -------------------------------------------------------------------------------------------------------------------------- Treatment plan: Herceptin Perjeta maintenance along with anastrozole (to be started 12/14/2019) Toxicities: 1.  Severe diarrhea: On Imodium and Lomotil.  We are hoping that the diarrhea will be better since the chemo has been stopped. 2.  Fatigue due to chemotherapy 3.  Anxiety and depression: On Effexor 4.  Anemia due to prior chemotherapy: Today's hemoglobin is .  We are monitoring it. She is hoping to get the Covid vaccine from her county in [place].  Return to clinic every 3 for Herceptin Perjeta maintenance every 6 weeks for follow-up with me.

## 2020-01-25 NOTE — Patient Instructions (Signed)
[facility] Discharge Instructions for Patients Receiving Chemotherapy  Today you received the following chemotherapy agents: Trastuzumab, Pertuzumab  To help prevent nausea and vomiting after your treatment, we encourage you to take your nausea medication as directed.   If you develop nausea and vomiting that is not controlled by your nausea medication, call the clinic.   BELOW ARE SYMPTOMS THAT SHOULD BE REPORTED IMMEDIATELY:  *FEVER GREATER THAN 100.5 F  *CHILLS WITH OR WITHOUT FEVER  NAUSEA AND VOMITING THAT IS NOT CONTROLLED WITH YOUR NAUSEA MEDICATION  *UNUSUAL SHORTNESS OF BREATH  *UNUSUAL BRUISING OR BLEEDING  TENDERNESS IN MOUTH AND THROAT WITH OR WITHOUT PRESENCE OF ULCERS  *URINARY PROBLEMS  *BOWEL PROBLEMS  UNUSUAL RASH Items with * indicate a potential emergency and should be followed up as soon as possible.  Feel free to call the clinic should you have any questions or concerns. The clinic phone number is [phone].  Please show the CHEMO ALERT CARD at check-in to the Emergency Department and triage nurse.   

## 2020-02-15 ENCOUNTER — Other Ambulatory Visit: Payer: Self-pay

## 2020-02-15 ENCOUNTER — Inpatient Hospital Stay: Payer: Medicare Other

## 2020-02-15 VITALS — BP 132/80 | HR 60 | Temp 98.2°F | Resp 18 | Wt 199.0 lb

## 2020-02-15 DIAGNOSIS — Z5112 Encounter for antineoplastic immunotherapy: Secondary | ICD-10-CM | POA: Diagnosis not present

## 2020-02-15 DIAGNOSIS — Z170 Estrogen receptor positive status [ER+]: Secondary | ICD-10-CM

## 2020-02-15 DIAGNOSIS — C50411 Malignant neoplasm of upper-outer quadrant of right female breast: Secondary | ICD-10-CM

## 2020-02-15 MED ORDER — ACETAMINOPHEN 325 MG PO TABS
650.0000 mg | ORAL_TABLET | Freq: Once | ORAL | Status: AC
  Administered 2020-02-15: 650 mg via ORAL

## 2020-02-15 MED ORDER — DIPHENHYDRAMINE HCL 25 MG PO CAPS
ORAL_CAPSULE | ORAL | Status: AC
  Filled 2020-02-15: qty 1

## 2020-02-15 MED ORDER — DIPHENHYDRAMINE HCL 25 MG PO CAPS
25.0000 mg | ORAL_CAPSULE | Freq: Once | ORAL | Status: AC
  Administered 2020-02-15: 25 mg via ORAL

## 2020-02-15 MED ORDER — TRASTUZUMAB-DKST CHEMO 150 MG IV SOLR
6.0000 mg/kg | Freq: Once | INTRAVENOUS | Status: AC
  Administered 2020-02-15: 546 mg via INTRAVENOUS
  Filled 2020-02-15: qty 26

## 2020-02-15 MED ORDER — SODIUM CHLORIDE 0.9 % IV SOLN
420.0000 mg | Freq: Once | INTRAVENOUS | Status: AC
  Administered 2020-02-15: 420 mg via INTRAVENOUS
  Filled 2020-02-15: qty 14

## 2020-02-15 MED ORDER — ACETAMINOPHEN 325 MG PO TABS
ORAL_TABLET | ORAL | Status: AC
  Filled 2020-02-15: qty 2

## 2020-02-15 MED ORDER — HEPARIN SOD (PORK) LOCK FLUSH 100 UNIT/ML IV SOLN
500.0000 [IU] | Freq: Once | INTRAVENOUS | Status: AC | PRN
  Administered 2020-02-15: 500 [IU]
  Filled 2020-02-15: qty 5

## 2020-02-15 MED ORDER — SODIUM CHLORIDE 0.9% FLUSH
10.0000 mL | INTRAVENOUS | Status: DC | PRN
  Administered 2020-02-15: 10 mL
  Filled 2020-02-15: qty 10

## 2020-02-15 MED ORDER — SODIUM CHLORIDE 0.9 % IV SOLN
Freq: Once | INTRAVENOUS | Status: AC
  Filled 2020-02-15: qty 250

## 2020-02-15 NOTE — Patient Instructions (Signed)
[facility] Discharge Instructions for Patients Receiving Chemotherapy  Today you received the following chemotherapy agents: Trastuzumab, Pertuzumab  To help prevent nausea and vomiting after your treatment, we encourage you to take your nausea medication as directed.   If you develop nausea and vomiting that is not controlled by your nausea medication, call the clinic.   BELOW ARE SYMPTOMS THAT SHOULD BE REPORTED IMMEDIATELY:  *FEVER GREATER THAN 100.5 F  *CHILLS WITH OR WITHOUT FEVER  NAUSEA AND VOMITING THAT IS NOT CONTROLLED WITH YOUR NAUSEA MEDICATION  *UNUSUAL SHORTNESS OF BREATH  *UNUSUAL BRUISING OR BLEEDING  TENDERNESS IN MOUTH AND THROAT WITH OR WITHOUT PRESENCE OF ULCERS  *URINARY PROBLEMS  *BOWEL PROBLEMS  UNUSUAL RASH Items with * indicate a potential emergency and should be followed up as soon as possible.  Feel free to call the clinic should you have any questions or concerns. The clinic phone number is [phone].  Please show the CHEMO ALERT CARD at check-in to the Emergency Department and triage nurse.   

## 2020-02-17 IMAGING — MG MM BREAST LOCALIZATION CLIP
4 series · 4 of 12 positions shown · non-contrast
Comparison: Previous exam(s).

CLINICAL DATA: Confirmation of clip placement after
ultrasound-guided core needle biopsy of a highly suspicious mass
involving the UPPER OUTER QUADRANT of the RIGHT breast.

EXAM:
3D TOMOSYNTHESIS DIAGNOSTIC RIGHT MAMMOGRAM POST ULTRASOUND BIOPSY

[R ML synth-2D]
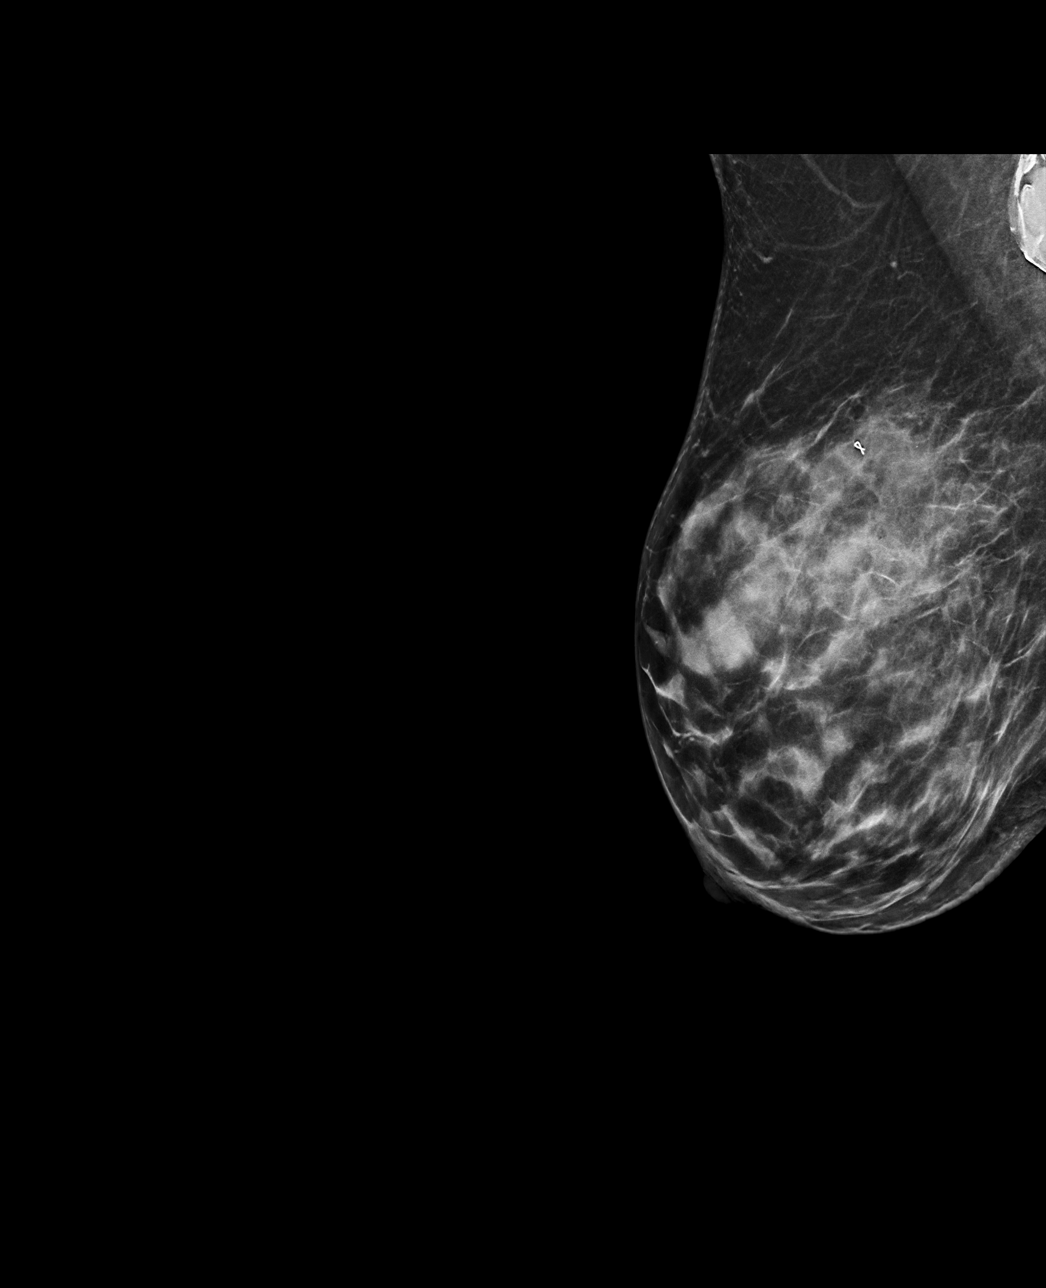

[R CC synth-2D]
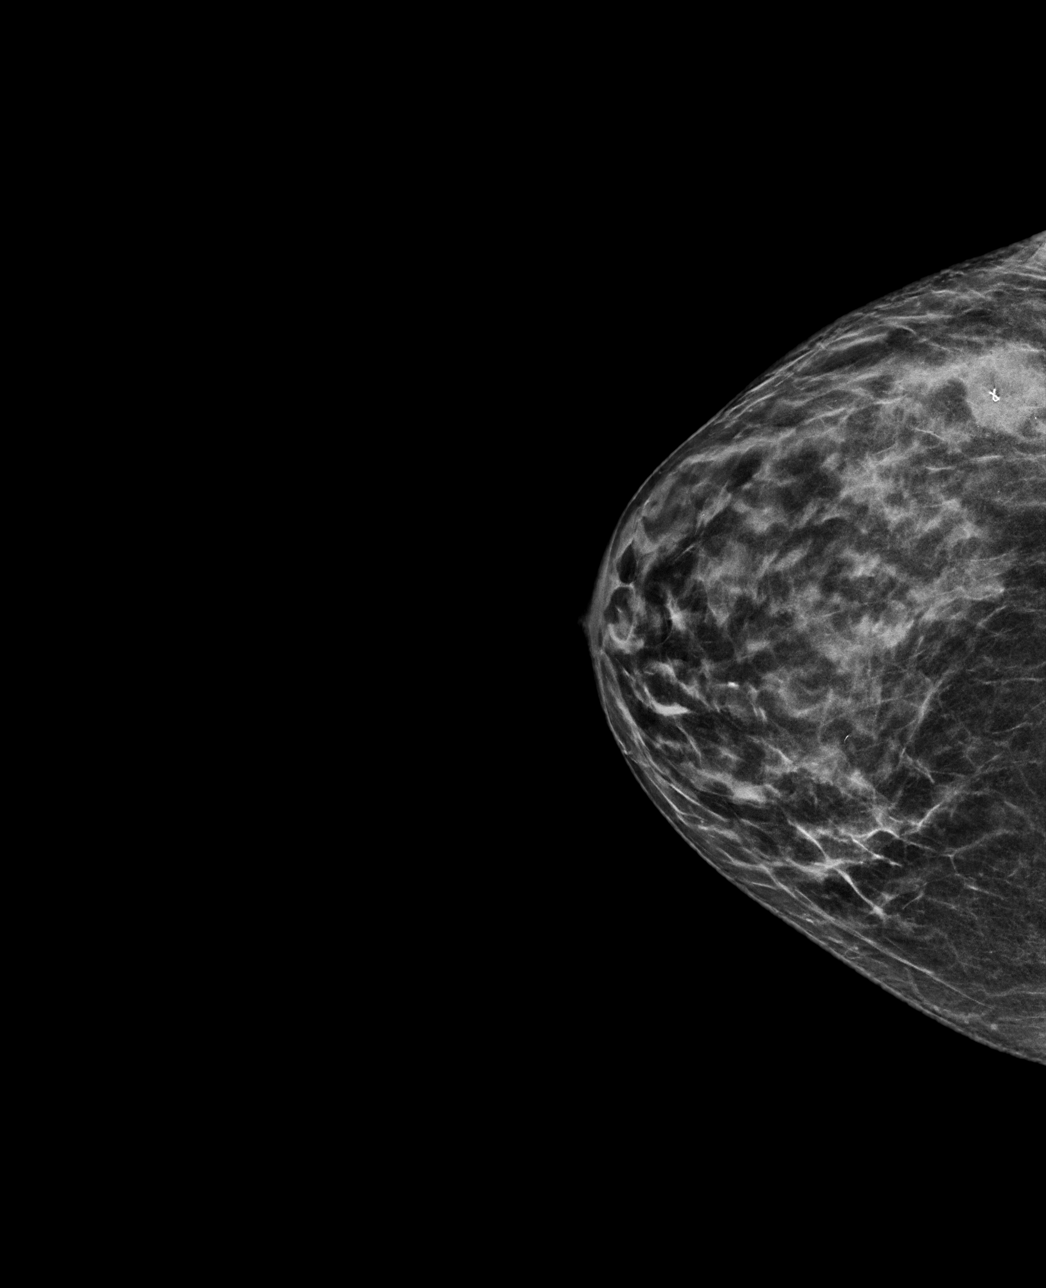

[R ML tomo · tomo slice 36/71.0]
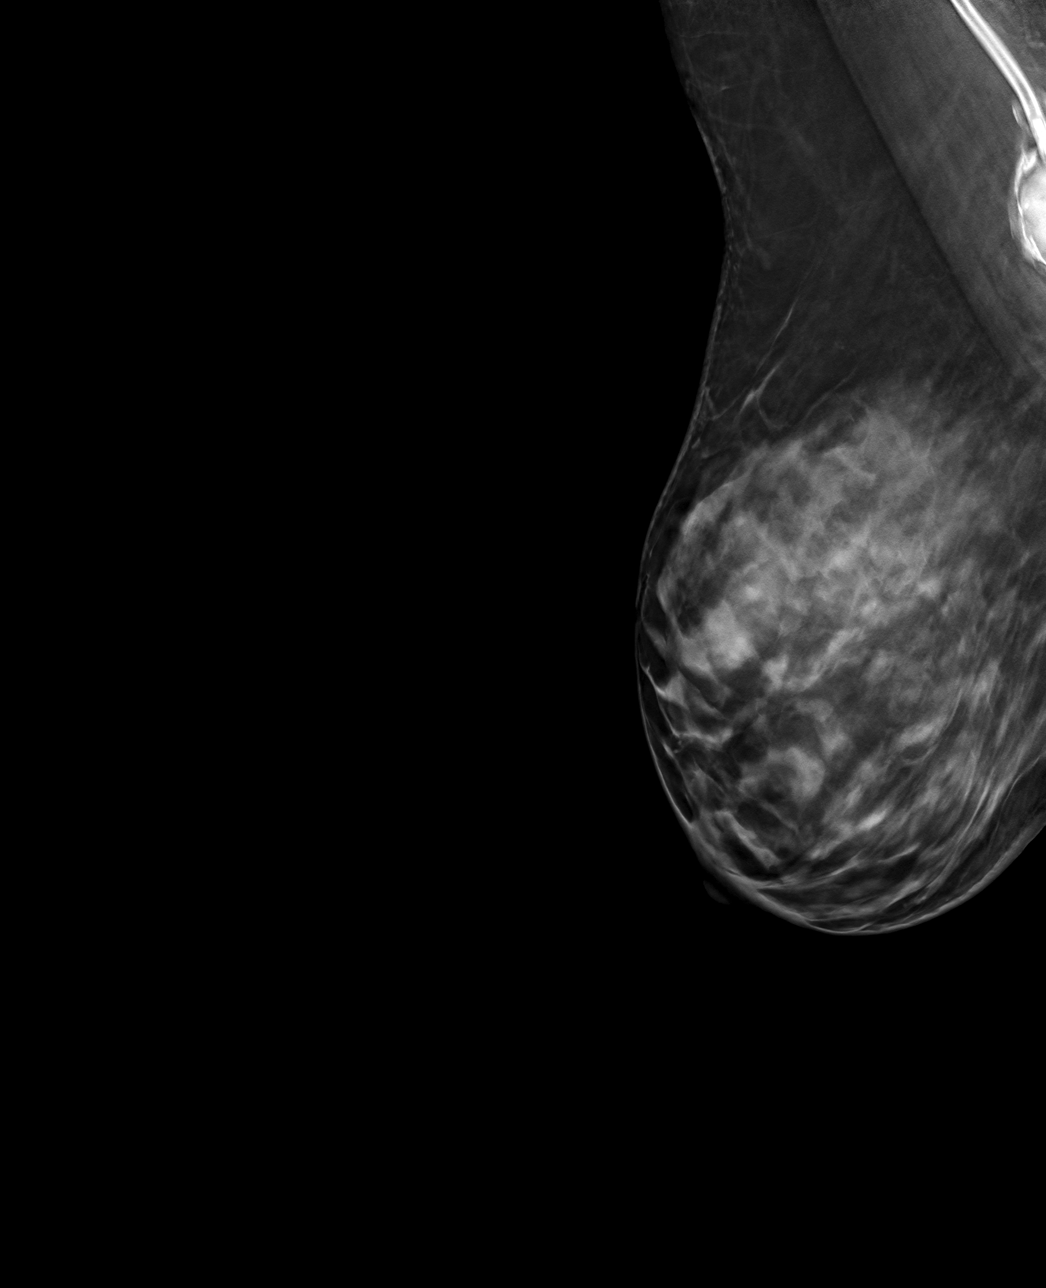

[R CC tomo · tomo slice 31/61.0]
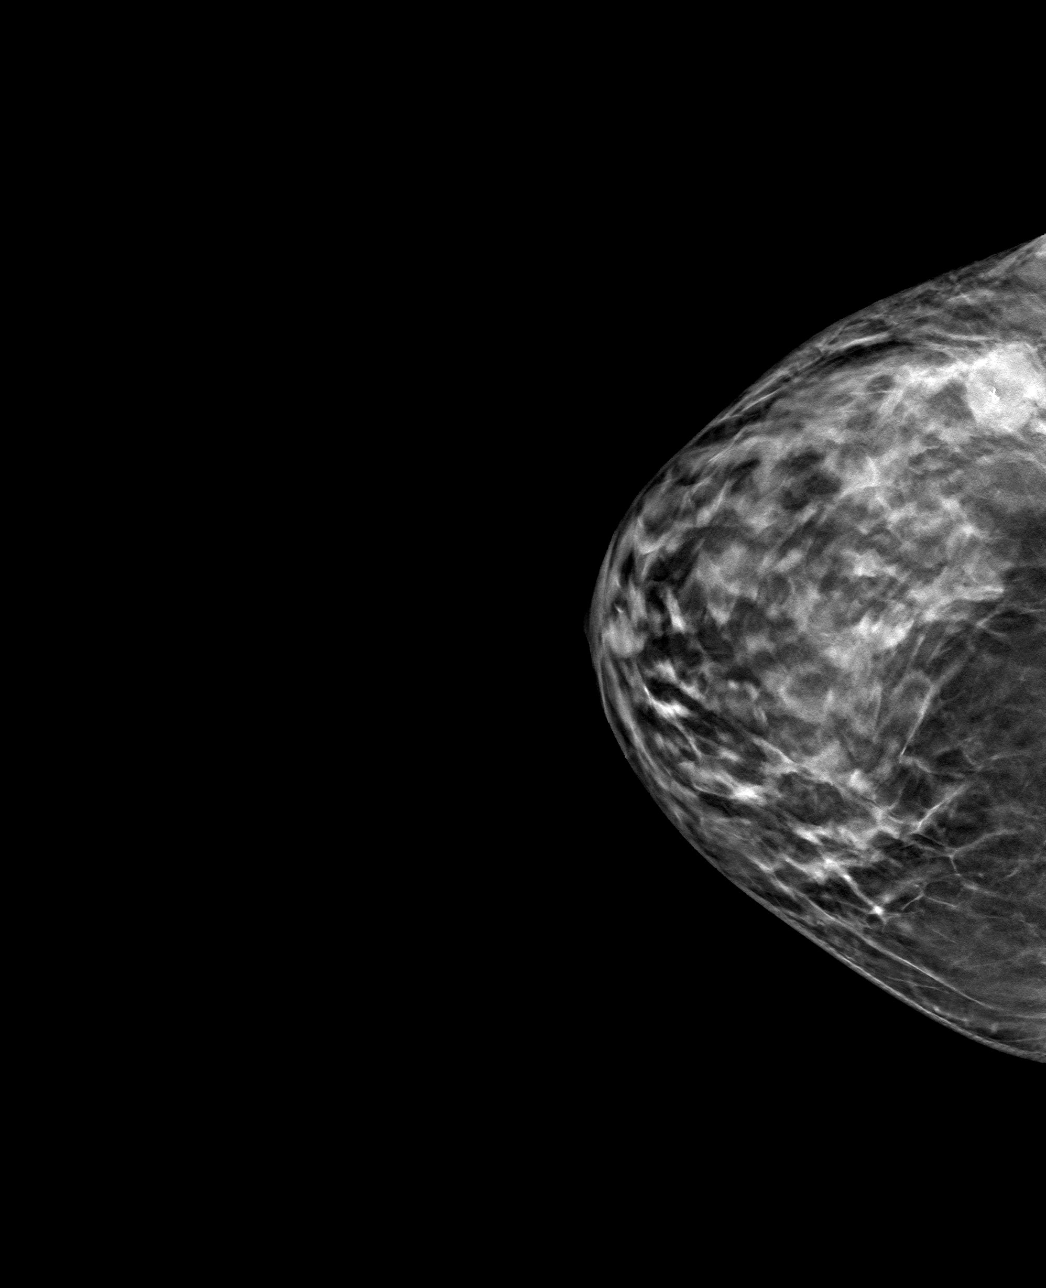

[4 of 12 positions shown; findings below may reference images not displayed]

FINDINGS: 3D tomosynthesis images were obtained following ultrasound guided
biopsy of a highly suspicious mass involving the UPPER OUTER
QUADRANT of the RIGHT breast. The ribbon shaped tissue marker clip
is appropriately positioned within the biopsied mass.

Expected post biopsy changes are present without evidence of
hematoma. The port from the indwelling Port-A-Cath overlies the low
RIGHT axilla on the MLO view.
IMPRESSION: Appropriate positioning of the ribbon shaped tissue marker clip
within the biopsied mass in the UPPER OUTER QUADRANT of the RIGHT
breast.

Final Assessment: Post Procedure Mammograms for Marker Placement

## 2020-02-22 ENCOUNTER — Other Ambulatory Visit: Payer: Self-pay | Admitting: Hematology and Oncology

## 2020-02-22 DIAGNOSIS — C50411 Malignant neoplasm of upper-outer quadrant of right female breast: Secondary | ICD-10-CM

## 2020-02-25 IMAGING — CR CHEST - 2 VIEW
2 series · 2 of 2 positions shown · non-contrast
Comparison: No prior.

CLINICAL DATA: Breast cancer.

EXAM:
CHEST - 2 VIEW

[w chest pa]
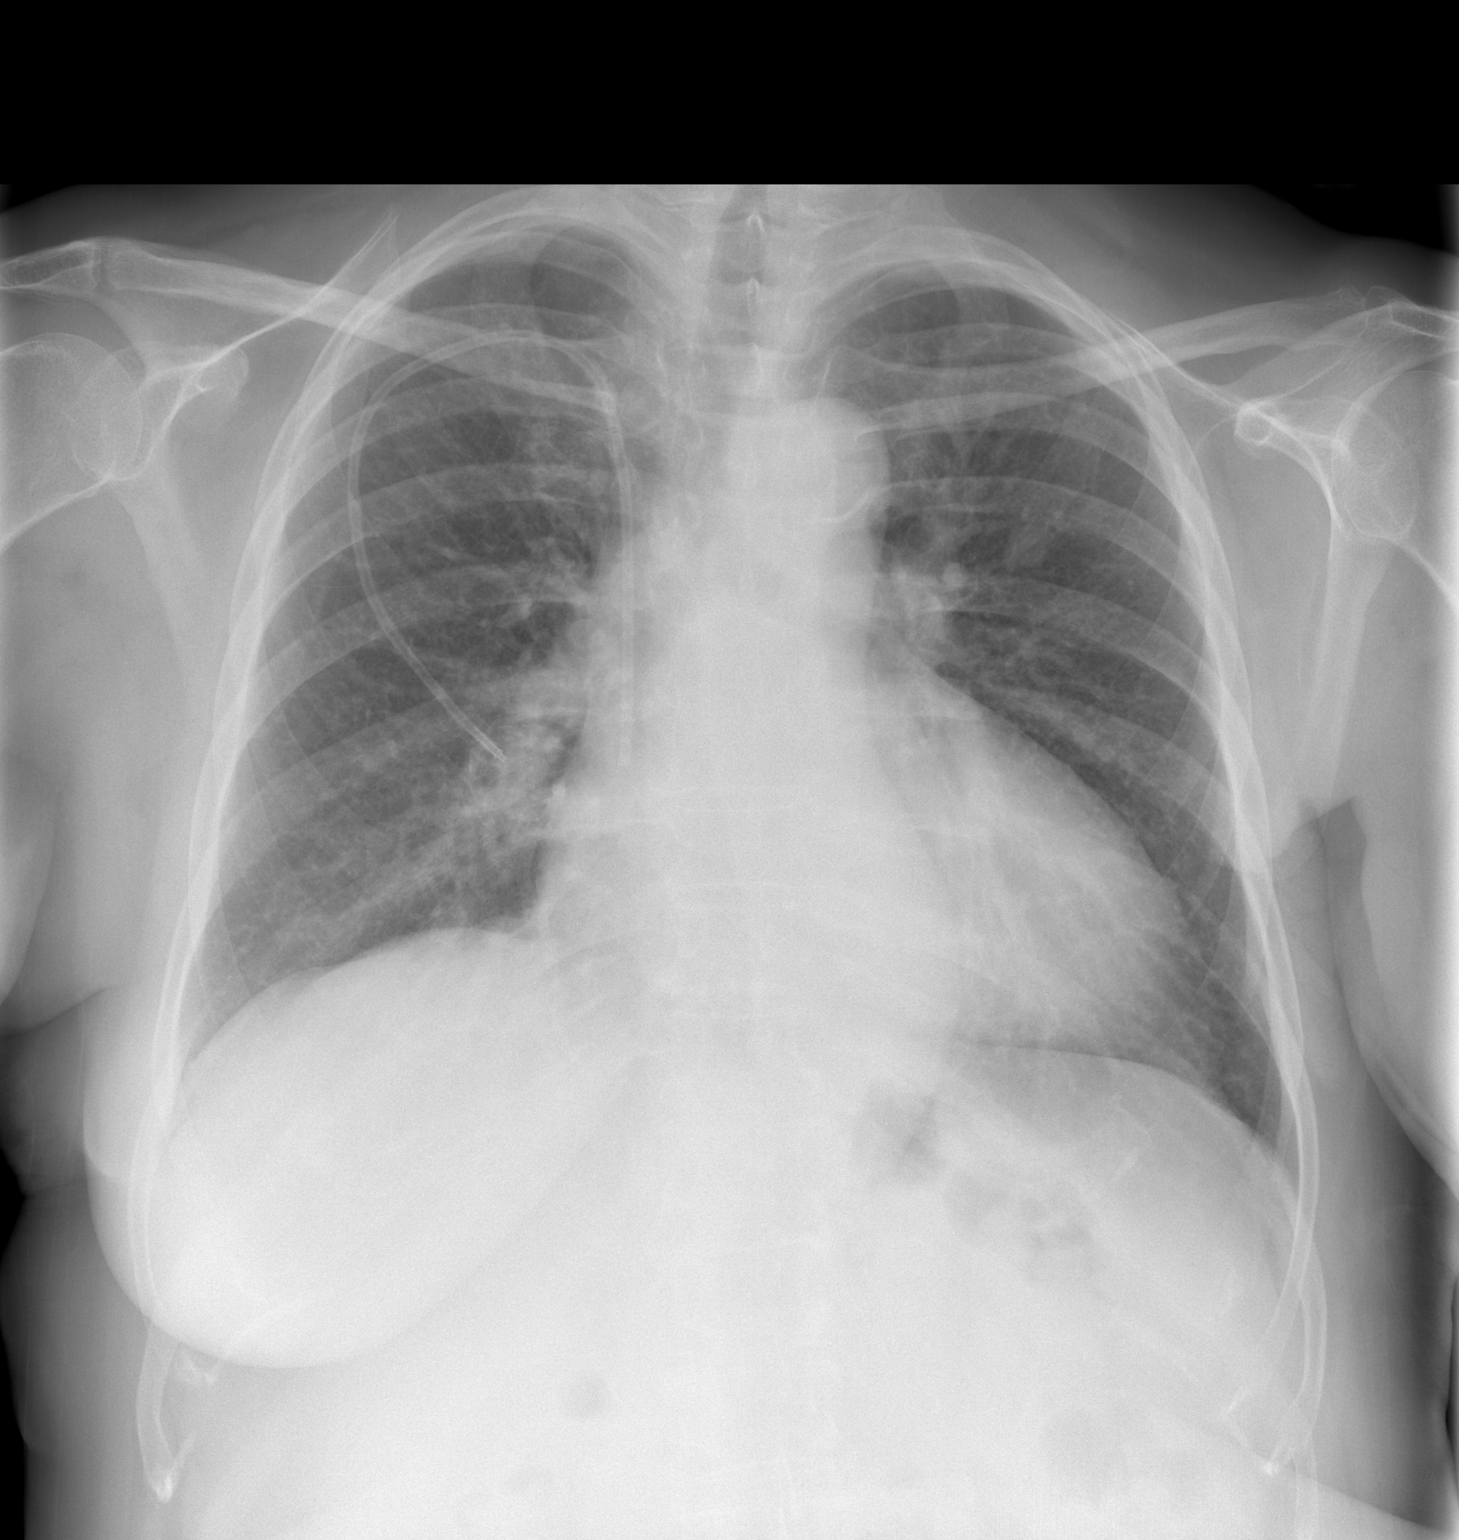

[w chest lat]
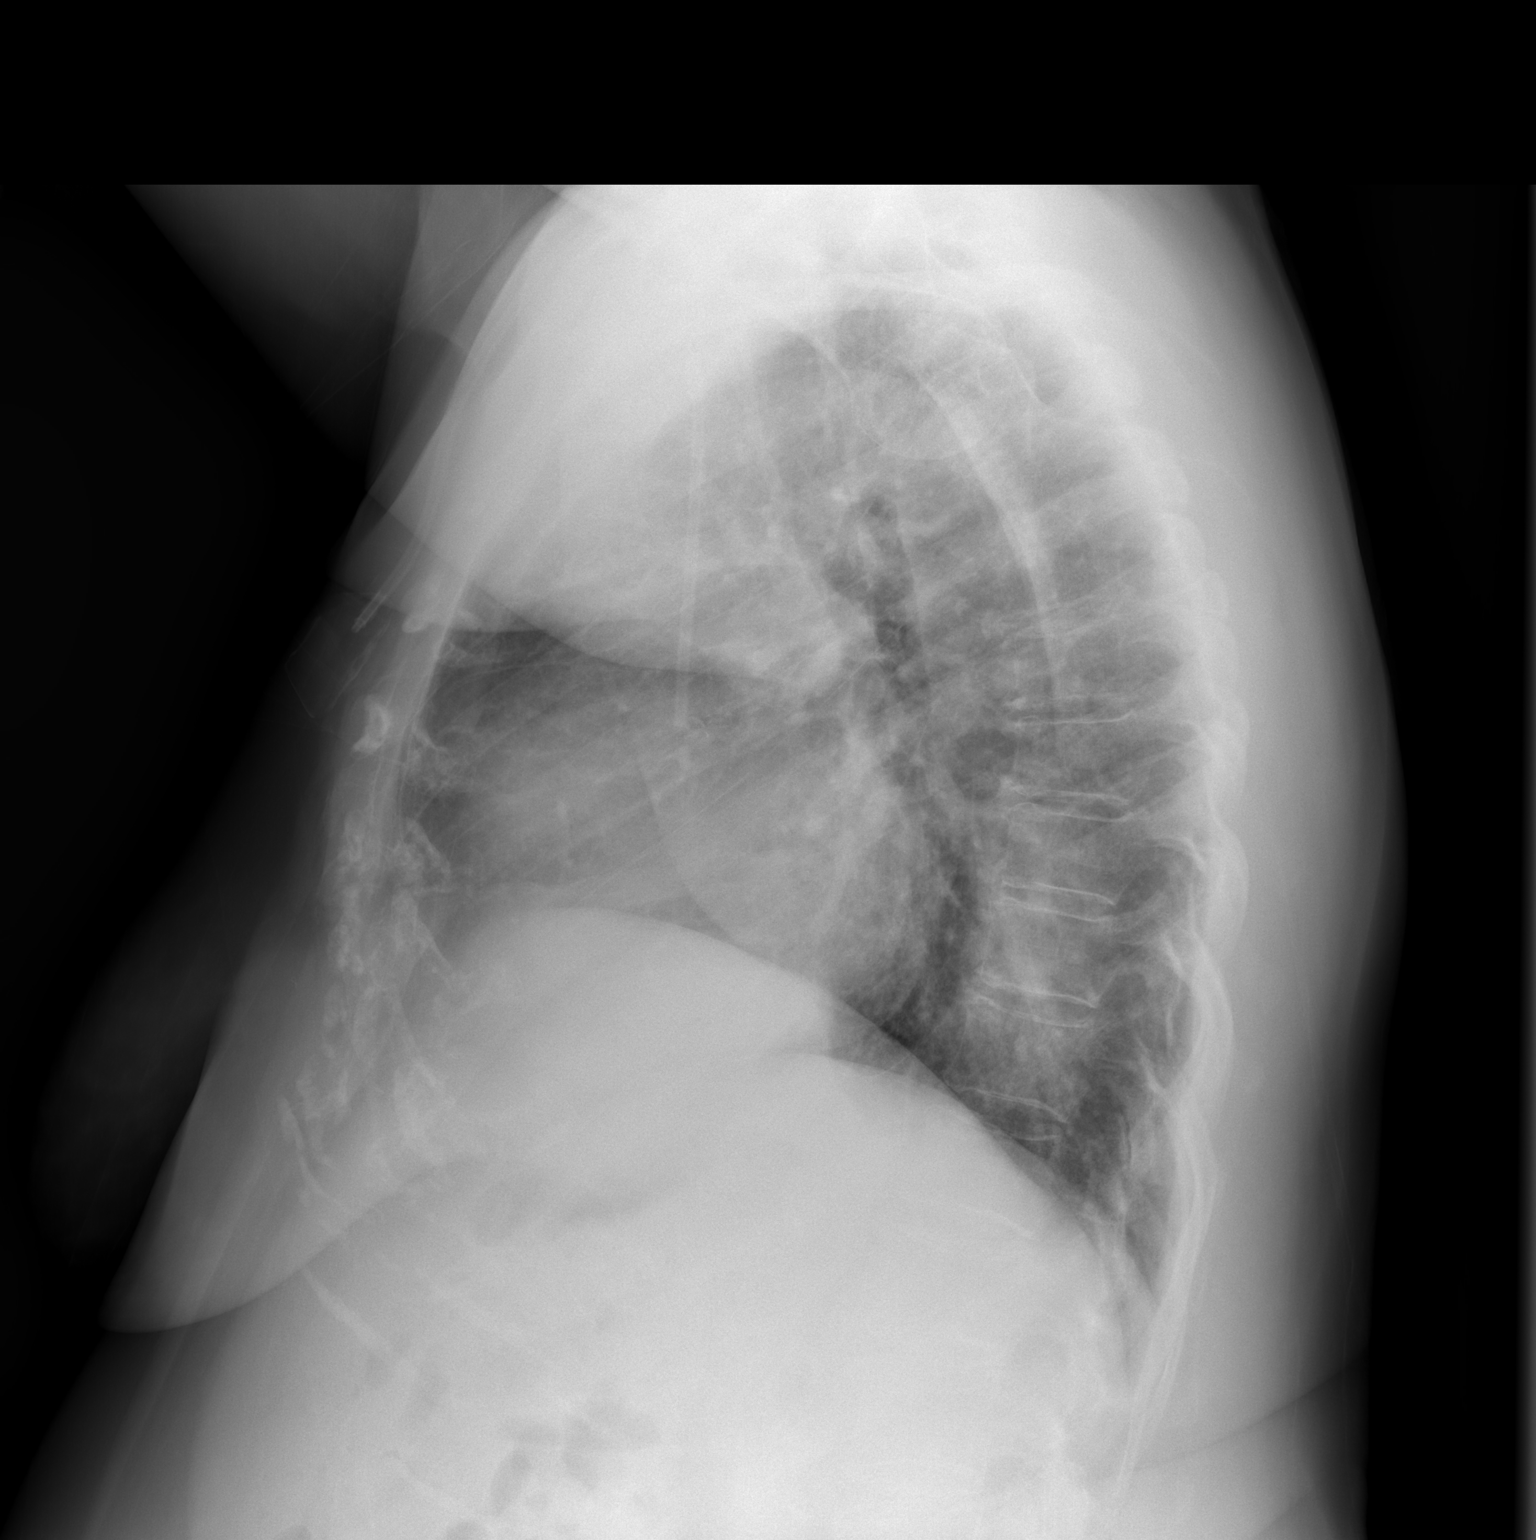

[2 of 2 positions shown; findings below may reference images not displayed]

FINDINGS: PowerPort catheter with tip over superior vena cava. Cardiomegaly.
Pulmonary venous congestion. Mild bilateral interstitial prominence.
No pleural effusion or pneumothorax.
IMPRESSION: 1.  PowerPort catheter with tip over superior vena cava.

2. Cardiomegaly with pulmonary venous congestion mild bilateral
interstitial prominence. Mild CHF cannot be excluded. Other
etiologies of interstitial prominence including pneumonitis cannot
be excluded.

## 2020-02-29 ENCOUNTER — Other Ambulatory Visit: Payer: Self-pay | Admitting: Hematology and Oncology

## 2020-02-29 DIAGNOSIS — Z170 Estrogen receptor positive status [ER+]: Secondary | ICD-10-CM

## 2020-02-29 DIAGNOSIS — C50411 Malignant neoplasm of upper-outer quadrant of right female breast: Secondary | ICD-10-CM

## 2020-03-01 NOTE — Progress Notes (Signed)
Pharmacist Chemotherapy Monitoring - Follow Up Assessment    I verify that I have reviewed each item in the below checklist:  . Regimen for the patient is scheduled for the appropriate day and plan matches scheduled date. Marland Kitchen Appropriate non-routine labs are ordered dependent on drug ordered. . If applicable, additional medications reviewed and ordered per protocol based on lifetime cumulative doses and/or treatment regimen.   Plan for follow-up and/or issues identified: No . I-vent associated with next due treatment: No . MD and/or nursing notified: No  Alexza Norbeck D 03/01/2020 2:21 PM

## 2020-03-02 ENCOUNTER — Other Ambulatory Visit: Payer: Self-pay | Admitting: Hematology and Oncology

## 2020-03-02 DIAGNOSIS — Z170 Estrogen receptor positive status [ER+]: Secondary | ICD-10-CM

## 2020-03-02 DIAGNOSIS — C50411 Malignant neoplasm of upper-outer quadrant of right female breast: Secondary | ICD-10-CM

## 2020-03-06 NOTE — Progress Notes (Signed)
Patient Care Team: Earney Mallet, MD as PCP - General (Family Medicine) [name], RN as Oncology Nurse Navigator Rockwell Germany, RN as Oncology Nurse Navigator [name], MD as Consulting Physician (Hematology and Oncology) [name], MD as Consulting Physician (General Surgery) [name], MD as Attending Physician (Radiation Oncology)  DIAGNOSIS:    ICD-10-CM   1. Malignant neoplasm of upper-outer quadrant of right breast in female, estrogen receptor positive ([place])  C50.411    Z17.0     SUMMARY OF ONCOLOGIC HISTORY: Oncology History  Malignant neoplasm of upper-outer quadrant of right breast in female, estrogen receptor positive ([place])  1992 Miscellaneous   Left mastectomy followed by adjuvant chemotherapy   05/22/2019 Initial Diagnosis   Patient palpated a lump in the right breast. Mammogram showed 2.2cm mass in the UOQ, with no right axillary adenopathy. 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 The Common Hereditary Cancers Panel offered by [facility] includes sequencing and/or deletion duplication testing of the following 48 genes: APC, ATM, AXIN2, BARD1, BMPR1A, BRCA1, BRCA2, BRIP1, CDH1, CDKN2A (p14ARF), CDKN2A (p16INK4a), CKD4, CHEK2, CTNNA1, DICER1, EPCAM (Deletion/duplication testing only), GREM1 (promoter region deletion/duplication testing only), KIT, MEN1, MLH1, MSH2, MSH3, MSH6, MUTYH, NBN, NF1, NHTL1, PALB2, PDGFRA, PMS2, POLD1, POLE, PTEN, RAD50, RAD51C, RAD51D, RNF43, SDHB, SDHC, SDHD, SMAD4, SMARCA4. STK11, TP53, TSC1,  TSC2, and VHL.  The following genes were evaluated for sequence changes only: SDHA and HOXB13 c.251G>A variant only. The report date is 06/18/2019.   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   07/17/2019 Cancer Staging   Staging form: Breast, AJCC 8th Edition - Pathologic: Stage IIA (pT2, pN0, cM0, G3, ER+, PR-, HER2+) - Signed by [name], MD on 07/17/2019   08/10/2019 - 12/13/2019 Chemotherapy   The patient had palonosetron (ALOXI) injection 0.25 mg, 0.25 mg, Intravenous,  Once, 6 of 6 cycles Administration: 0.25 mg (08/10/2019), 0.25 mg (08/31/2019), 0.25 mg (11/02/2019), 0.25 mg (11/23/2019), 0.25 mg (09/21/2019), 0.25 mg (10/12/2019) pegfilgrastim-jmdb (FULPHILA) injection 6 mg, 6 mg, Subcutaneous,  Once, 6 of 6 cycles Administration: 6 mg (08/12/2019), 6 mg (09/02/2019), 6 mg (11/04/2019), 6 mg (11/25/2019), 6 mg (09/23/2019), 6 mg (10/14/2019) CARBOplatin (PARAPLATIN) 600 mg in sodium chloride 0.9 % 250 mL chemo infusion, 600 mg (99.3 % of original dose 604.2 mg), Intravenous,  Once, 6 of 6 cycles Dose modification:   (original dose 604.2 mg, Cycle 1), 600 mg (original dose 604.2 mg, Cycle 1),   (original dose 604.2 mg, Cycle 2, Reason: Provider Judgment) Administration: 600 mg (08/10/2019), 500 mg (08/31/2019), 500 mg (11/02/2019), 500 mg (11/23/2019), 500 mg (09/21/2019), 500 mg (10/12/2019) DOCEtaxel (TAXOTERE) 140 mg in sodium chloride 0.9 % 250 mL chemo infusion, 65 mg/m2 = 140 mg (86.7 % of original dose 75 mg/m2), Intravenous,  Once, 6 of 6 cycles Dose modification: 65 mg/m2 (original dose 75 mg/m2, Cycle 1, Reason: Provider Judgment), 50 mg/m2 (original dose 75 mg/m2, Cycle 2, Reason: Provider Judgment), 60 mg/m2 (original dose 75 mg/m2, Cycle 2, Reason:  Dose not tolerated) Administration: 140 mg (08/10/2019), 130 mg (08/31/2019), 130 mg (11/02/2019), 130 mg (11/23/2019), 130 mg  (09/21/2019), 130 mg (10/12/2019) pertuzumab (PERJETA) 420 mg in sodium chloride 0.9 % 250 mL chemo infusion, 420 mg (50 % of original dose 840 mg), Intravenous, Once, 6 of 6 cycles Dose modification: 420 mg (original dose 840 mg, Cycle 1, Reason: Provider Judgment) Administration: 420 mg (08/10/2019), 420 mg (08/31/2019), 420 mg (11/02/2019), 420 mg (11/23/2019), 420 mg (09/21/2019), 420 mg (10/12/2019) fosaprepitant (EMEND) 150 mg, dexamethasone (DECADRON) 12 mg in sodium chloride 0.9 % 145 mL IVPB, , Intravenous,  Once, 6 of 6 cycles Administration:  (08/10/2019),  (08/31/2019),  (11/02/2019),  (11/23/2019),  (09/21/2019),  (10/12/2019) trastuzumab-dkst (OGIVRI) 750 mg in sodium chloride 0.9 % 250 mL chemo infusion, 714 mg, Intravenous,  Once, 6 of 6 cycles Administration: 750 mg (08/10/2019), 600 mg (08/31/2019), 546 mg (11/02/2019), 546 mg (11/23/2019), 600 mg (09/21/2019), 546 mg (10/12/2019)  for chemotherapy treatment.    12/14/2019 -  Chemotherapy   The patient had pertuzumab (PERJETA) 420 mg in sodium chloride 0.9 % 250 mL chemo infusion, 420 mg, Intravenous, Once, 4 of 13 cycles Administration: 420 mg (12/14/2019), 420 mg (01/04/2020), 420 mg (01/25/2020), 420 mg (02/15/2020) trastuzumab-dkst (OGIVRI) 546 mg in sodium chloride 0.9 % 250 mL chemo infusion, 6 mg/kg = 546 mg, Intravenous,  Once, 4 of 13 cycles Administration: 546 mg (12/14/2019), 546 mg (01/04/2020), 546 mg (01/25/2020), 546 mg (02/15/2020)  for chemotherapy treatment.      CHIEF COMPLIANT: Follow-up of Herceptin Perjeta maintenance  INTERVAL HISTORY: Marisa Spears is a 69 y.o. with above-mentioned history of HER-2 positive right breast cancerwho underwent a mastectomy, completedadjuvant chemotherapy, and is currently on Herceptin Perjeta maintenance.She presents to the clinic todayfortreatment.   She continues to suffer from neuropathy symptoms in her feet which cause discomfort if she stays on her feet for long time.  Fingertips are also  slightly sore and the nailbeds are uncomfortable.  The nails appear to be slightly brittle.  Diarrhea is under good control.  ALLERGIES:  is allergic to codeine.  MEDICATIONS:  Current Outpatient Medications  Medication Sig Dispense Refill  . anastrozole (ARIMIDEX) 1 MG tablet Take 1 tablet (1 mg total) by mouth daily. (Patient not taking: Reported on 11/23/2019) 90 tablet 3  . aspirin EC 81 MG tablet Take 81 mg by mouth at bedtime.     . Biotin w/ Vitamins C & E (HAIR/SKIN/NAILS PO) Take 2 tablets by mouth daily.    . Calcium Carb-Cholecalciferol (CALCIUM 600+D3 PO) Take 1 tablet by mouth daily.    . Cholecalciferol (EQL VITAMIN D3) 50 MCG (2000 UT) CAPS Take 4,000 Units by mouth daily.    . diphenoxylate-atropine (LOMOTIL) 2.5-0.025 MG tablet Take 1 tablet by mouth 2 (two) times daily. 60 tablet 3  . gabapentin (NEURONTIN) 100 MG capsule Take 1 capsule (100 mg total) by mouth at bedtime. 30 capsule 3  . LORazepam (ATIVAN) 0.5 MG tablet TAKE 1 TABLET BY MOUTH AT BEDTIME AS NEEDED FOR SLEEP 30 tablet 0  . methocarbamol (ROBAXIN) 500 MG tablet Take 1 tablet (500 mg total) by mouth every 6 (six) hours as needed for muscle spasms. (Patient not taking: Reported on 11/23/2019) 30 tablet 1  . Omega-3 Fatty Acids (FISH OIL ULTRA) 1400 MG CAPS Take 1,400 mg by mouth 2 (two) times daily.    . pravastatin (PRAVACHOL) 40 MG tablet Take 40 mg by mouth at bedtime.     . traMADol (ULTRAM) 50 MG  tablet Take 1 tablet (50 mg total) by mouth every 6 (six) hours as needed (mild pain). (Patient not taking: Reported on 11/23/2019) 30 tablet 0  . triamterene-hydrochlorothiazide (MAXZIDE-25) 37.5-25 MG tablet Take 1 tablet by mouth daily.     Marland Kitchen venlafaxine XR (EFFEXOR-XR) 37.5 MG 24 hr capsule TAKE 1 CAPSULE BY MOUTH ONCE DAILY WITH  BREAKFAST 30 capsule 0   No current facility-administered medications for this visit.    PHYSICAL EXAMINATION: ECOG PERFORMANCE STATUS: 2 - Symptomatic, <50% confined to  bed  Vitals:   03/07/20 0843  BP: (!) 142/106  Pulse: 69  Resp: 18  Temp: 98.3 F (36.8 C)  SpO2: 98%   Filed Weights   03/07/20 0843  Weight: 200 lb 3.2 oz (90.8 kg)     LABORATORY DATA:  I have reviewed the data as listed CMP Latest Ref Rng & Units 01/25/2020 12/14/2019 11/23/2019  Glucose 70 - 99 mg/dL 103(H) 118(H) 139(H)  BUN 8 - 23 mg/dL 10 7(L) 8  Creatinine 0.44 - 1.00 mg/dL 0.71 0.69 0.72  Sodium 135 - 145 mmol/L 141 141 140  Potassium 3.5 - 5.1 mmol/L 4.0 3.4(L) 3.4(L)  Chloride 98 - 111 mmol/L 104 106 103  CO2 22 - 32 mmol/L '29 25 25  '$ Calcium 8.9 - 10.3 mg/dL 9.3 8.3(L) 8.5(L)  Total Protein 6.5 - 8.1 g/dL 7.2 6.4(L) 6.5  Total Bilirubin 0.3 - 1.2 mg/dL 0.5 0.5 0.5  Alkaline Phos 38 - 126 U/L 61 57 64  AST 15 - 41 U/L 15 14(L) 16  ALT 0 - 44 U/L '10 8 9    '$ Lab Results  Component Value Date   WBC 5.2 03/07/2020   HGB 13.1 03/07/2020   HCT 38.0 03/07/2020   MCV 91.1 03/07/2020   PLT 200 03/07/2020   NEUTROABS 3.5 03/07/2020    ASSESSMENT & PLAN:  Malignant neoplasm of upper-outer quadrant of right breast in female, estrogen receptor positive ([place]) 05/22/2019:Patient palpated a lump in the right breast. Mammogram showed 2.2cm mass in the UOQ, with no right axillary adenopathy. 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 Stage 2A 2.Adjuvant chemotherapy with [place] Perjetax6 started 9/14/2020completed 12/28/2020followed by Herceptin Perjeta maintenance 3.Adjuvant antiestrogen therapy with anastrozole 1 mg daily x7 years [id number]: Taxane neuropathy study: No adverse effects due to participation in the  study -------------------------------------------------------------------------------------------------------------------------- Treatment plan: Herceptin Perjeta maintenancealong with anastrozole (to be started 12/14/2019), Herceptin Perjeta will be completed 08/01/2020. Toxicities: 1.Diarrhea:  Marked improvement 2.Anxiety and depression: On Effexor 3.  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    Watchful monitoring. Previous anemia: Resolved  Return to clinic every 3 for Herceptin Perjeta maintenance every 6 weeks for follow-up with me.     No orders of the defined types were placed in this encounter.  The patient has a good understanding of the overall plan. she agrees with it. she will call with any problems that may develop before the next visit here.  Total time spent: 30 mins including face to face time and time spent for planning, charting and coordination of care  [name], MD 03/07/2020  I, Cloyde Reams Dorshimer, am acting as scribe for Dr. [name].  I have reviewed the above documentation for accuracy and completeness, and  I agree with the above.       

## 2020-03-07 ENCOUNTER — Inpatient Hospital Stay: Payer: Medicare Other | Attending: Hematology and Oncology

## 2020-03-07 ENCOUNTER — Encounter: Payer: Self-pay | Admitting: *Deleted

## 2020-03-07 ENCOUNTER — Inpatient Hospital Stay: Payer: Medicare Other

## 2020-03-07 ENCOUNTER — Inpatient Hospital Stay (HOSPITAL_BASED_OUTPATIENT_CLINIC_OR_DEPARTMENT_OTHER): Payer: Medicare Other | Admitting: Hematology and Oncology

## 2020-03-07 ENCOUNTER — Other Ambulatory Visit: Payer: Self-pay

## 2020-03-07 VITALS — BP 145/92 | HR 64 | Temp 98.2°F | Resp 18

## 2020-03-07 DIAGNOSIS — Z170 Estrogen receptor positive status [ER+]: Secondary | ICD-10-CM | POA: Diagnosis not present

## 2020-03-07 DIAGNOSIS — Z5112 Encounter for antineoplastic immunotherapy: Secondary | ICD-10-CM | POA: Diagnosis present

## 2020-03-07 DIAGNOSIS — C50411 Malignant neoplasm of upper-outer quadrant of right female breast: Secondary | ICD-10-CM | POA: Diagnosis present

## 2020-03-07 DIAGNOSIS — Z79899 Other long term (current) drug therapy: Secondary | ICD-10-CM | POA: Diagnosis not present

## 2020-03-07 DIAGNOSIS — Z95828 Presence of other vascular implants and grafts: Secondary | ICD-10-CM

## 2020-03-07 LAB — CBC WITH DIFFERENTIAL (CANCER CENTER ONLY)
Abs Immature Granulocytes: 0.01 10*3/uL (ref 0.00–0.07)
Basophils Absolute: 0 10*3/uL (ref 0.0–0.1)
Basophils Relative: 1 %
Eosinophils Absolute: 0.2 10*3/uL (ref 0.0–0.5)
Eosinophils Relative: 3 %
HCT: 38 % (ref 36.0–46.0)
Hemoglobin: 13.1 g/dL (ref 12.0–15.0)
Immature Granulocytes: 0 %
Lymphocytes Relative: 24 %
Lymphs Abs: 1.3 10*3/uL (ref 0.7–4.0)
MCH: 31.4 pg (ref 26.0–34.0)
MCHC: 34.5 g/dL (ref 30.0–36.0)
MCV: 91.1 fL (ref 80.0–100.0)
Monocytes Absolute: 0.3 10*3/uL (ref 0.1–1.0)
Monocytes Relative: 6 %
Neutro Abs: 3.5 10*3/uL (ref 1.7–7.7)
Neutrophils Relative %: 66 %
Platelet Count: 200 10*3/uL (ref 150–400)
RBC: 4.17 MIL/uL (ref 3.87–5.11)
RDW: 12.1 % (ref 11.5–15.5)
WBC Count: 5.2 10*3/uL (ref 4.0–10.5)
nRBC: 0 % (ref 0.0–0.2)

## 2020-03-07 LAB — CMP (CANCER CENTER ONLY)
ALT: 13 U/L (ref 0–44)
AST: 16 U/L (ref 15–41)
Albumin: 4.1 g/dL (ref 3.5–5.0)
Alkaline Phosphatase: 75 U/L (ref 38–126)
Anion gap: 12 (ref 5–15)
BUN: 10 mg/dL (ref 8–23)
CO2: 25 mmol/L (ref 22–32)
Calcium: 9.5 mg/dL (ref 8.9–10.3)
Chloride: 103 mmol/L (ref 98–111)
Creatinine: 0.83 mg/dL (ref 0.44–1.00)
GFR, Est AFR Am: >60 mL/min (ref >60)
GFR, Estimated: >60 mL/min (ref >60)
Glucose, Bld: 172 mg/dL — ABNORMAL HIGH (ref 70–99)
Potassium: 3.6 mmol/L (ref 3.5–5.1)
Sodium: 140 mmol/L (ref 135–145)
Total Bilirubin: 0.6 mg/dL (ref 0.3–1.2)
Total Protein: 7.6 g/dL (ref 6.5–8.1)

## 2020-03-07 MED ORDER — ACETAMINOPHEN 325 MG PO TABS
ORAL_TABLET | ORAL | Status: AC
  Filled 2020-03-07: qty 2

## 2020-03-07 MED ORDER — TRASTUZUMAB-DKST CHEMO 150 MG IV SOLR
6.0000 mg/kg | Freq: Once | INTRAVENOUS | Status: AC
  Administered 2020-03-07: 10:00:00 546 mg via INTRAVENOUS
  Filled 2020-03-07: qty 26

## 2020-03-07 MED ORDER — SODIUM CHLORIDE 0.9 % IV SOLN
420.0000 mg | Freq: Once | INTRAVENOUS | Status: AC
  Administered 2020-03-07: 11:00:00 420 mg via INTRAVENOUS
  Filled 2020-03-07: qty 14

## 2020-03-07 MED ORDER — ACETAMINOPHEN 325 MG PO TABS
650.0000 mg | ORAL_TABLET | Freq: Once | ORAL | Status: AC
  Administered 2020-03-07: 10:00:00 650 mg via ORAL

## 2020-03-07 MED ORDER — DIPHENHYDRAMINE HCL 25 MG PO CAPS
ORAL_CAPSULE | ORAL | Status: AC
  Filled 2020-03-07: qty 1

## 2020-03-07 MED ORDER — SODIUM CHLORIDE 0.9% FLUSH
10.0000 mL | INTRAVENOUS | Status: DC | PRN
  Administered 2020-03-07: 08:00:00 10 mL
  Filled 2020-03-07: qty 10

## 2020-03-07 MED ORDER — DIPHENHYDRAMINE HCL 25 MG PO CAPS
25.0000 mg | ORAL_CAPSULE | Freq: Once | ORAL | Status: AC
  Administered 2020-03-07: 10:00:00 25 mg via ORAL

## 2020-03-07 MED ORDER — HEPARIN SOD (PORK) LOCK FLUSH 100 UNIT/ML IV SOLN
500.0000 [IU] | Freq: Once | INTRAVENOUS | Status: AC | PRN
  Administered 2020-03-07: 12:00:00 500 [IU]
  Filled 2020-03-07: qty 5

## 2020-03-07 MED ORDER — SODIUM CHLORIDE 0.9% FLUSH
10.0000 mL | INTRAVENOUS | Status: DC | PRN
  Administered 2020-03-07: 12:00:00 10 mL
  Filled 2020-03-07: qty 10

## 2020-03-07 MED ORDER — SODIUM CHLORIDE 0.9 % IV SOLN
Freq: Once | INTRAVENOUS | Status: AC
  Filled 2020-03-07: qty 250

## 2020-03-07 NOTE — Assessment & Plan Note (Signed)
05/22/2019:Patient palpated a lump in the right breast. Mammogram showed 2.2cm mass in the UOQ, with no right axillary adenopathy. 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 Stage 2A 2.Adjuvant chemotherapy with [place] Perjetax6 started 9/14/2020completed 12/28/2020followed by Herceptin Perjeta maintenance 3.Adjuvant antiestrogen therapy with anastrozole 1 mg daily x7 years [id number]: Taxane neuropathy study: No adverse effects due to participation in the study -------------------------------------------------------------------------------------------------------------------------- Treatment plan: Herceptin Perjeta maintenancealong with anastrozole (to be started 12/14/2019) Toxicities: 1.Diarrhea: On as needed Imodium.  Much better 2.Anxiety and depression: On Effexor 3.  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    Return to clinic every 3 for Herceptin Perjeta maintenance every 6 weeks for follow-up with me.

## 2020-03-07 NOTE — Patient Instructions (Signed)
[facility] Discharge Instructions for Patients Receiving Chemotherapy  Today you received the following chemotherapy agents: Trastuzumab, Pertuzumab  To help prevent nausea and vomiting after your treatment, we encourage you to take your nausea medication as directed.   If you develop nausea and vomiting that is not controlled by your nausea medication, call the clinic.   BELOW ARE SYMPTOMS THAT SHOULD BE REPORTED IMMEDIATELY:  *FEVER GREATER THAN 100.5 F  *CHILLS WITH OR WITHOUT FEVER  NAUSEA AND VOMITING THAT IS NOT CONTROLLED WITH YOUR NAUSEA MEDICATION  *UNUSUAL SHORTNESS OF BREATH  *UNUSUAL BRUISING OR BLEEDING  TENDERNESS IN MOUTH AND THROAT WITH OR WITHOUT PRESENCE OF ULCERS  *URINARY PROBLEMS  *BOWEL PROBLEMS  UNUSUAL RASH Items with * indicate a potential emergency and should be followed up as soon as possible.  Feel free to call the clinic should you have any questions or concerns. The clinic phone number is [phone].  Please show the CHEMO ALERT CARD at check-in to the Emergency Department and triage nurse.   

## 2020-03-07 NOTE — Patient Instructions (Signed)

## 2020-03-09 ENCOUNTER — Encounter: Payer: Self-pay | Admitting: *Deleted

## 2020-03-15 ENCOUNTER — Telehealth: Payer: Self-pay | Admitting: Hematology and Oncology

## 2020-03-15 NOTE — Telephone Encounter (Signed)
Scheduled per los, patient has been called regarding upcoming scheduled appointments.

## 2020-03-22 ENCOUNTER — Other Ambulatory Visit: Payer: Self-pay

## 2020-03-22 DIAGNOSIS — Z170 Estrogen receptor positive status [ER+]: Secondary | ICD-10-CM

## 2020-03-22 DIAGNOSIS — C50411 Malignant neoplasm of upper-outer quadrant of right female breast: Secondary | ICD-10-CM

## 2020-03-22 NOTE — Progress Notes (Signed)
Pharmacist Chemotherapy Monitoring - Follow Up Assessment    I verify that I have reviewed each item in the below checklist:  . Regimen for the patient is scheduled for the appropriate day and plan matches scheduled date. Marland Kitchen Appropriate non-routine labs are ordered dependent on drug ordered. . If applicable, additional medications reviewed and ordered per protocol based on lifetime cumulative doses and/or treatment regimen.   Plan for follow-up and/or issues identified: Yes . I-vent associated with next due treatment: Yes . MD and/or nursing notified: Yes   Kennith Center, Pharm.D., CPP 03/22/2020@2 :50 PM

## 2020-03-23 ENCOUNTER — Other Ambulatory Visit: Payer: Self-pay

## 2020-03-23 MED ORDER — GABAPENTIN 100 MG PO CAPS: 100.0000 mg | ORAL_CAPSULE | Freq: Every day | ORAL | 0 refills | Status: DC

## 2020-03-28 ENCOUNTER — Inpatient Hospital Stay: Payer: Medicare Other | Attending: Hematology and Oncology

## 2020-03-28 ENCOUNTER — Other Ambulatory Visit: Payer: Self-pay

## 2020-03-28 VITALS — BP 133/91 | HR 61 | Temp 98.3°F | Resp 18 | Ht 69.0 in | Wt 199.5 lb

## 2020-03-28 DIAGNOSIS — Z79899 Other long term (current) drug therapy: Secondary | ICD-10-CM | POA: Insufficient documentation

## 2020-03-28 DIAGNOSIS — Z5112 Encounter for antineoplastic immunotherapy: Secondary | ICD-10-CM | POA: Diagnosis not present

## 2020-03-28 DIAGNOSIS — Z170 Estrogen receptor positive status [ER+]: Secondary | ICD-10-CM | POA: Insufficient documentation

## 2020-03-28 DIAGNOSIS — C50411 Malignant neoplasm of upper-outer quadrant of right female breast: Secondary | ICD-10-CM | POA: Insufficient documentation

## 2020-03-28 MED ORDER — DIPHENHYDRAMINE HCL 25 MG PO CAPS
ORAL_CAPSULE | ORAL | Status: AC
  Filled 2020-03-28: qty 1

## 2020-03-28 MED ORDER — SODIUM CHLORIDE 0.9 % IV SOLN
420.0000 mg | Freq: Once | INTRAVENOUS | Status: AC
  Administered 2020-03-28: 420 mg via INTRAVENOUS
  Filled 2020-03-28: qty 14

## 2020-03-28 MED ORDER — SODIUM CHLORIDE 0.9 % IV SOLN
Freq: Once | INTRAVENOUS | Status: AC
  Filled 2020-03-28: qty 250

## 2020-03-28 MED ORDER — ACETAMINOPHEN 325 MG PO TABS
ORAL_TABLET | ORAL | Status: AC
  Filled 2020-03-28: qty 2

## 2020-03-28 MED ORDER — SODIUM CHLORIDE 0.9% FLUSH
10.0000 mL | INTRAVENOUS | Status: DC | PRN
  Administered 2020-03-28: 10 mL
  Filled 2020-03-28: qty 10

## 2020-03-28 MED ORDER — DIPHENHYDRAMINE HCL 25 MG PO CAPS
25.0000 mg | ORAL_CAPSULE | Freq: Once | ORAL | Status: AC
  Administered 2020-03-28: 25 mg via ORAL

## 2020-03-28 MED ORDER — ACETAMINOPHEN 325 MG PO TABS
650.0000 mg | ORAL_TABLET | Freq: Once | ORAL | Status: AC
  Administered 2020-03-28: 650 mg via ORAL

## 2020-03-28 MED ORDER — TRASTUZUMAB-DKST CHEMO 150 MG IV SOLR
6.0000 mg/kg | Freq: Once | INTRAVENOUS | Status: AC
  Administered 2020-03-28: 546 mg via INTRAVENOUS
  Filled 2020-03-28: qty 26

## 2020-03-28 MED ORDER — HEPARIN SOD (PORK) LOCK FLUSH 100 UNIT/ML IV SOLN
500.0000 [IU] | Freq: Once | INTRAVENOUS | Status: AC | PRN
  Administered 2020-03-28: 500 [IU]
  Filled 2020-03-28: qty 5

## 2020-03-28 NOTE — Patient Instructions (Signed)
[facility] Discharge Instructions for Patients Receiving Chemotherapy  Today you received the following chemotherapy agents Trastuzumab-dkst and Perjeta  To help prevent nausea and vomiting after your treatment, we encourage you to take your nausea medication as directed.    If you develop nausea and vomiting that is not controlled by your nausea medication, call the clinic.   BELOW ARE SYMPTOMS THAT SHOULD BE REPORTED IMMEDIATELY:  *FEVER GREATER THAN 100.5 F  *CHILLS WITH OR WITHOUT FEVER  NAUSEA AND VOMITING THAT IS NOT CONTROLLED WITH YOUR NAUSEA MEDICATION  *UNUSUAL SHORTNESS OF BREATH  *UNUSUAL BRUISING OR BLEEDING  TENDERNESS IN MOUTH AND THROAT WITH OR WITHOUT PRESENCE OF ULCERS  *URINARY PROBLEMS  *BOWEL PROBLEMS  UNUSUAL RASH Items with * indicate a potential emergency and should be followed up as soon as possible.  Feel free to call the clinic should you have any questions or concerns. The clinic phone number is [phone].  Please show the CHEMO ALERT CARD at check-in to the Emergency Department and triage nurse.   

## 2020-03-29 ENCOUNTER — Other Ambulatory Visit: Payer: Self-pay | Admitting: Hematology and Oncology

## 2020-03-29 ENCOUNTER — Other Ambulatory Visit (HOSPITAL_COMMUNITY): Payer: Medicare Other

## 2020-03-29 DIAGNOSIS — Z170 Estrogen receptor positive status [ER+]: Secondary | ICD-10-CM

## 2020-04-04 ENCOUNTER — Other Ambulatory Visit (HOSPITAL_COMMUNITY): Payer: Medicare Other

## 2020-04-04 ENCOUNTER — Other Ambulatory Visit: Payer: Self-pay | Admitting: Hematology and Oncology

## 2020-04-06 ENCOUNTER — Ambulatory Visit (HOSPITAL_COMMUNITY)
Admission: RE | Admit: 2020-04-06 | Discharge: 2020-04-06 | Disposition: A | Discharge status: Home / Self Care | Payer: Medicare Other | Source: Ambulatory Visit | Attending: Hematology and Oncology | Admitting: Hematology and Oncology

## 2020-04-06 ENCOUNTER — Other Ambulatory Visit: Payer: Self-pay

## 2020-04-06 DIAGNOSIS — Z170 Estrogen receptor positive status [ER+]: Secondary | ICD-10-CM

## 2020-04-06 DIAGNOSIS — I358 Other nonrheumatic aortic valve disorders: Secondary | ICD-10-CM | POA: Diagnosis not present

## 2020-04-06 DIAGNOSIS — I10 Essential (primary) hypertension: Secondary | ICD-10-CM | POA: Insufficient documentation

## 2020-04-06 DIAGNOSIS — Z01818 Encounter for other preprocedural examination: Secondary | ICD-10-CM | POA: Insufficient documentation

## 2020-04-06 DIAGNOSIS — C50411 Malignant neoplasm of upper-outer quadrant of right female breast: Secondary | ICD-10-CM | POA: Diagnosis not present

## 2020-04-06 NOTE — Progress Notes (Signed)
Echocardiogram 2D Echocardiogram has been performed.  Oneal Deputy Samael Blades 04/06/2020, 9:41 AM

## 2020-04-12 NOTE — Progress Notes (Signed)
Pharmacist Chemotherapy Monitoring - Follow Up Assessment    I verify that I have reviewed each item in the below checklist:  . Regimen for the patient is scheduled for the appropriate day and plan matches scheduled date. Marland Kitchen Appropriate non-routine labs are ordered dependent on drug ordered. . If applicable, additional medications reviewed and ordered per protocol based on lifetime cumulative doses and/or treatment regimen.   Plan for follow-up and/or issues identified: No . I-vent associated with next due treatment: No . MD and/or nursing notified: No}  Marisa Spears 04/12/2020 10:16 AM

## 2020-04-17 NOTE — Progress Notes (Signed)
Patient Care Team: Earney Mallet, MD as PCP - General (Family Medicine) [name], RN as Oncology Nurse Navigator Rockwell Germany, RN as Oncology Nurse Navigator [name], MD as Consulting Physician (Hematology and Oncology) [name], MD as Consulting Physician (General Surgery) [name], MD as Attending Physician (Radiation Oncology)  DIAGNOSIS:    ICD-10-CM   1. Malignant neoplasm of upper-outer quadrant of right breast in female, estrogen receptor positive ([place])  C50.411    Z17.0     SUMMARY OF ONCOLOGIC HISTORY: Oncology History  Malignant neoplasm of upper-outer quadrant of right breast in female, estrogen receptor positive ([place])  1992 Miscellaneous   Left mastectomy followed by adjuvant chemotherapy   05/22/2019 Initial Diagnosis   Patient palpated a lump in the right breast. Mammogram showed 2.2cm mass in the UOQ, with no right axillary adenopathy. 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 The Common Hereditary Cancers Panel offered by [facility] includes sequencing and/or deletion duplication testing of the following 48 genes: APC, ATM, AXIN2, BARD1, BMPR1A, BRCA1, BRCA2, BRIP1, CDH1, CDKN2A (p14ARF), CDKN2A (p16INK4a), CKD4, CHEK2, CTNNA1, DICER1, EPCAM (Deletion/duplication testing only), GREM1 (promoter region deletion/duplication testing only), KIT, MEN1, MLH1, MSH2, MSH3, MSH6, MUTYH, NBN, NF1, NHTL1, PALB2, PDGFRA, PMS2, POLD1, POLE, PTEN, RAD50, RAD51C, RAD51D, RNF43, SDHB, SDHC, SDHD, SMAD4, SMARCA4. STK11, TP53, TSC1,  TSC2, and VHL.  The following genes were evaluated for sequence changes only: SDHA and HOXB13 c.251G>A variant only. The report date is 06/18/2019.   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   07/17/2019 Cancer Staging   Staging form: Breast, AJCC 8th Edition - Pathologic: Stage IIA (pT2, pN0, cM0, G3, ER+, PR-, HER2+) - Signed by [name], MD on 07/17/2019   08/10/2019 - 12/13/2019 Chemotherapy   The patient had palonosetron (ALOXI) injection 0.25 mg, 0.25 mg, Intravenous,  Once, 6 of 6 cycles Administration: 0.25 mg (08/10/2019), 0.25 mg (08/31/2019), 0.25 mg (11/02/2019), 0.25 mg (11/23/2019), 0.25 mg (09/21/2019), 0.25 mg (10/12/2019) pegfilgrastim-jmdb (FULPHILA) injection 6 mg, 6 mg, Subcutaneous,  Once, 6 of 6 cycles Administration: 6 mg (08/12/2019), 6 mg (09/02/2019), 6 mg (11/04/2019), 6 mg (11/25/2019), 6 mg (09/23/2019), 6 mg (10/14/2019) CARBOplatin (PARAPLATIN) 600 mg in sodium chloride 0.9 % 250 mL chemo infusion, 600 mg (99.3 % of original dose 604.2 mg), Intravenous,  Once, 6 of 6 cycles Dose modification:   (original dose 604.2 mg, Cycle 1), 600 mg (original dose 604.2 mg, Cycle 1),   (original dose 604.2 mg, Cycle 2, Reason: Provider Judgment) Administration: 600 mg (08/10/2019), 500 mg (08/31/2019), 500 mg (11/02/2019), 500 mg (11/23/2019), 500 mg (09/21/2019), 500 mg (10/12/2019) DOCEtaxel (TAXOTERE) 140 mg in sodium chloride 0.9 % 250 mL chemo infusion, 65 mg/m2 = 140 mg (86.7 % of original dose 75 mg/m2), Intravenous,  Once, 6 of 6 cycles Dose modification: 65 mg/m2 (original dose 75 mg/m2, Cycle 1, Reason: Provider Judgment), 50 mg/m2 (original dose 75 mg/m2, Cycle 2, Reason: Provider Judgment), 60 mg/m2 (original dose 75 mg/m2, Cycle 2, Reason:  Dose not tolerated) Administration: 140 mg (08/10/2019), 130 mg (08/31/2019), 130 mg (11/02/2019), 130 mg (11/23/2019), 130 mg  (09/21/2019), 130 mg (10/12/2019) pertuzumab (PERJETA) 420 mg in sodium chloride 0.9 % 250 mL chemo infusion, 420 mg (50 % of original dose 840 mg), Intravenous, Once, 6 of 6 cycles Dose modification: 420 mg (original dose 840 mg, Cycle 1, Reason: Provider Judgment) Administration: 420 mg (08/10/2019), 420 mg (08/31/2019), 420 mg (11/02/2019), 420 mg (11/23/2019), 420 mg (09/21/2019), 420 mg (10/12/2019) fosaprepitant (EMEND) 150 mg, dexamethasone (DECADRON) 12 mg in sodium chloride 0.9 % 145 mL IVPB, , Intravenous,  Once, 6 of 6 cycles Administration:  (08/10/2019),  (08/31/2019),  (11/02/2019),  (11/23/2019),  (09/21/2019),  (10/12/2019) trastuzumab-dkst (OGIVRI) 750 mg in sodium chloride 0.9 % 250 mL chemo infusion, 714 mg, Intravenous,  Once, 6 of 6 cycles Administration: 750 mg (08/10/2019), 600 mg (08/31/2019), 546 mg (11/02/2019), 546 mg (11/23/2019), 600 mg (09/21/2019), 546 mg (10/12/2019)  for chemotherapy treatment.    12/14/2019 -  Chemotherapy   The patient had pertuzumab (PERJETA) 420 mg in sodium chloride 0.9 % 250 mL chemo infusion, 420 mg, Intravenous, Once, 6 of 12 cycles Administration: 420 mg (12/14/2019), 420 mg (03/07/2020), 420 mg (03/28/2020), 420 mg (01/04/2020), 420 mg (01/25/2020), 420 mg (02/15/2020) trastuzumab-dkst (OGIVRI) 546 mg in sodium chloride 0.9 % 250 mL chemo infusion, 6 mg/kg = 546 mg, Intravenous,  Once, 6 of 12 cycles Administration: 546 mg (12/14/2019), 546 mg (03/07/2020), 546 mg (03/28/2020), 546 mg (01/04/2020), 546 mg (01/25/2020), 546 mg (02/15/2020)  for chemotherapy treatment.      CHIEF COMPLIANT: Follow-up of Herceptin Perjeta maintenance  INTERVAL HISTORY: Marisa Spears is a 69 y.o. with above-mentioned history of HER-2 positive right breast cancerwho underwent a mastectomy,completedadjuvant chemotherapy, andis currently on Herceptin Perjeta maintenance.Echo on 04/06/20 showed an ejection fraction of 60-65%. She presents to the clinic todayfortreatment.    She does  not have diarrhea anymore.  She has had UTI and sinusitis.  She is currently on antibiotics for her UTI has resolved but the sinus infection is still present.  She has 5 more days of antibiotic left.  ALLERGIES:  is allergic to codeine.  MEDICATIONS:  Current Outpatient Medications  Medication Sig Dispense Refill  . anastrozole (ARIMIDEX) 1 MG tablet Take 1 tablet (1 mg total) by mouth daily. (Patient not taking: Reported on 11/23/2019) 90 tablet 3  . aspirin EC 81 MG tablet Take 81 mg by mouth at bedtime.     . Biotin w/ Vitamins C & E (HAIR/SKIN/NAILS PO) Take 2 tablets by mouth daily.    . Calcium Carb-Cholecalciferol (CALCIUM 600+D3 PO) Take 1 tablet by mouth daily.    . Cholecalciferol (EQL VITAMIN D3) 50 MCG (2000 UT) CAPS Take 4,000 Units by mouth daily.    . diphenoxylate-atropine (LOMOTIL) 2.5-0.025 MG tablet Take 1 tablet by mouth 2 (two) times daily. 60 tablet 3  . gabapentin (NEURONTIN) 100 MG capsule Take 1 capsule (100 mg total) by mouth at bedtime. 90 capsule 0  . LORazepam (ATIVAN) 0.5 MG tablet TAKE 1 TABLET BY MOUTH AT BEDTIME AS NEEDED FOR SLEEP 30 tablet 0  . methocarbamol (ROBAXIN) 500 MG tablet Take 1 tablet (500 mg total) by mouth every 6 (six) hours as needed for muscle spasms. (Patient not taking: Reported on 11/23/2019) 30 tablet 1  . Omega-3 Fatty Acids (FISH OIL ULTRA) 1400 MG CAPS Take 1,400 mg by mouth 2 (two) times daily.    . pravastatin (PRAVACHOL) 40 MG tablet Take 40 mg  by mouth at bedtime.     . traMADol (ULTRAM) 50 MG tablet Take 1 tablet (50 mg total) by mouth every 6 (six) hours as needed (mild pain). (Patient not taking: Reported on 11/23/2019) 30 tablet 0  . triamterene-hydrochlorothiazide (MAXZIDE-25) 37.5-25 MG tablet Take 1 tablet by mouth daily.     Marland Kitchen venlafaxine XR (EFFEXOR-XR) 37.5 MG 24 hr capsule TAKE 1 CAPSULE BY MOUTH ONCE DAILY WITH BREAKFAST 30 capsule 0   No current facility-administered medications for this visit.    PHYSICAL  EXAMINATION: ECOG PERFORMANCE STATUS: 1 - Symptomatic but completely ambulatory  Vitals:   04/18/20 1016  BP: 107/88  Pulse: 70  Resp: 17  Temp: 98.5 F (36.9 C)  SpO2: 98%   Filed Weights   04/18/20 1016  Weight: 198 lb 9.6 oz (90.1 kg)    LABORATORY DATA:  I have reviewed the data as listed CMP Latest Ref Rng & Units 03/07/2020 01/25/2020 12/14/2019  Glucose 70 - 99 mg/dL 172(H) 103(H) 118(H)  BUN 8 - 23 mg/dL 10 10 7(L)  Creatinine 0.44 - 1.00 mg/dL 0.83 0.71 0.69  Sodium 135 - 145 mmol/L 140 141 141  Potassium 3.5 - 5.1 mmol/L 3.6 4.0 3.4(L)  Chloride 98 - 111 mmol/L 103 104 106  CO2 22 - 32 mmol/L _0 Calcium 8.9 - 10.3 mg/dL 9.5 9.3 8.3(L)  Total Protein 6.5 - 8.1 g/dL 7.6 7.2 6.4(L)  Total Bilirubin 0.3 - 1.2 mg/dL 0.6 0.5 0.5  Alkaline Phos 38 - 126 U/L 75 61 57  AST 15 - 41 U/L 16 15 14(L)  ALT 0 - 44 U/L _1 Lab Results  Component Value Date   WBC 5.6 04/18/2020   HGB 12.4 04/18/2020   HCT 35.5 (L) 04/18/2020   MCV 87.7 04/18/2020   PLT 232 04/18/2020   NEUTROABS 3.6 04/18/2020    ASSESSMENT & PLAN:  Malignant neoplasm of upper-outer quadrant of right breast in female, estrogen receptor positive ([place]) 05/22/2019:Patient palpated a lump in the right breast. Mammogram showed 2.2cm mass in the UOQ, with no right axillary adenopathy. 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   Watchful monitoring. Previous anemia: Resolved  Anastrozole toxicities: Muscle cramps: Advised her to take tonic water at bedtime.    Return to clinic every 3 for Herceptin Perjeta maintenance every 6 weeks for follow-up with me.  Her last infusion of Herceptin will be in September 2021.    No orders of the defined types were placed in this encounter.  The patient has a good understanding of the overall plan. she agrees with it. she will call with any problems that may develop before the next visit here.  Total time spent: 30 mins including face to face time and time spent for planning, charting and coordination  of care  [name], MD 04/18/2020  Julious Oka Dorshimer, am acting as scribe for Dr. [name].  I have reviewed the above documentation for accuracy and completeness, and I agree with the above.

## 2020-04-18 ENCOUNTER — Inpatient Hospital Stay: Payer: Medicare Other

## 2020-04-18 ENCOUNTER — Encounter: Payer: Self-pay | Admitting: *Deleted

## 2020-04-18 ENCOUNTER — Inpatient Hospital Stay (HOSPITAL_BASED_OUTPATIENT_CLINIC_OR_DEPARTMENT_OTHER): Payer: Medicare Other | Admitting: Hematology and Oncology

## 2020-04-18 ENCOUNTER — Other Ambulatory Visit: Payer: Self-pay

## 2020-04-18 VITALS — BP 121/92 | HR 77 | Resp 16

## 2020-04-18 DIAGNOSIS — Z5112 Encounter for antineoplastic immunotherapy: Secondary | ICD-10-CM | POA: Diagnosis not present

## 2020-04-18 DIAGNOSIS — Z95828 Presence of other vascular implants and grafts: Secondary | ICD-10-CM

## 2020-04-18 DIAGNOSIS — Z170 Estrogen receptor positive status [ER+]: Secondary | ICD-10-CM

## 2020-04-18 DIAGNOSIS — C50411 Malignant neoplasm of upper-outer quadrant of right female breast: Secondary | ICD-10-CM | POA: Diagnosis not present

## 2020-04-18 LAB — CBC WITH DIFFERENTIAL (CANCER CENTER ONLY)
Abs Immature Granulocytes: 0.04 10*3/uL (ref 0.00–0.07)
Basophils Absolute: 0 10*3/uL (ref 0.0–0.1)
Basophils Relative: 1 %
Eosinophils Absolute: 0.1 10*3/uL (ref 0.0–0.5)
Eosinophils Relative: 2 %
HCT: 35.5 % — ABNORMAL LOW (ref 36.0–46.0)
Hemoglobin: 12.4 g/dL (ref 12.0–15.0)
Immature Granulocytes: 1 %
Lymphocytes Relative: 25 %
Lymphs Abs: 1.4 10*3/uL (ref 0.7–4.0)
MCH: 30.6 pg (ref 26.0–34.0)
MCHC: 34.9 g/dL (ref 30.0–36.0)
MCV: 87.7 fL (ref 80.0–100.0)
Monocytes Absolute: 0.4 10*3/uL (ref 0.1–1.0)
Monocytes Relative: 7 %
Neutro Abs: 3.6 10*3/uL (ref 1.7–7.7)
Neutrophils Relative %: 64 %
Platelet Count: 232 10*3/uL (ref 150–400)
RBC: 4.05 MIL/uL (ref 3.87–5.11)
RDW: 12.8 % (ref 11.5–15.5)
WBC Count: 5.6 10*3/uL (ref 4.0–10.5)
nRBC: 0 % (ref 0.0–0.2)

## 2020-04-18 LAB — CMP (CANCER CENTER ONLY)
ALT: 14 U/L (ref 0–44)
AST: 18 U/L (ref 15–41)
Albumin: 3.8 g/dL (ref 3.5–5.0)
Alkaline Phosphatase: 65 U/L (ref 38–126)
Anion gap: 11 (ref 5–15)
BUN: 11 mg/dL (ref 8–23)
CO2: 27 mmol/L (ref 22–32)
Calcium: 9.8 mg/dL (ref 8.9–10.3)
Chloride: 101 mmol/L (ref 98–111)
Creatinine: 0.9 mg/dL (ref 0.44–1.00)
GFR, Est AFR Am: >60 mL/min (ref >60)
GFR, Estimated: >60 mL/min (ref >60)
Glucose, Bld: 118 mg/dL — ABNORMAL HIGH (ref 70–99)
Potassium: 4 mmol/L (ref 3.5–5.1)
Sodium: 139 mmol/L (ref 135–145)
Total Bilirubin: 0.5 mg/dL (ref 0.3–1.2)
Total Protein: 7.6 g/dL (ref 6.5–8.1)

## 2020-04-18 MED ORDER — ACETAMINOPHEN 325 MG PO TABS
ORAL_TABLET | ORAL | Status: AC
  Filled 2020-04-18: qty 2

## 2020-04-18 MED ORDER — ACETAMINOPHEN 325 MG PO TABS
650.0000 mg | ORAL_TABLET | Freq: Once | ORAL | Status: AC
  Administered 2020-04-18: 650 mg via ORAL

## 2020-04-18 MED ORDER — SODIUM CHLORIDE 0.9% FLUSH
10.0000 mL | INTRAVENOUS | Status: DC | PRN
  Administered 2020-04-18: 10 mL
  Filled 2020-04-18: qty 10

## 2020-04-18 MED ORDER — DIPHENHYDRAMINE HCL 25 MG PO CAPS
25.0000 mg | ORAL_CAPSULE | Freq: Once | ORAL | Status: AC
  Administered 2020-04-18: 25 mg via ORAL

## 2020-04-18 MED ORDER — DIPHENHYDRAMINE HCL 25 MG PO CAPS
ORAL_CAPSULE | ORAL | Status: AC
  Filled 2020-04-18: qty 1

## 2020-04-18 MED ORDER — TRASTUZUMAB-DKST CHEMO 150 MG IV SOLR
6.0000 mg/kg | Freq: Once | INTRAVENOUS | Status: AC
  Administered 2020-04-18: 546 mg via INTRAVENOUS
  Filled 2020-04-18: qty 26

## 2020-04-18 MED ORDER — HEPARIN SOD (PORK) LOCK FLUSH 100 UNIT/ML IV SOLN
500.0000 [IU] | Freq: Once | INTRAVENOUS | Status: AC | PRN
  Administered 2020-04-18: 500 [IU]
  Filled 2020-04-18: qty 5

## 2020-04-18 MED ORDER — SODIUM CHLORIDE 0.9 % IV SOLN
420.0000 mg | Freq: Once | INTRAVENOUS | Status: AC
  Administered 2020-04-18: 420 mg via INTRAVENOUS
  Filled 2020-04-18: qty 14

## 2020-04-18 MED ORDER — SODIUM CHLORIDE 0.9 % IV SOLN
Freq: Once | INTRAVENOUS | Status: AC
  Filled 2020-04-18: qty 250

## 2020-04-18 NOTE — Patient Instructions (Signed)

## 2020-04-18 NOTE — Patient Instructions (Signed)
[facility] Discharge Instructions for Patients Receiving Chemotherapy  Today you received the following chemotherapy agents Trastuzumab-dkst and Perjeta  To help prevent nausea and vomiting after your treatment, we encourage you to take your nausea medication as directed.    If you develop nausea and vomiting that is not controlled by your nausea medication, call the clinic.   BELOW ARE SYMPTOMS THAT SHOULD BE REPORTED IMMEDIATELY:  *FEVER GREATER THAN 100.5 F  *CHILLS WITH OR WITHOUT FEVER  NAUSEA AND VOMITING THAT IS NOT CONTROLLED WITH YOUR NAUSEA MEDICATION  *UNUSUAL SHORTNESS OF BREATH  *UNUSUAL BRUISING OR BLEEDING  TENDERNESS IN MOUTH AND THROAT WITH OR WITHOUT PRESENCE OF ULCERS  *URINARY PROBLEMS  *BOWEL PROBLEMS  UNUSUAL RASH Items with * indicate a potential emergency and should be followed up as soon as possible.  Feel free to call the clinic should you have any questions or concerns. The clinic phone number is [phone].  Please show the CHEMO ALERT CARD at check-in to the Emergency Department and triage nurse.   

## 2020-04-18 NOTE — Assessment & Plan Note (Signed)
05/22/2019:Patient palpated a lump in the right breast. Mammogram showed 2.2cm mass in the UOQ, with no right axillary adenopathy. 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 Stage 2A 2.Adjuvant chemotherapy with [place] Perjetax6 started 9/14/2020completed 12/28/2020followed by Herceptin Perjeta maintenance 3.Adjuvant antiestrogen therapy with anastrozole 1 mg daily x7 years [id number]: Taxane neuropathy study: No adverse effects due to participation in the study -------------------------------------------------------------------------------------------------------------------------- Treatment plan: Herceptin Perjeta maintenancealong with anastrozole (to be started 12/14/2019), Herceptin Perjeta will be completed 08/01/2020. 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   Watchful monitoring. Previous anemia: Resolved  Return to clinic every 3 for Herceptin Perjeta maintenance every 6 weeks for follow-up with me.

## 2020-04-26 ENCOUNTER — Other Ambulatory Visit: Payer: Self-pay | Admitting: Hematology and Oncology

## 2020-04-26 DIAGNOSIS — Z170 Estrogen receptor positive status [ER+]: Secondary | ICD-10-CM

## 2020-04-27 ENCOUNTER — Other Ambulatory Visit: Payer: Self-pay | Admitting: *Deleted

## 2020-04-28 ENCOUNTER — Other Ambulatory Visit: Payer: Self-pay | Admitting: Hematology and Oncology

## 2020-04-28 DIAGNOSIS — C50411 Malignant neoplasm of upper-outer quadrant of right female breast: Secondary | ICD-10-CM

## 2020-04-28 DIAGNOSIS — Z170 Estrogen receptor positive status [ER+]: Secondary | ICD-10-CM

## 2020-04-28 MED ORDER — GABAPENTIN 100 MG PO CAPS: 100.0000 mg | ORAL_CAPSULE | Freq: Every day | ORAL | 3 refills | Status: DC

## 2020-04-28 MED ORDER — LORAZEPAM 0.5 MG PO TABS: 0.5000 mg | ORAL_TABLET | Freq: Every evening | ORAL | 2 refills | Status: DC | PRN

## 2020-05-04 ENCOUNTER — Other Ambulatory Visit: Payer: Self-pay | Admitting: *Deleted

## 2020-05-04 MED ORDER — VENLAFAXINE HCL ER 37.5 MG PO CP24: ORAL_CAPSULE | ORAL | 1 refills | Status: DC

## 2020-05-09 ENCOUNTER — Inpatient Hospital Stay: Payer: Medicare Other | Attending: Hematology and Oncology

## 2020-05-09 ENCOUNTER — Other Ambulatory Visit: Payer: Self-pay

## 2020-05-09 VITALS — BP 126/93 | HR 67 | Temp 97.8°F | Resp 16

## 2020-05-09 DIAGNOSIS — Z79899 Other long term (current) drug therapy: Secondary | ICD-10-CM | POA: Insufficient documentation

## 2020-05-09 DIAGNOSIS — C50411 Malignant neoplasm of upper-outer quadrant of right female breast: Secondary | ICD-10-CM | POA: Insufficient documentation

## 2020-05-09 DIAGNOSIS — Z5112 Encounter for antineoplastic immunotherapy: Secondary | ICD-10-CM | POA: Insufficient documentation

## 2020-05-09 DIAGNOSIS — Z170 Estrogen receptor positive status [ER+]: Secondary | ICD-10-CM | POA: Diagnosis not present

## 2020-05-09 MED ORDER — SODIUM CHLORIDE 0.9% FLUSH
10.0000 mL | INTRAVENOUS | Status: DC | PRN
  Administered 2020-05-09: 10 mL
  Filled 2020-05-09: qty 10

## 2020-05-09 MED ORDER — HEPARIN SOD (PORK) LOCK FLUSH 100 UNIT/ML IV SOLN
500.0000 [IU] | Freq: Once | INTRAVENOUS | Status: AC | PRN
  Administered 2020-05-09: 500 [IU]
  Filled 2020-05-09: qty 5

## 2020-05-09 MED ORDER — SODIUM CHLORIDE 0.9 % IV SOLN
Freq: Once | INTRAVENOUS | Status: AC
  Filled 2020-05-09: qty 250

## 2020-05-09 MED ORDER — DIPHENHYDRAMINE HCL 25 MG PO CAPS
ORAL_CAPSULE | ORAL | Status: AC
  Filled 2020-05-09: qty 1

## 2020-05-09 MED ORDER — SODIUM CHLORIDE 0.9 % IV SOLN
420.0000 mg | Freq: Once | INTRAVENOUS | Status: AC
  Administered 2020-05-09: 420 mg via INTRAVENOUS
  Filled 2020-05-09: qty 14

## 2020-05-09 MED ORDER — TRASTUZUMAB-DKST CHEMO 150 MG IV SOLR
6.0000 mg/kg | Freq: Once | INTRAVENOUS | Status: AC
  Administered 2020-05-09: 546 mg via INTRAVENOUS
  Filled 2020-05-09: qty 26

## 2020-05-09 MED ORDER — ACETAMINOPHEN 325 MG PO TABS
ORAL_TABLET | ORAL | Status: AC
  Filled 2020-05-09: qty 2

## 2020-05-09 MED ORDER — ACETAMINOPHEN 325 MG PO TABS
650.0000 mg | ORAL_TABLET | Freq: Once | ORAL | Status: AC
  Administered 2020-05-09: 650 mg via ORAL

## 2020-05-09 MED ORDER — DIPHENHYDRAMINE HCL 25 MG PO CAPS
25.0000 mg | ORAL_CAPSULE | Freq: Once | ORAL | Status: AC
  Administered 2020-05-09: 25 mg via ORAL

## 2020-05-09 NOTE — Patient Instructions (Signed)
[facility] Discharge Instructions for Patients Receiving Chemotherapy  Today you received the following chemotherapy agents Trastuzumab-dkst and Perjeta  To help prevent nausea and vomiting after your treatment, we encourage you to take your nausea medication as directed.    If you develop nausea and vomiting that is not controlled by your nausea medication, call the clinic.   BELOW ARE SYMPTOMS THAT SHOULD BE REPORTED IMMEDIATELY:  *FEVER GREATER THAN 100.5 F  *CHILLS WITH OR WITHOUT FEVER  NAUSEA AND VOMITING THAT IS NOT CONTROLLED WITH YOUR NAUSEA MEDICATION  *UNUSUAL SHORTNESS OF BREATH  *UNUSUAL BRUISING OR BLEEDING  TENDERNESS IN MOUTH AND THROAT WITH OR WITHOUT PRESENCE OF ULCERS  *URINARY PROBLEMS  *BOWEL PROBLEMS  UNUSUAL RASH Items with * indicate a potential emergency and should be followed up as soon as possible.  Feel free to call the clinic should you have any questions or concerns. The clinic phone number is [phone].  Please show the CHEMO ALERT CARD at check-in to the Emergency Department and triage nurse.   

## 2020-05-30 NOTE — Progress Notes (Signed)
Patient Care Team: Earney Mallet, MD as PCP - General (Family Medicine) [name], RN as Oncology Nurse Navigator Rockwell Germany, RN as Oncology Nurse Navigator [name], MD as Consulting Physician (Hematology and Oncology) [name], MD as Consulting Physician (General Surgery) [name], MD as Attending Physician (Radiation Oncology)  DIAGNOSIS:    ICD-10-CM   1. Malignant neoplasm of upper-outer quadrant of right breast in female, estrogen receptor positive ([place])  C50.411    Z17.0     SUMMARY OF ONCOLOGIC HISTORY: Oncology History  Malignant neoplasm of upper-outer quadrant of right breast in female, estrogen receptor positive ([place])  1992 Miscellaneous   Left mastectomy followed by adjuvant chemotherapy   05/22/2019 Initial Diagnosis   Patient palpated a lump in the right breast. Mammogram showed 2.2cm mass in the UOQ, with no right axillary adenopathy. 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 The Common Hereditary Cancers Panel offered by [facility] includes sequencing and/or deletion duplication testing of the following 48 genes: APC, ATM, AXIN2, BARD1, BMPR1A, BRCA1, BRCA2, BRIP1, CDH1, CDKN2A (p14ARF), CDKN2A (p16INK4a), CKD4, CHEK2, CTNNA1, DICER1, EPCAM (Deletion/duplication testing only), GREM1 (promoter region deletion/duplication testing only), KIT, MEN1, MLH1, MSH2, MSH3, MSH6, MUTYH, NBN, NF1, NHTL1, PALB2, PDGFRA, PMS2, POLD1, POLE, PTEN, RAD50, RAD51C, RAD51D, RNF43, SDHB, SDHC, SDHD, SMAD4, SMARCA4. STK11, TP53, TSC1,  TSC2, and VHL.  The following genes were evaluated for sequence changes only: SDHA and HOXB13 c.251G>A variant only. The report date is 06/18/2019.   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   07/17/2019 Cancer Staging   Staging form: Breast, AJCC 8th Edition - Pathologic: Stage IIA (pT2, pN0, cM0, G3, ER+, PR-, HER2+) - Signed by [name], MD on 07/17/2019   08/10/2019 - 12/13/2019 Chemotherapy   The patient had palonosetron (ALOXI) injection 0.25 mg, 0.25 mg, Intravenous,  Once, 6 of 6 cycles Administration: 0.25 mg (08/10/2019), 0.25 mg (08/31/2019), 0.25 mg (11/02/2019), 0.25 mg (11/23/2019), 0.25 mg (09/21/2019), 0.25 mg (10/12/2019) pegfilgrastim-jmdb (FULPHILA) injection 6 mg, 6 mg, Subcutaneous,  Once, 6 of 6 cycles Administration: 6 mg (08/12/2019), 6 mg (09/02/2019), 6 mg (11/04/2019), 6 mg (11/25/2019), 6 mg (09/23/2019), 6 mg (10/14/2019) CARBOplatin (PARAPLATIN) 600 mg in sodium chloride 0.9 % 250 mL chemo infusion, 600 mg (99.3 % of original dose 604.2 mg), Intravenous,  Once, 6 of 6 cycles Dose modification:   (original dose 604.2 mg, Cycle 1), 600 mg (original dose 604.2 mg, Cycle 1),   (original dose 604.2 mg, Cycle 2, Reason: Provider Judgment) Administration: 600 mg (08/10/2019), 500 mg (08/31/2019), 500 mg (11/02/2019), 500 mg (11/23/2019), 500 mg (09/21/2019), 500 mg (10/12/2019) DOCEtaxel (TAXOTERE) 140 mg in sodium chloride 0.9 % 250 mL chemo infusion, 65 mg/m2 = 140 mg (86.7 % of original dose 75 mg/m2), Intravenous,  Once, 6 of 6 cycles Dose modification: 65 mg/m2 (original dose 75 mg/m2, Cycle 1, Reason: Provider Judgment), 50 mg/m2 (original dose 75 mg/m2, Cycle 2, Reason: Provider Judgment), 60 mg/m2 (original dose 75 mg/m2, Cycle 2, Reason:  Dose not tolerated) Administration: 140 mg (08/10/2019), 130 mg (08/31/2019), 130 mg (11/02/2019), 130 mg (11/23/2019), 130 mg  (09/21/2019), 130 mg (10/12/2019) pertuzumab (PERJETA) 420 mg in sodium chloride 0.9 % 250 mL chemo infusion, 420 mg (50 % of original dose 840 mg), Intravenous, Once, 6 of 6 cycles Dose modification: 420 mg (original dose 840 mg, Cycle 1, Reason: Provider Judgment) Administration: 420 mg (08/10/2019), 420 mg (08/31/2019), 420 mg (11/02/2019), 420 mg (11/23/2019), 420 mg (09/21/2019), 420 mg (10/12/2019) fosaprepitant (EMEND) 150 mg, dexamethasone (DECADRON) 12 mg in sodium chloride 0.9 % 145 mL IVPB, , Intravenous,  Once, 6 of 6 cycles Administration:  (08/10/2019),  (08/31/2019),  (11/02/2019),  (11/23/2019),  (09/21/2019),  (10/12/2019) trastuzumab-dkst (OGIVRI) 750 mg in sodium chloride 0.9 % 250 mL chemo infusion, 714 mg, Intravenous,  Once, 6 of 6 cycles Administration: 750 mg (08/10/2019), 600 mg (08/31/2019), 546 mg (11/02/2019), 546 mg (11/23/2019), 600 mg (09/21/2019), 546 mg (10/12/2019)  for chemotherapy treatment.    12/14/2019 -  Chemotherapy   The patient had pertuzumab (PERJETA) 420 mg in sodium chloride 0.9 % 250 mL chemo infusion, 420 mg, Intravenous, Once, 8 of 12 cycles Administration: 420 mg (12/14/2019), 420 mg (03/07/2020), 420 mg (03/28/2020), 420 mg (01/04/2020), 420 mg (01/25/2020), 420 mg (02/15/2020), 420 mg (04/18/2020), 420 mg (05/09/2020) trastuzumab-dkst (OGIVRI) 546 mg in sodium chloride 0.9 % 250 mL chemo infusion, 6 mg/kg = 546 mg, Intravenous,  Once, 8 of 12 cycles Administration: 546 mg (12/14/2019), 546 mg (03/07/2020), 546 mg (03/28/2020), 546 mg (01/04/2020), 546 mg (01/25/2020), 546 mg (02/15/2020), 546 mg (04/18/2020), 546 mg (05/09/2020)  for chemotherapy treatment.      CHIEF COMPLIANT: Follow-up of Herceptin Perjeta maintenance  INTERVAL HISTORY: Marisa Spears is a 69 y.o. with above-mentioned history of HER-2 positive right breast cancerwho underwent a mastectomy, adjuvant chemotherapy, andis currently on Herceptin Perjeta maintenance. She presents to the clinic  todayfortreatment.She is tolerating Herceptin and Perjeta maintenance extremely well without any problems or concerns.  Denies any nausea vomiting.  ALLERGIES:  is allergic to codeine.  MEDICATIONS:  Current Outpatient Medications  Medication Sig Dispense Refill  . anastrozole (ARIMIDEX) 1 MG tablet Take 1 tablet (1 mg total) by mouth daily. (Patient not taking: Reported on 11/23/2019) 90 tablet 3  . aspirin EC 81 MG tablet Take 81 mg by mouth at bedtime.     . Biotin w/ Vitamins C & E (HAIR/SKIN/NAILS PO) Take 2 tablets by mouth daily.    . Calcium Carb-Cholecalciferol (CALCIUM 600+D3 PO) Take 1 tablet by mouth daily.    . Cholecalciferol (EQL VITAMIN D3) 50 MCG (2000 UT) CAPS Take 4,000 Units by mouth daily.    . diphenoxylate-atropine (LOMOTIL) 2.5-0.025 MG tablet Take 1 tablet by mouth 2 (two) times daily. 60 tablet 3  . gabapentin (NEURONTIN) 100 MG capsule Take 1 capsule (100 mg total) by mouth at bedtime. 90 capsule 3  . lidocaine-prilocaine (EMLA) cream APPLY CREAM TOPICALLY TO AFFECTED AREA ONCE    . LORazepam (ATIVAN) 0.5 MG tablet Take 1 tablet (0.5 mg total) by mouth at bedtime as needed. for sleep 30 tablet 2  . methocarbamol (ROBAXIN) 500 MG tablet Take 1 tablet (500 mg total) by mouth every 6 (six) hours as needed for muscle spasms. (Patient not taking: Reported on 11/23/2019) 30 tablet 1  . Omega-3 Fatty Acids (FISH OIL ULTRA) 1400 MG CAPS Take 1,400 mg by mouth 2 (two) times daily.    . pravastatin (PRAVACHOL) 40 MG tablet Take 40 mg by mouth at  bedtime.     . traMADol (ULTRAM) 50 MG tablet Take 1 tablet (50 mg total) by mouth every 6 (six) hours as needed (mild pain). (Patient not taking: Reported on 11/23/2019) 30 tablet 0  . triamterene-hydrochlorothiazide (MAXZIDE-25) 37.5-25 MG tablet Take 1 tablet by mouth daily.     Marland Kitchen venlafaxine XR (EFFEXOR-XR) 37.5 MG 24 hr capsule TAKE 1 CAPSULE BY MOUTH ONCE DAILY WITH BREAKFAST 90 capsule 1   No current facility-administered  medications for this visit.    PHYSICAL EXAMINATION: ECOG PERFORMANCE STATUS: 1 - Symptomatic but completely ambulatory  Vitals:   05/31/20 1122  BP: (!) 147/93  Pulse: (!) 53  Resp: 17  Temp: 98.3 F (36.8 C)  SpO2: 100%   Filed Weights   05/31/20 1122  Weight: 198 lb 12.8 oz (90.2 kg)     LABORATORY DATA:  I have reviewed the data as listed CMP Latest Ref Rng & Units 04/18/2020 03/07/2020 01/25/2020  Glucose 70 - 99 mg/dL 118(H) 172(H) 103(H)  BUN 8 - 23 mg/dL '11 10 10  '$ Creatinine 0.44 - 1.00 mg/dL 0.90 0.83 0.71  Sodium 135 - 145 mmol/L 139 140 141  Potassium 3.5 - 5.1 mmol/L 4.0 3.6 4.0  Chloride 98 - 111 mmol/L 101 103 104  CO2 22 - 32 mmol/L '27 25 29  '$ Calcium 8.9 - 10.3 mg/dL 9.8 9.5 9.3  Total Protein 6.5 - 8.1 g/dL 7.6 7.6 7.2  Total Bilirubin 0.3 - 1.2 mg/dL 0.5 0.6 0.5  Alkaline Phos 38 - 126 U/L 65 75 61  AST 15 - 41 U/L '18 16 15  '$ ALT 0 - 44 U/L '14 13 10    '$ Lab Results  Component Value Date   WBC 5.6 04/18/2020   HGB 12.4 04/18/2020   HCT 35.5 (L) 04/18/2020   MCV 87.7 04/18/2020   PLT 232 04/18/2020   NEUTROABS 3.6 04/18/2020    ASSESSMENT & PLAN:  Malignant neoplasm of upper-outer quadrant of right breast in female, estrogen receptor positive (Marisa Spears) 05/22/2019:Patient palpated a lump in the right breast. Mammogram showed 2.2cm mass in the UOQ, with no right axillary adenopathy. 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 Stage 2A 2.Adjuvant chemotherapy with [place] Perjetax6 started 9/14/2020completed 12/28/2020followed by Herceptin Perjeta maintenance 3.Adjuvant antiestrogen therapy with anastrozole 1 mg  daily x7 years S1714: Taxane neuropathy study: No adverse effects due to participation in the study -------------------------------------------------------------------------------------------------------------------------- Treatment plan: Herceptin Perjeta maintenancealong with anastrozole (to be started 12/14/2019), Herceptin Perjeta will be completed 08/01/2020.  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 3 times daily.  If it still not getting better then we will increase it to 300 mg 3 times daily. Next echocardiogram will be done in August.  Anastrozole toxicities: Muscle cramps: Advised her to take tonic water at bedtime.    Return to clinic every 3 for Herceptin Perjeta maintenance every 6 weeks for follow-up with me.  Her last infusion of Herceptin will be in September 2021.  No orders of the defined types were placed in this encounter.  The patient has a good understanding of the overall plan. she agrees with  it. she will call with any problems that may develop before the next visit here.  Total time spent: 30 mins including face to face time and time spent for planning, charting and coordination of care  [name], MD 05/31/2020  I, Cloyde Reams Dorshimer, am acting as scribe for Dr. [name].  I have reviewed the above documentation for accuracy and completeness, and I agree with the above.

## 2020-05-31 ENCOUNTER — Inpatient Hospital Stay: Payer: Medicare Other | Attending: Hematology and Oncology

## 2020-05-31 ENCOUNTER — Inpatient Hospital Stay: Payer: Medicare Other

## 2020-05-31 ENCOUNTER — Other Ambulatory Visit: Payer: Self-pay

## 2020-05-31 ENCOUNTER — Inpatient Hospital Stay (HOSPITAL_BASED_OUTPATIENT_CLINIC_OR_DEPARTMENT_OTHER): Payer: Medicare Other | Admitting: Hematology and Oncology

## 2020-05-31 VITALS — BP 147/93 | HR 53 | Temp 98.3°F | Resp 17 | Ht 69.0 in | Wt 198.8 lb

## 2020-05-31 DIAGNOSIS — Z5112 Encounter for antineoplastic immunotherapy: Secondary | ICD-10-CM | POA: Diagnosis present

## 2020-05-31 DIAGNOSIS — Z95828 Presence of other vascular implants and grafts: Secondary | ICD-10-CM

## 2020-05-31 DIAGNOSIS — Z170 Estrogen receptor positive status [ER+]: Secondary | ICD-10-CM | POA: Diagnosis not present

## 2020-05-31 DIAGNOSIS — C50411 Malignant neoplasm of upper-outer quadrant of right female breast: Secondary | ICD-10-CM

## 2020-05-31 DIAGNOSIS — Z9289 Personal history of other medical treatment: Secondary | ICD-10-CM

## 2020-05-31 DIAGNOSIS — I5021 Acute systolic (congestive) heart failure: Secondary | ICD-10-CM

## 2020-05-31 DIAGNOSIS — Z79899 Other long term (current) drug therapy: Secondary | ICD-10-CM | POA: Diagnosis not present

## 2020-05-31 LAB — CBC WITH DIFFERENTIAL (CANCER CENTER ONLY)
Abs Immature Granulocytes: 0.01 10*3/uL (ref 0.00–0.07)
Basophils Absolute: 0 10*3/uL (ref 0.0–0.1)
Basophils Relative: 1 %
Eosinophils Absolute: 0.1 10*3/uL (ref 0.0–0.5)
Eosinophils Relative: 2 %
HCT: 36.6 % (ref 36.0–46.0)
Hemoglobin: 12.6 g/dL (ref 12.0–15.0)
Immature Granulocytes: 0 %
Lymphocytes Relative: 30 %
Lymphs Abs: 1.6 10*3/uL (ref 0.7–4.0)
MCH: 31.4 pg (ref 26.0–34.0)
MCHC: 34.4 g/dL (ref 30.0–36.0)
MCV: 91.3 fL (ref 80.0–100.0)
Monocytes Absolute: 0.4 10*3/uL (ref 0.1–1.0)
Monocytes Relative: 7 %
Neutro Abs: 3.2 10*3/uL (ref 1.7–7.7)
Neutrophils Relative %: 60 %
Platelet Count: 216 10*3/uL (ref 150–400)
RBC: 4.01 MIL/uL (ref 3.87–5.11)
RDW: 13.3 % (ref 11.5–15.5)
WBC Count: 5.3 10*3/uL (ref 4.0–10.5)
nRBC: 0 % (ref 0.0–0.2)

## 2020-05-31 LAB — CMP (CANCER CENTER ONLY)
ALT: 16 U/L (ref 0–44)
AST: 23 U/L (ref 15–41)
Albumin: 4.1 g/dL (ref 3.5–5.0)
Alkaline Phosphatase: 68 U/L (ref 38–126)
Anion gap: 9 (ref 5–15)
BUN: 9 mg/dL (ref 8–23)
CO2: 26 mmol/L (ref 22–32)
Calcium: 9.9 mg/dL (ref 8.9–10.3)
Chloride: 105 mmol/L (ref 98–111)
Creatinine: 0.79 mg/dL (ref 0.44–1.00)
GFR, Est AFR Am: >60 mL/min (ref >60)
GFR, Estimated: >60 mL/min (ref >60)
Glucose, Bld: 100 mg/dL — ABNORMAL HIGH (ref 70–99)
Potassium: 3.8 mmol/L (ref 3.5–5.1)
Sodium: 140 mmol/L (ref 135–145)
Total Bilirubin: 0.7 mg/dL (ref 0.3–1.2)
Total Protein: 7.3 g/dL (ref 6.5–8.1)

## 2020-05-31 MED ORDER — SODIUM CHLORIDE 0.9 % IV SOLN
420.0000 mg | Freq: Once | INTRAVENOUS | Status: AC
  Administered 2020-05-31: 420 mg via INTRAVENOUS
  Filled 2020-05-31: qty 14

## 2020-05-31 MED ORDER — DIPHENHYDRAMINE HCL 25 MG PO CAPS
ORAL_CAPSULE | ORAL | Status: AC
  Filled 2020-05-31: qty 2

## 2020-05-31 MED ORDER — HEPARIN SOD (PORK) LOCK FLUSH 100 UNIT/ML IV SOLN
500.0000 [IU] | Freq: Once | INTRAVENOUS | Status: AC | PRN
  Administered 2020-05-31: 500 [IU]
  Filled 2020-05-31: qty 5

## 2020-05-31 MED ORDER — DIPHENHYDRAMINE HCL 25 MG PO CAPS
25.0000 mg | ORAL_CAPSULE | Freq: Once | ORAL | Status: AC
  Administered 2020-05-31: 25 mg via ORAL

## 2020-05-31 MED ORDER — SODIUM CHLORIDE 0.9% FLUSH
10.0000 mL | INTRAVENOUS | Status: DC | PRN
  Administered 2020-05-31: 10 mL
  Filled 2020-05-31: qty 10

## 2020-05-31 MED ORDER — ACETAMINOPHEN 325 MG PO TABS
ORAL_TABLET | ORAL | Status: AC
  Filled 2020-05-31: qty 2

## 2020-05-31 MED ORDER — ACETAMINOPHEN 325 MG PO TABS
650.0000 mg | ORAL_TABLET | Freq: Once | ORAL | Status: AC
  Administered 2020-05-31: 650 mg via ORAL

## 2020-05-31 MED ORDER — VENLAFAXINE HCL ER 37.5 MG PO CP24: ORAL_CAPSULE | ORAL | 3 refills | Status: DC

## 2020-05-31 MED ORDER — SODIUM CHLORIDE 0.9 % IV SOLN
Freq: Once | INTRAVENOUS | Status: AC
  Filled 2020-05-31: qty 250

## 2020-05-31 MED ORDER — GABAPENTIN 100 MG PO CAPS: 100.0000 mg | ORAL_CAPSULE | Freq: Three times a day (TID) | ORAL | 3 refills | Status: DC

## 2020-05-31 MED ORDER — TRASTUZUMAB-DKST CHEMO 150 MG IV SOLR
6.0000 mg/kg | Freq: Once | INTRAVENOUS | Status: AC
  Administered 2020-05-31: 546 mg via INTRAVENOUS
  Filled 2020-05-31: qty 26

## 2020-05-31 NOTE — Patient Instructions (Signed)
[facility] Discharge Instructions for Patients Receiving Chemotherapy  Today you received the following chemotherapy agents Trastuzumab-dkst and Perjeta  To help prevent nausea and vomiting after your treatment, we encourage you to take your nausea medication as directed.    If you develop nausea and vomiting that is not controlled by your nausea medication, call the clinic.   BELOW ARE SYMPTOMS THAT SHOULD BE REPORTED IMMEDIATELY:  *FEVER GREATER THAN 100.5 F  *CHILLS WITH OR WITHOUT FEVER  NAUSEA AND VOMITING THAT IS NOT CONTROLLED WITH YOUR NAUSEA MEDICATION  *UNUSUAL SHORTNESS OF BREATH  *UNUSUAL BRUISING OR BLEEDING  TENDERNESS IN MOUTH AND THROAT WITH OR WITHOUT PRESENCE OF ULCERS  *URINARY PROBLEMS  *BOWEL PROBLEMS  UNUSUAL RASH Items with * indicate a potential emergency and should be followed up as soon as possible.  Feel free to call the clinic should you have any questions or concerns. The clinic phone number is [phone].  Please show the CHEMO ALERT CARD at check-in to the Emergency Department and triage nurse.   

## 2020-05-31 NOTE — Assessment & Plan Note (Signed)
05/22/2019:Patient palpated a lump in the right breast. Mammogram showed 2.2cm mass in the UOQ, with no right axillary adenopathy. 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 Stage 2A 2.Adjuvant chemotherapy with [place] Perjetax6 started 9/14/2020completed 12/28/2020followed by Herceptin Perjeta maintenance 3.Adjuvant antiestrogen therapy with anastrozole 1 mg daily x7 years S1714: Taxane neuropathy study: No adverse effects due to participation in the study -------------------------------------------------------------------------------------------------------------------------- Treatment plan: Herceptin Perjeta maintenancealong with anastrozole (to be started 12/14/2019), Herceptin Perjeta will be completed 08/01/2020.  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    Return to clinic every 3 for Herceptin Perjeta maintenance every 6 weeks for follow-up with me.  Her last infusion of Herceptin will be in September 2021.

## 2020-06-01 ENCOUNTER — Telehealth: Payer: Self-pay | Admitting: Hematology and Oncology

## 2020-06-01 NOTE — Telephone Encounter (Signed)
Scheduled per 7/6 los. Called and spoke with pt, confirmed 9/7 appts

## 2020-06-20 ENCOUNTER — Other Ambulatory Visit: Payer: Self-pay

## 2020-06-20 ENCOUNTER — Inpatient Hospital Stay: Payer: Medicare Other

## 2020-06-20 VITALS — BP 133/92 | HR 65 | Temp 97.6°F | Resp 17 | Wt 199.8 lb

## 2020-06-20 DIAGNOSIS — Z5112 Encounter for antineoplastic immunotherapy: Secondary | ICD-10-CM | POA: Diagnosis not present

## 2020-06-20 DIAGNOSIS — Z170 Estrogen receptor positive status [ER+]: Secondary | ICD-10-CM

## 2020-06-20 MED ORDER — SODIUM CHLORIDE 0.9% FLUSH
10.0000 mL | INTRAVENOUS | Status: DC | PRN
  Filled 2020-06-20: qty 10

## 2020-06-20 MED ORDER — SODIUM CHLORIDE 0.9 % IV SOLN
420.0000 mg | Freq: Once | INTRAVENOUS | Status: AC
  Administered 2020-06-20: 420 mg via INTRAVENOUS
  Filled 2020-06-20: qty 14

## 2020-06-20 MED ORDER — ACETAMINOPHEN 325 MG PO TABS
650.0000 mg | ORAL_TABLET | Freq: Once | ORAL | Status: AC
  Administered 2020-06-20: 650 mg via ORAL

## 2020-06-20 MED ORDER — ACETAMINOPHEN 325 MG PO TABS
ORAL_TABLET | ORAL | Status: AC
  Filled 2020-06-20: qty 2

## 2020-06-20 MED ORDER — HEPARIN SOD (PORK) LOCK FLUSH 100 UNIT/ML IV SOLN
500.0000 [IU] | Freq: Once | INTRAVENOUS | Status: DC | PRN
  Filled 2020-06-20: qty 5

## 2020-06-20 MED ORDER — DIPHENHYDRAMINE HCL 25 MG PO CAPS
25.0000 mg | ORAL_CAPSULE | Freq: Once | ORAL | Status: AC
  Administered 2020-06-20: 25 mg via ORAL

## 2020-06-20 MED ORDER — DIPHENHYDRAMINE HCL 25 MG PO CAPS
ORAL_CAPSULE | ORAL | Status: AC
  Filled 2020-06-20: qty 1

## 2020-06-20 MED ORDER — TRASTUZUMAB-DKST CHEMO 150 MG IV SOLR
6.0000 mg/kg | Freq: Once | INTRAVENOUS | Status: AC
  Administered 2020-06-20: 546 mg via INTRAVENOUS
  Filled 2020-06-20: qty 26

## 2020-06-20 MED ORDER — SODIUM CHLORIDE 0.9 % IV SOLN
Freq: Once | INTRAVENOUS | Status: AC
  Filled 2020-06-20: qty 250

## 2020-06-20 NOTE — Patient Instructions (Signed)
[facility] Discharge Instructions for Patients Receiving Chemotherapy  Today you received the following chemotherapy agents: trastuzumab-dkst/pertuzumab.  To help prevent nausea and vomiting after your treatment, we encourage you to take your nausea medication as directed.   If you develop nausea and vomiting that is not controlled by your nausea medication, call the clinic.   BELOW ARE SYMPTOMS THAT SHOULD BE REPORTED IMMEDIATELY:  *FEVER GREATER THAN 100.5 F  *CHILLS WITH OR WITHOUT FEVER  NAUSEA AND VOMITING THAT IS NOT CONTROLLED WITH YOUR NAUSEA MEDICATION  *UNUSUAL SHORTNESS OF BREATH  *UNUSUAL BRUISING OR BLEEDING  TENDERNESS IN MOUTH AND THROAT WITH OR WITHOUT PRESENCE OF ULCERS  *URINARY PROBLEMS  *BOWEL PROBLEMS  UNUSUAL RASH Items with * indicate a potential emergency and should be followed up as soon as possible.  Feel free to call the clinic should you have any questions or concerns. The clinic phone number is [phone].  Please show the [place] at check-in to the Emergency Department and triage nurse.

## 2020-07-01 ENCOUNTER — Other Ambulatory Visit (HOSPITAL_COMMUNITY): Payer: Medicare Other

## 2020-07-06 ENCOUNTER — Other Ambulatory Visit: Payer: Self-pay

## 2020-07-06 ENCOUNTER — Ambulatory Visit (HOSPITAL_COMMUNITY)
Admission: RE | Admit: 2020-07-06 | Discharge: 2020-07-06 | Disposition: A | Discharge status: Home / Self Care | Payer: Medicare Other | Source: Ambulatory Visit | Attending: Hematology and Oncology | Admitting: Hematology and Oncology

## 2020-07-06 DIAGNOSIS — Z5181 Encounter for therapeutic drug level monitoring: Secondary | ICD-10-CM | POA: Insufficient documentation

## 2020-07-06 DIAGNOSIS — I5021 Acute systolic (congestive) heart failure: Secondary | ICD-10-CM

## 2020-07-06 DIAGNOSIS — I10 Essential (primary) hypertension: Secondary | ICD-10-CM | POA: Insufficient documentation

## 2020-07-06 DIAGNOSIS — C50411 Malignant neoplasm of upper-outer quadrant of right female breast: Secondary | ICD-10-CM | POA: Diagnosis not present

## 2020-07-06 DIAGNOSIS — Z170 Estrogen receptor positive status [ER+]: Secondary | ICD-10-CM | POA: Insufficient documentation

## 2020-07-06 DIAGNOSIS — Z9289 Personal history of other medical treatment: Secondary | ICD-10-CM

## 2020-07-06 DIAGNOSIS — Z79899 Other long term (current) drug therapy: Secondary | ICD-10-CM | POA: Diagnosis not present

## 2020-07-06 LAB — ECHOCARDIOGRAM COMPLETE
Area-P 1/2: 2.07 cm2
S' Lateral: 3.6 cm

## 2020-07-06 NOTE — Progress Notes (Signed)
  Echocardiogram 2D Echocardiogram has been performed.  Marisa Spears 07/06/2020, 11:09 AM

## 2020-07-12 ENCOUNTER — Inpatient Hospital Stay: Payer: Medicare Other | Attending: Hematology and Oncology

## 2020-07-12 ENCOUNTER — Inpatient Hospital Stay: Payer: Medicare Other

## 2020-07-12 ENCOUNTER — Other Ambulatory Visit: Payer: Self-pay

## 2020-07-12 ENCOUNTER — Encounter: Payer: Self-pay | Admitting: *Deleted

## 2020-07-12 ENCOUNTER — Inpatient Hospital Stay (HOSPITAL_BASED_OUTPATIENT_CLINIC_OR_DEPARTMENT_OTHER): Payer: Medicare Other | Admitting: Hematology and Oncology

## 2020-07-12 VITALS — BP 128/73 | HR 60 | Resp 18

## 2020-07-12 DIAGNOSIS — C50411 Malignant neoplasm of upper-outer quadrant of right female breast: Secondary | ICD-10-CM | POA: Diagnosis present

## 2020-07-12 DIAGNOSIS — Z79899 Other long term (current) drug therapy: Secondary | ICD-10-CM | POA: Insufficient documentation

## 2020-07-12 DIAGNOSIS — Z170 Estrogen receptor positive status [ER+]: Secondary | ICD-10-CM

## 2020-07-12 DIAGNOSIS — Z5112 Encounter for antineoplastic immunotherapy: Secondary | ICD-10-CM | POA: Insufficient documentation

## 2020-07-12 DIAGNOSIS — Z95828 Presence of other vascular implants and grafts: Secondary | ICD-10-CM

## 2020-07-12 LAB — CMP (CANCER CENTER ONLY)
ALT: 14 U/L (ref 0–44)
AST: 18 U/L (ref 15–41)
Albumin: 3.9 g/dL (ref 3.5–5.0)
Alkaline Phosphatase: 70 U/L (ref 38–126)
Anion gap: 9 (ref 5–15)
BUN: 12 mg/dL (ref 8–23)
CO2: 27 mmol/L (ref 22–32)
Calcium: 10 mg/dL (ref 8.9–10.3)
Chloride: 103 mmol/L (ref 98–111)
Creatinine: 0.79 mg/dL (ref 0.44–1.00)
GFR, Est AFR Am: >60 mL/min (ref >60)
GFR, Estimated: >60 mL/min (ref >60)
Glucose, Bld: 124 mg/dL — ABNORMAL HIGH (ref 70–99)
Potassium: 4 mmol/L (ref 3.5–5.1)
Sodium: 139 mmol/L (ref 135–145)
Total Bilirubin: 0.7 mg/dL (ref 0.3–1.2)
Total Protein: 7.3 g/dL (ref 6.5–8.1)

## 2020-07-12 LAB — CBC WITH DIFFERENTIAL (CANCER CENTER ONLY)
Abs Immature Granulocytes: 0.02 10*3/uL (ref 0.00–0.07)
Basophils Absolute: 0 10*3/uL (ref 0.0–0.1)
Basophils Relative: 0 %
Eosinophils Absolute: 0.1 10*3/uL (ref 0.0–0.5)
Eosinophils Relative: 2 %
HCT: 38.1 % (ref 36.0–46.0)
Hemoglobin: 12.9 g/dL (ref 12.0–15.0)
Immature Granulocytes: 0 %
Lymphocytes Relative: 26 %
Lymphs Abs: 1.8 10*3/uL (ref 0.7–4.0)
MCH: 30.5 pg (ref 26.0–34.0)
MCHC: 33.9 g/dL (ref 30.0–36.0)
MCV: 90.1 fL (ref 80.0–100.0)
Monocytes Absolute: 0.4 10*3/uL (ref 0.1–1.0)
Monocytes Relative: 5 %
Neutro Abs: 4.6 10*3/uL (ref 1.7–7.7)
Neutrophils Relative %: 67 %
Platelet Count: 203 10*3/uL (ref 150–400)
RBC: 4.23 MIL/uL (ref 3.87–5.11)
RDW: 13 % (ref 11.5–15.5)
WBC Count: 6.8 10*3/uL (ref 4.0–10.5)
nRBC: 0 % (ref 0.0–0.2)

## 2020-07-12 MED ORDER — ACETAMINOPHEN 325 MG PO TABS
650.0000 mg | ORAL_TABLET | Freq: Once | ORAL | Status: AC
  Administered 2020-07-12: 650 mg via ORAL

## 2020-07-12 MED ORDER — DIPHENHYDRAMINE HCL 25 MG PO CAPS
25.0000 mg | ORAL_CAPSULE | Freq: Once | ORAL | Status: AC
  Administered 2020-07-12: 25 mg via ORAL

## 2020-07-12 MED ORDER — TRASTUZUMAB-DKST CHEMO 150 MG IV SOLR
6.0000 mg/kg | Freq: Once | INTRAVENOUS | Status: AC
  Administered 2020-07-12: 546 mg via INTRAVENOUS
  Filled 2020-07-12: qty 26

## 2020-07-12 MED ORDER — SODIUM CHLORIDE 0.9% FLUSH
10.0000 mL | INTRAVENOUS | Status: DC | PRN
  Administered 2020-07-12: 10 mL
  Filled 2020-07-12: qty 10

## 2020-07-12 MED ORDER — DIPHENHYDRAMINE HCL 25 MG PO CAPS
ORAL_CAPSULE | ORAL | Status: AC
  Filled 2020-07-12: qty 1

## 2020-07-12 MED ORDER — ACETAMINOPHEN 325 MG PO TABS
ORAL_TABLET | ORAL | Status: AC
  Filled 2020-07-12: qty 2

## 2020-07-12 MED ORDER — SODIUM CHLORIDE 0.9 % IV SOLN
420.0000 mg | Freq: Once | INTRAVENOUS | Status: AC
  Administered 2020-07-12: 420 mg via INTRAVENOUS
  Filled 2020-07-12: qty 14

## 2020-07-12 MED ORDER — SODIUM CHLORIDE 0.9 % IV SOLN
Freq: Once | INTRAVENOUS | Status: AC
  Filled 2020-07-12: qty 250

## 2020-07-12 MED ORDER — GABAPENTIN 300 MG PO CAPS: 300.0000 mg | ORAL_CAPSULE | Freq: Three times a day (TID) | ORAL | 3 refills | Status: DC

## 2020-07-12 MED ORDER — HEPARIN SOD (PORK) LOCK FLUSH 100 UNIT/ML IV SOLN
500.0000 [IU] | Freq: Once | INTRAVENOUS | Status: AC | PRN
  Administered 2020-07-12: 500 [IU]
  Filled 2020-07-12: qty 5

## 2020-07-12 NOTE — Patient Instructions (Signed)
[facility] Discharge Instructions for Patients Receiving Chemotherapy  Today you received the following chemotherapy agents: Trastuzumab and Perjeta  To help prevent nausea and vomiting after your treatment, we encourage you to take your nausea medication as prescribed.    If you develop nausea and vomiting that is not controlled by your nausea medication, call the clinic.   BELOW ARE SYMPTOMS THAT SHOULD BE REPORTED IMMEDIATELY:  *FEVER GREATER THAN 100.5 F  *CHILLS WITH OR WITHOUT FEVER  NAUSEA AND VOMITING THAT IS NOT CONTROLLED WITH YOUR NAUSEA MEDICATION  *UNUSUAL SHORTNESS OF BREATH  *UNUSUAL BRUISING OR BLEEDING  TENDERNESS IN MOUTH AND THROAT WITH OR WITHOUT PRESENCE OF ULCERS  *URINARY PROBLEMS  *BOWEL PROBLEMS  UNUSUAL RASH Items with * indicate a potential emergency and should be followed up as soon as possible.  Feel free to call the clinic should you have any questions or concerns. The clinic phone number is [phone].  Please show the [place] at check-in to the Emergency Department and triage nurse.

## 2020-07-12 NOTE — Assessment & Plan Note (Signed)
05/22/2019:Patient palpated a lump in the right breast. Mammogram showed 2.2cm mass in the UOQ, with no right axillary adenopathy. 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 Stage 2A 2.Adjuvant chemotherapy with [place] Perjetax6 started 9/14/2020completed 12/28/2020followed by Herceptin Perjeta maintenance 3.Adjuvant antiestrogen therapy with anastrozole 1 mg daily x7 years S1714: Taxane neuropathy study: No adverse effects due to participation in the study -------------------------------------------------------------------------------------------------------------------------- Treatment plan: Herceptin Perjeta maintenancealong with anastrozole (to be started 12/14/2019), Herceptin Perjeta will be completed 08/01/2020.  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 3 times daily Anastrozole toxicities: Muscle cramps  I will see her with the last Herceptin infusion

## 2020-07-12 NOTE — Progress Notes (Signed)
Patient Care Team: Earney Mallet, MD as PCP - General (Family Medicine) [name], RN as Oncology Nurse Navigator Rockwell Germany, RN as Oncology Nurse Navigator [name], MD as Consulting Physician (Hematology and Oncology) [name], MD as Consulting Physician (General Surgery) [name], MD as Attending Physician (Radiation Oncology)  DIAGNOSIS:    ICD-10-CM   1. Malignant neoplasm of upper-outer quadrant of right breast in female, estrogen receptor positive ([place])  C50.411    Z17.0     SUMMARY OF ONCOLOGIC HISTORY: Oncology History  Malignant neoplasm of upper-outer quadrant of right breast in female, estrogen receptor positive ([place])  1992 Miscellaneous   Left mastectomy followed by adjuvant chemotherapy   05/22/2019 Initial Diagnosis   Patient palpated a lump in the right breast. Mammogram showed 2.2cm mass in the UOQ, with no right axillary adenopathy. 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 The Common Hereditary Cancers Panel offered by [facility] includes sequencing and/or deletion duplication testing of the following 48 genes: APC, ATM, AXIN2, BARD1, BMPR1A, BRCA1, BRCA2, BRIP1, CDH1, CDKN2A (p14ARF), CDKN2A (p16INK4a), CKD4, CHEK2, CTNNA1, DICER1, EPCAM (Deletion/duplication testing only), GREM1 (promoter region deletion/duplication testing only), KIT, MEN1, MLH1, MSH2, MSH3, MSH6, MUTYH, NBN, NF1, NHTL1, PALB2, PDGFRA, PMS2, POLD1, POLE, PTEN, RAD50, RAD51C, RAD51D, RNF43, SDHB, SDHC, SDHD, SMAD4, SMARCA4. STK11, TP53, TSC1,  TSC2, and VHL.  The following genes were evaluated for sequence changes only: SDHA and HOXB13 c.251G>A variant only. The report date is 06/18/2019.   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   07/17/2019 Cancer Staging   Staging form: Breast, AJCC 8th Edition - Pathologic: Stage IIA (pT2, pN0, cM0, G3, ER+, PR-, HER2+) - Signed by [name], MD on 07/17/2019   08/10/2019 - 12/13/2019 Chemotherapy   The patient had palonosetron (ALOXI) injection 0.25 mg, 0.25 mg, Intravenous,  Once, 6 of 6 cycles Administration: 0.25 mg (08/10/2019), 0.25 mg (08/31/2019), 0.25 mg (11/02/2019), 0.25 mg (11/23/2019), 0.25 mg (09/21/2019), 0.25 mg (10/12/2019) pegfilgrastim-jmdb (FULPHILA) injection 6 mg, 6 mg, Subcutaneous,  Once, 6 of 6 cycles Administration: 6 mg (08/12/2019), 6 mg (09/02/2019), 6 mg (11/04/2019), 6 mg (11/25/2019), 6 mg (09/23/2019), 6 mg (10/14/2019) CARBOplatin (PARAPLATIN) 600 mg in sodium chloride 0.9 % 250 mL chemo infusion, 600 mg (99.3 % of original dose 604.2 mg), Intravenous,  Once, 6 of 6 cycles Dose modification:   (original dose 604.2 mg, Cycle 1), 600 mg (original dose 604.2 mg, Cycle 1),   (original dose 604.2 mg, Cycle 2, Reason: Provider Judgment) Administration: 600 mg (08/10/2019), 500 mg (08/31/2019), 500 mg (11/02/2019), 500 mg (11/23/2019), 500 mg (09/21/2019), 500 mg (10/12/2019) DOCEtaxel (TAXOTERE) 140 mg in sodium chloride 0.9 % 250 mL chemo infusion, 65 mg/m2 = 140 mg (86.7 % of original dose 75 mg/m2), Intravenous,  Once, 6 of 6 cycles Dose modification: 65 mg/m2 (original dose 75 mg/m2, Cycle 1, Reason: Provider Judgment), 50 mg/m2 (original dose 75 mg/m2, Cycle 2, Reason: Provider Judgment), 60 mg/m2 (original dose 75 mg/m2, Cycle 2, Reason:  Dose not tolerated) Administration: 140 mg (08/10/2019), 130 mg (08/31/2019), 130 mg (11/02/2019), 130 mg (11/23/2019), 130 mg  (09/21/2019), 130 mg (10/12/2019) pertuzumab (PERJETA) 420 mg in sodium chloride 0.9 % 250 mL chemo infusion, 420 mg (50 % of original dose 840 mg), Intravenous, Once, 6 of 6 cycles Dose modification: 420 mg (original dose 840 mg, Cycle 1, Reason: Provider Judgment) Administration: 420 mg (08/10/2019), 420 mg (08/31/2019), 420 mg (11/02/2019), 420 mg (11/23/2019), 420 mg (09/21/2019), 420 mg (10/12/2019) fosaprepitant (EMEND) 150 mg, dexamethasone (DECADRON) 12 mg in sodium chloride 0.9 % 145 mL IVPB, , Intravenous,  Once, 6 of 6 cycles Administration:  (08/10/2019),  (08/31/2019),  (11/02/2019),  (11/23/2019),  (09/21/2019),  (10/12/2019) trastuzumab-dkst (OGIVRI) 750 mg in sodium chloride 0.9 % 250 mL chemo infusion, 714 mg, Intravenous,  Once, 6 of 6 cycles Administration: 750 mg (08/10/2019), 600 mg (08/31/2019), 546 mg (11/02/2019), 546 mg (11/23/2019), 600 mg (09/21/2019), 546 mg (10/12/2019)  for chemotherapy treatment.    12/14/2019 -  Chemotherapy   The patient had pertuzumab (PERJETA) 420 mg in sodium chloride 0.9 % 250 mL chemo infusion, 420 mg, Intravenous, Once, 10 of 12 cycles Administration: 420 mg (12/14/2019), 420 mg (03/07/2020), 420 mg (03/28/2020), 420 mg (05/31/2020), 420 mg (06/20/2020), 420 mg (01/04/2020), 420 mg (01/25/2020), 420 mg (02/15/2020), 420 mg (04/18/2020), 420 mg (05/09/2020) trastuzumab-dkst (OGIVRI) 546 mg in sodium chloride 0.9 % 250 mL chemo infusion, 6 mg/kg = 546 mg, Intravenous,  Once, 10 of 12 cycles Administration: 546 mg (12/14/2019), 546 mg (03/07/2020), 546 mg (03/28/2020), 546 mg (05/31/2020), 546 mg (06/20/2020), 546 mg (01/04/2020), 546 mg (01/25/2020), 546 mg (02/15/2020), 546 mg (04/18/2020), 546 mg (05/09/2020)  for chemotherapy treatment.      CHIEF COMPLIANT: Follow-up of Herceptin Perjeta maintenance  INTERVAL HISTORY: Marisa Spears is a 69 y.o. with above-mentioned history of HER-2 positive right breast cancerwho underwent a mastectomy, adjuvant chemotherapy, andis currently  on Herceptin Perjeta maintenance.Echo on 07/06/20 showed an ejection fraction of 60-65%. She presents to the clinic todayfortreatment.The diarrhea is under very good control.  She has not taken any antidiarrheal medications.  ALLERGIES:  is allergic to codeine.  MEDICATIONS:  Current Outpatient Medications  Medication Sig Dispense Refill  . anastrozole (ARIMIDEX) 1 MG tablet Take 1 tablet (1 mg total) by mouth daily. (Patient not taking: Reported on 11/23/2019) 90 tablet 3  . aspirin EC 81 MG tablet Take 81 mg by mouth at bedtime.     . Biotin w/ Vitamins C & E (HAIR/SKIN/NAILS PO) Take 2 tablets by mouth daily.    . Calcium Carb-Cholecalciferol (CALCIUM 600+D3 PO) Take 1 tablet by mouth daily.    . Cholecalciferol (EQL VITAMIN D3) 50 MCG (2000 UT) CAPS Take 4,000 Units by mouth daily.    . diphenoxylate-atropine (LOMOTIL) 2.5-0.025 MG tablet Take 1 tablet by mouth 2 (two) times daily. 60 tablet 3  . gabapentin (NEURONTIN) 100 MG capsule Take 1 capsule (100 mg total) by mouth 3 (three) times daily. 270 capsule 3  . lidocaine-prilocaine (EMLA) cream APPLY CREAM TOPICALLY TO AFFECTED AREA ONCE    . LORazepam (ATIVAN) 0.5 MG tablet Take 1 tablet (0.5 mg total) by mouth at bedtime as needed. for sleep 30 tablet 2  . methocarbamol (ROBAXIN) 500 MG tablet Take 1 tablet (500 mg total) by mouth every 6 (six) hours as needed for muscle spasms. (Patient not taking: Reported on 11/23/2019) 30 tablet 1  . Omega-3 Fatty Acids (FISH OIL ULTRA) 1400 MG CAPS Take 1,400 mg by mouth 2 (  two) times daily.    . pravastatin (PRAVACHOL) 40 MG tablet Take 40 mg by mouth at bedtime.     . traMADol (ULTRAM) 50 MG tablet Take 1 tablet (50 mg total) by mouth every 6 (six) hours as needed (mild pain). (Patient not taking: Reported on 11/23/2019) 30 tablet 0  . triamterene-hydrochlorothiazide (MAXZIDE-25) 37.5-25 MG tablet Take 1 tablet by mouth daily.     Marland Kitchen venlafaxine XR (EFFEXOR-XR) 37.5 MG 24 hr capsule TAKE 1  CAPSULE BY MOUTH ONCE DAILY WITH BREAKFAST 90 capsule 3   No current facility-administered medications for this visit.    PHYSICAL EXAMINATION: ECOG PERFORMANCE STATUS: 1 - Symptomatic but completely ambulatory  Vitals:   07/12/20 1124  BP: (!) 148/86  Pulse: (!) 59  Resp: 20  Temp: 97.6 F (36.4 C)  SpO2: 100%   Filed Weights   07/12/20 1124  Weight: 203 lb 3.2 oz (92.2 kg)    LABORATORY DATA:  I have reviewed the data as listed CMP Latest Ref Rng & Units 05/31/2020 04/18/2020 03/07/2020  Glucose 70 - 99 mg/dL 100(H) 118(H) 172(H)  BUN 8 - 23 mg/dL '9 11 10  '$ Creatinine 0.44 - 1.00 mg/dL 0.79 0.90 0.83  Sodium 135 - 145 mmol/L 140 139 140  Potassium 3.5 - 5.1 mmol/L 3.8 4.0 3.6  Chloride 98 - 111 mmol/L 105 101 103  CO2 22 - 32 mmol/L '26 27 25  '$ Calcium 8.9 - 10.3 mg/dL 9.9 9.8 9.5  Total Protein 6.5 - 8.1 g/dL 7.3 7.6 7.6  Total Bilirubin 0.3 - 1.2 mg/dL 0.7 0.5 0.6  Alkaline Phos 38 - 126 U/L 68 65 75  AST 15 - 41 U/L '23 18 16  '$ ALT 0 - 44 U/L '16 14 13    '$ Lab Results  Component Value Date   WBC 6.8 07/12/2020   HGB 12.9 07/12/2020   HCT 38.1 07/12/2020   MCV 90.1 07/12/2020   PLT 203 07/12/2020   NEUTROABS 4.6 07/12/2020    ASSESSMENT & PLAN:  Malignant neoplasm of upper-outer quadrant of right breast in female, estrogen receptor positive ([place]) 05/22/2019:Patient palpated a lump in the right breast. Mammogram showed 2.2cm mass in the UOQ, with no right axillary adenopathy. 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 Stage 2A 2.Adjuvant chemotherapy with [place] Perjetax6 started 9/14/2020completed 12/28/2020followed by  Herceptin Perjeta maintenance 3.Adjuvant antiestrogen therapy with anastrozole 1 mg daily x7 years [id number]: Taxane neuropathy study: No adverse effects due to participation in the study -------------------------------------------------------------------------------------------------------------------------- Treatment plan: Herceptin Perjeta maintenancealong with anastrozole (to be started 12/14/2019), Herceptin Perjeta will be completed 08/01/2020.  Toxicities: 1.Diarrhea:Improved 2.Anxiety and depression: On Effexor 3.Chemo-induced peripheral neuropathy: On gabapentin, increase her gabapentin dose to 300 mg 3 times daily it is improving slowly. Previous anemia: Resolved  Peripheral neuropathy: Currently on gabapentin 200 mg p.o. 3 times daily Anastrozole toxicities: Muscle cramps I discussed with her about neratinib and I provided her with literature on that. I will see her with the last Herceptin infusion  After the last dose of Herceptin I will see her back in 6 months to make a decision on neratinib.  No orders of the defined types were placed in this encounter.  The patient has a good understanding of the  overall plan. she agrees with it. she will call with any problems that may develop before the next visit here.  Total time spent: 30 mins including face to face time and time spent for planning, charting and coordination of care  [name], MD 07/12/2020  I, Cloyde Reams Dorshimer, am acting as scribe for Dr. [name].  I have reviewed the above documentation for accuracy and completeness, and I agree with the above.

## 2020-07-13 ENCOUNTER — Telehealth: Payer: Self-pay | Admitting: Hematology and Oncology

## 2020-07-13 NOTE — Telephone Encounter (Signed)
No 8/17 los, no changes made to pt schedule

## 2020-07-26 ENCOUNTER — Ambulatory Visit: Payer: Self-pay | Admitting: General Surgery

## 2020-08-01 NOTE — Progress Notes (Signed)
Patient Care Team: Earney Mallet, MD as PCP - General (Family Medicine) [name], RN as Oncology Nurse Navigator Rockwell Germany, RN as Oncology Nurse Navigator [name], MD as Consulting Physician (Hematology and Oncology) [name], MD as Consulting Physician (General Surgery) [name], MD as Attending Physician (Radiation Oncology)  DIAGNOSIS:    ICD-10-CM   1. Malignant neoplasm of upper-outer quadrant of right breast in female, estrogen receptor positive ([place])  C50.411    Z17.0     SUMMARY OF ONCOLOGIC HISTORY: Oncology History  Malignant neoplasm of upper-outer quadrant of right breast in female, estrogen receptor positive ([place])  1992 Miscellaneous   Left mastectomy followed by adjuvant chemotherapy   05/22/2019 Initial Diagnosis   Patient palpated a lump in the right breast. Mammogram showed 2.2cm mass in the UOQ, with no right axillary adenopathy. 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 The Common Hereditary Cancers Panel offered by [facility] includes sequencing and/or deletion duplication testing of the following 48 genes: APC, ATM, AXIN2, BARD1, BMPR1A, BRCA1, BRCA2, BRIP1, CDH1, CDKN2A (p14ARF), CDKN2A (p16INK4a), CKD4, CHEK2, CTNNA1, DICER1, EPCAM (Deletion/duplication testing only), GREM1 (promoter region deletion/duplication testing only), KIT, MEN1, MLH1, MSH2, MSH3, MSH6, MUTYH, NBN, NF1, NHTL1, PALB2, PDGFRA, PMS2, POLD1, POLE, PTEN, RAD50, RAD51C, RAD51D, RNF43, SDHB, SDHC, SDHD, SMAD4, SMARCA4. STK11, TP53, TSC1,  TSC2, and VHL.  The following genes were evaluated for sequence changes only: SDHA and HOXB13 c.251G>A variant only. The report date is 06/18/2019.   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   07/17/2019 Cancer Staging   Staging form: Breast, AJCC 8th Edition - Pathologic: Stage IIA (pT2, pN0, cM0, G3, ER+, PR-, HER2+) - Signed by [name], MD on 07/17/2019   08/10/2019 - 12/13/2019 Chemotherapy   The patient had palonosetron (ALOXI) injection 0.25 mg, 0.25 mg, Intravenous,  Once, 6 of 6 cycles Administration: 0.25 mg (08/10/2019), 0.25 mg (08/31/2019), 0.25 mg (11/02/2019), 0.25 mg (11/23/2019), 0.25 mg (09/21/2019), 0.25 mg (10/12/2019) pegfilgrastim-jmdb (FULPHILA) injection 6 mg, 6 mg, Subcutaneous,  Once, 6 of 6 cycles Administration: 6 mg (08/12/2019), 6 mg (09/02/2019), 6 mg (11/04/2019), 6 mg (11/25/2019), 6 mg (09/23/2019), 6 mg (10/14/2019) CARBOplatin (PARAPLATIN) 600 mg in sodium chloride 0.9 % 250 mL chemo infusion, 600 mg (99.3 % of original dose 604.2 mg), Intravenous,  Once, 6 of 6 cycles Dose modification:   (original dose 604.2 mg, Cycle 1), 600 mg (original dose 604.2 mg, Cycle 1),   (original dose 604.2 mg, Cycle 2, Reason: Provider Judgment) Administration: 600 mg (08/10/2019), 500 mg (08/31/2019), 500 mg (11/02/2019), 500 mg (11/23/2019), 500 mg (09/21/2019), 500 mg (10/12/2019) DOCEtaxel (TAXOTERE) 140 mg in sodium chloride 0.9 % 250 mL chemo infusion, 65 mg/m2 = 140 mg (86.7 % of original dose 75 mg/m2), Intravenous,  Once, 6 of 6 cycles Dose modification: 65 mg/m2 (original dose 75 mg/m2, Cycle 1, Reason: Provider Judgment), 50 mg/m2 (original dose 75 mg/m2, Cycle 2, Reason: Provider Judgment), 60 mg/m2 (original dose 75 mg/m2, Cycle 2, Reason:  Dose not tolerated) Administration: 140 mg (08/10/2019), 130 mg (08/31/2019), 130 mg (11/02/2019), 130 mg (11/23/2019), 130 mg  (09/21/2019), 130 mg (10/12/2019) pertuzumab (PERJETA) 420 mg in sodium chloride 0.9 % 250 mL chemo infusion, 420 mg (50 % of original dose 840 mg), Intravenous, Once, 6 of 6 cycles Dose modification: 420 mg (original dose 840 mg, Cycle 1, Reason: Provider Judgment) Administration: 420 mg (08/10/2019), 420 mg (08/31/2019), 420 mg (11/02/2019), 420 mg (11/23/2019), 420 mg (09/21/2019), 420 mg (10/12/2019) fosaprepitant (EMEND) 150 mg, dexamethasone (DECADRON) 12 mg in sodium chloride 0.9 % 145 mL IVPB, , Intravenous,  Once, 6 of 6 cycles Administration:  (08/10/2019),  (08/31/2019),  (11/02/2019),  (11/23/2019),  (09/21/2019),  (10/12/2019) trastuzumab-dkst (OGIVRI) 750 mg in sodium chloride 0.9 % 250 mL chemo infusion, 714 mg, Intravenous,  Once, 6 of 6 cycles Administration: 750 mg (08/10/2019), 600 mg (08/31/2019), 546 mg (11/02/2019), 546 mg (11/23/2019), 600 mg (09/21/2019), 546 mg (10/12/2019)  for chemotherapy treatment.    12/14/2019 -  Chemotherapy   The patient had pertuzumab (PERJETA) 420 mg in sodium chloride 0.9 % 250 mL chemo infusion, 420 mg, Intravenous, Once, 11 of 12 cycles Administration: 420 mg (12/14/2019), 420 mg (03/07/2020), 420 mg (03/28/2020), 420 mg (05/31/2020), 420 mg (06/20/2020), 420 mg (01/04/2020), 420 mg (01/25/2020), 420 mg (02/15/2020), 420 mg (04/18/2020), 420 mg (05/09/2020), 420 mg (07/12/2020) trastuzumab-dkst (OGIVRI) 546 mg in sodium chloride 0.9 % 250 mL chemo infusion, 6 mg/kg = 546 mg, Intravenous,  Once, 11 of 12 cycles Administration: 546 mg (12/14/2019), 546 mg (03/07/2020), 546 mg (03/28/2020), 546 mg (05/31/2020), 546 mg (06/20/2020), 546 mg (01/04/2020), 546 mg (01/25/2020), 546 mg (02/15/2020), 546 mg (04/18/2020), 546 mg (05/09/2020), 546 mg (07/12/2020)  for chemotherapy treatment.      CHIEF COMPLIANT: Follow-up of Herceptin Perjeta maintenance  INTERVAL HISTORY: Marisa Spears is a 69 y.o. with above-mentioned history of HER-2 positive right breast cancerwho underwent a  mastectomy,adjuvantchemotherapy, andis currently on Herceptin Perjeta maintenance. She presents to the clinic todayfortreatment.  ALLERGIES:  is allergic to codeine.  MEDICATIONS:  Current Outpatient Medications  Medication Sig Dispense Refill  . anastrozole (ARIMIDEX) 1 MG tablet Take 1 tablet (1 mg total) by mouth daily. (Patient not taking: Reported on 11/23/2019) 90 tablet 3  . aspirin EC 81 MG tablet Take 81 mg by mouth at bedtime.     . Biotin w/ Vitamins C & E (HAIR/SKIN/NAILS PO) Take 2 tablets by mouth daily.    . Calcium Carb-Cholecalciferol (CALCIUM 600+D3 PO) Take 1 tablet by mouth daily.    . Cholecalciferol (EQL VITAMIN D3) 50 MCG (2000 UT) CAPS Take 4,000 Units by mouth daily.    Marland Kitchen gabapentin (NEURONTIN) 300 MG capsule Take 1 capsule (300 mg total) by mouth 3 (three) times daily. 90 capsule 3  . lidocaine-prilocaine (EMLA) cream APPLY CREAM TOPICALLY TO AFFECTED AREA ONCE    . LORazepam (ATIVAN) 0.5 MG tablet Take 1 tablet (0.5 mg total) by mouth at bedtime as needed. for sleep 30 tablet 2  . Omega-3 Fatty Acids (FISH OIL ULTRA) 1400 MG CAPS Take 1,400 mg by mouth 2 (two) times daily.    . pravastatin (PRAVACHOL) 40 MG tablet Take 40 mg by mouth at bedtime.     . triamterene-hydrochlorothiazide (MAXZIDE-25) 37.5-25 MG tablet Take 1 tablet by mouth daily.     Marland Kitchen venlafaxine XR (EFFEXOR-XR) 37.5 MG 24 hr capsule TAKE 1 CAPSULE BY MOUTH ONCE DAILY WITH BREAKFAST 90 capsule 3   No current facility-administered medications for this visit.  PHYSICAL EXAMINATION: ECOG PERFORMANCE STATUS: 1 - Symptomatic but completely ambulatory  There were no vitals filed for this visit. There were no vitals filed for this visit.  LABORATORY DATA:  I have reviewed the data as listed CMP Latest Ref Rng & Units 07/12/2020 05/31/2020 04/18/2020  Glucose 70 - 99 mg/dL 124(H) 100(H) 118(H)  BUN 8 - 23 mg/dL _0 Creatinine 0.44 - 1.00 mg/dL 0.79 0.79 0.90  Sodium 135 - 145 mmol/L 139 140  139  Potassium 3.5 - 5.1 mmol/L 4.0 3.8 4.0  Chloride 98 - 111 mmol/L 103 105 101  CO2 22 - 32 mmol/L _1 Calcium 8.9 - 10.3 mg/dL 10.0 9.9 9.8  Total Protein 6.5 - 8.1 g/dL 7.3 7.3 7.6  Total Bilirubin 0.3 - 1.2 mg/dL 0.7 0.7 0.5  Alkaline Phos 38 - 126 U/L 70 68 65  AST 15 - 41 U/L _2 ALT 0 - 44 U/L _3 Lab Results  Component Value Date   WBC 6.8 07/12/2020   HGB 12.9 07/12/2020   HCT 38.1 07/12/2020   MCV 90.1 07/12/2020   PLT 203 07/12/2020   NEUTROABS 4.6 07/12/2020    ASSESSMENT & PLAN:  Malignant neoplasm of upper-outer quadrant of right breast in female, estrogen receptor positive ([place]) 05/22/2019:Patient palpated a lump in the right breast. Mammogram showed 2.2cm mass in the UOQ, with no right axillary adenopathy. 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 Stage 2A 2.Adjuvant chemotherapy with [place] Perjetax6 started 9/14/2020completed 12/28/2020followed by Herceptin Perjeta maintenance 3.Adjuvant antiestrogen therapy with anastrozole 1 mg daily x7 years [id number]: Taxane neuropathy study: No adverse effects due to participation in the study -------------------------------------------------------------------------------------------------------------------------- Treatment plan: Herceptin Perjeta maintenancealong with anastrozole (to be started 12/14/2019), Herceptin Perjeta  completed 08/02/2020.  Toxicities: 1.Diarrhea:Improved 2.Anxiety and depression:On Effexor 3.Chemo-induced peripheral neuropathy:On gabapentin, increase her gabapentin dose to 300 mg 3 times daily it is improving  slowly. Previous anemia: Resolved  Peripheral neuropathy: Currently on gabapentin 200 mg p.o. 3 times daily Anastrozole toxicities: Muscle cramps   Today's last infusion of Herceptin.  We will ask her port to be removed.  We will see her back in 6 months to make a decision on neratinib.  With her previous problems with diarrhea, she is not interested in an neratinib.    No orders of the defined types were placed in this encounter.  The patient has a good understanding of the overall plan. she agrees with it. she will call with any problems that may develop before the next visit here.  Total time spent: 30 mins including face to face time and time spent for planning, charting and coordination of care  [name], MD 08/02/2020  I, Cloyde Reams Dorshimer, am acting as scribe for Dr. [name].  I have reviewed the above documentation for accuracy and completeness, and I agree with the above.

## 2020-08-02 ENCOUNTER — Encounter: Payer: Self-pay | Admitting: *Deleted

## 2020-08-02 ENCOUNTER — Other Ambulatory Visit: Payer: Self-pay

## 2020-08-02 ENCOUNTER — Inpatient Hospital Stay: Payer: Medicare Other | Attending: Hematology and Oncology

## 2020-08-02 ENCOUNTER — Inpatient Hospital Stay (HOSPITAL_BASED_OUTPATIENT_CLINIC_OR_DEPARTMENT_OTHER): Payer: Medicare Other | Admitting: Hematology and Oncology

## 2020-08-02 VITALS — Temp 97.9°F

## 2020-08-02 DIAGNOSIS — C50411 Malignant neoplasm of upper-outer quadrant of right female breast: Secondary | ICD-10-CM | POA: Diagnosis present

## 2020-08-02 DIAGNOSIS — Z79899 Other long term (current) drug therapy: Secondary | ICD-10-CM | POA: Insufficient documentation

## 2020-08-02 DIAGNOSIS — Z5112 Encounter for antineoplastic immunotherapy: Secondary | ICD-10-CM | POA: Insufficient documentation

## 2020-08-02 DIAGNOSIS — Z170 Estrogen receptor positive status [ER+]: Secondary | ICD-10-CM

## 2020-08-02 MED ORDER — DIPHENHYDRAMINE HCL 25 MG PO CAPS
ORAL_CAPSULE | ORAL | Status: AC
  Filled 2020-08-02: qty 1

## 2020-08-02 MED ORDER — ACETAMINOPHEN 325 MG PO TABS
ORAL_TABLET | ORAL | Status: AC
  Filled 2020-08-02: qty 2

## 2020-08-02 MED ORDER — DIPHENHYDRAMINE HCL 25 MG PO CAPS
25.0000 mg | ORAL_CAPSULE | Freq: Once | ORAL | Status: AC
  Administered 2020-08-02: 25 mg via ORAL

## 2020-08-02 MED ORDER — SODIUM CHLORIDE 0.9 % IV SOLN
420.0000 mg | Freq: Once | INTRAVENOUS | Status: AC
  Administered 2020-08-02: 420 mg via INTRAVENOUS
  Filled 2020-08-02: qty 14

## 2020-08-02 MED ORDER — HEPARIN SOD (PORK) LOCK FLUSH 100 UNIT/ML IV SOLN
500.0000 [IU] | Freq: Once | INTRAVENOUS | Status: AC | PRN
  Administered 2020-08-02: 500 [IU]
  Filled 2020-08-02: qty 5

## 2020-08-02 MED ORDER — ACETAMINOPHEN 325 MG PO TABS
650.0000 mg | ORAL_TABLET | Freq: Once | ORAL | Status: AC
  Administered 2020-08-02: 650 mg via ORAL

## 2020-08-02 MED ORDER — SODIUM CHLORIDE 0.9% FLUSH
10.0000 mL | INTRAVENOUS | Status: DC | PRN
  Administered 2020-08-02: 10 mL
  Filled 2020-08-02: qty 10

## 2020-08-02 MED ORDER — TRASTUZUMAB-DKST CHEMO 150 MG IV SOLR
6.0000 mg/kg | Freq: Once | INTRAVENOUS | Status: AC
  Administered 2020-08-02: 546 mg via INTRAVENOUS
  Filled 2020-08-02: qty 26

## 2020-08-02 MED ORDER — SODIUM CHLORIDE 0.9 % IV SOLN
Freq: Once | INTRAVENOUS | Status: AC
  Filled 2020-08-02: qty 250

## 2020-08-02 NOTE — Patient Instructions (Signed)
[facility] Discharge Instructions for Patients Receiving Chemotherapy  Today you received the following chemotherapy agents: Trastuzumab and Perjeta  To help prevent nausea and vomiting after your treatment, we encourage you to take your nausea medication as prescribed.    If you develop nausea and vomiting that is not controlled by your nausea medication, call the clinic.   BELOW ARE SYMPTOMS THAT SHOULD BE REPORTED IMMEDIATELY:  *FEVER GREATER THAN 100.5 F  *CHILLS WITH OR WITHOUT FEVER  NAUSEA AND VOMITING THAT IS NOT CONTROLLED WITH YOUR NAUSEA MEDICATION  *UNUSUAL SHORTNESS OF BREATH  *UNUSUAL BRUISING OR BLEEDING  TENDERNESS IN MOUTH AND THROAT WITH OR WITHOUT PRESENCE OF ULCERS  *URINARY PROBLEMS  *BOWEL PROBLEMS  UNUSUAL RASH Items with * indicate a potential emergency and should be followed up as soon as possible.  Feel free to call the clinic should you have any questions or concerns. The clinic phone number is [phone].  Please show the [facility] at check-in to the Emergency Department and triage nurse.

## 2020-08-02 NOTE — Assessment & Plan Note (Signed)
05/22/2019:Patient palpated a lump in the right breast. Mammogram showed 2.2cm mass in the UOQ, with no right axillary adenopathy. 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 Stage 2A 2.Adjuvant chemotherapy with [place] Perjetax6 started 9/14/2020completed 12/28/2020followed by Herceptin Perjeta maintenance 3.Adjuvant antiestrogen therapy with anastrozole 1 mg daily x7 years [id number]: Taxane neuropathy study: No adverse effects due to participation in the study -------------------------------------------------------------------------------------------------------------------------- Treatment plan: Herceptin Perjeta maintenancealong with anastrozole (to be started 12/14/2019), Herceptin Perjeta  completed 08/02/2020.  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 Previous anemia: Resolved  Peripheral neuropathy: Currently on gabapentin 200 mg p.o. 3 times daily Anastrozole toxicities: Muscle cramps   Today's last infusion of Herceptin.  We will ask her port to be removed.  We will see her back in 6 months to make a decision on neratinib.

## 2020-08-03 ENCOUNTER — Telehealth: Payer: Self-pay | Admitting: Hematology and Oncology

## 2020-08-03 NOTE — Telephone Encounter (Signed)
Scheduled per 9/7 los. Called and spoke with pt, confirmed 3/7 appt

## 2020-09-09 ENCOUNTER — Other Ambulatory Visit: Payer: Self-pay

## 2020-09-09 ENCOUNTER — Encounter (HOSPITAL_BASED_OUTPATIENT_CLINIC_OR_DEPARTMENT_OTHER): Payer: Self-pay | Admitting: General Surgery

## 2020-09-13 ENCOUNTER — Other Ambulatory Visit (HOSPITAL_COMMUNITY)
Admission: RE | Admit: 2020-09-13 | Discharge: 2020-09-13 | Disposition: A | Discharge status: Home / Self Care | Payer: Medicare Other | Source: Ambulatory Visit | Attending: General Surgery | Admitting: General Surgery

## 2020-09-13 ENCOUNTER — Encounter (HOSPITAL_BASED_OUTPATIENT_CLINIC_OR_DEPARTMENT_OTHER)
Admission: RE | Admit: 2020-09-13 | Discharge: 2020-09-13 | Disposition: A | Discharge status: Home / Self Care | Payer: Medicare Other | Source: Ambulatory Visit | Attending: General Surgery | Admitting: General Surgery

## 2020-09-13 DIAGNOSIS — Z20822 Contact with and (suspected) exposure to covid-19: Secondary | ICD-10-CM | POA: Insufficient documentation

## 2020-09-13 DIAGNOSIS — Z01812 Encounter for preprocedural laboratory examination: Secondary | ICD-10-CM | POA: Insufficient documentation

## 2020-09-13 LAB — BASIC METABOLIC PANEL
Anion gap: 12 (ref 5–15)
BUN: 15 mg/dL (ref 8–23)
CO2: 25 mmol/L (ref 22–32)
Calcium: 9.8 mg/dL (ref 8.9–10.3)
Chloride: 102 mmol/L (ref 98–111)
Creatinine, Ser: 0.71 mg/dL (ref 0.44–1.00)
GFR, Estimated: >60 mL/min (ref >60)
Glucose, Bld: 127 mg/dL — ABNORMAL HIGH (ref 70–99)
Potassium: 4.5 mmol/L (ref 3.5–5.1)
Sodium: 139 mmol/L (ref 135–145)

## 2020-09-13 LAB — SARS CORONAVIRUS 2 (TAT 6-24 HRS): SARS Coronavirus 2: NEGATIVE

## 2020-09-13 NOTE — Progress Notes (Signed)

## 2020-09-14 ENCOUNTER — Telehealth: Payer: Self-pay | Admitting: *Deleted

## 2020-09-14 NOTE — Telephone Encounter (Signed)
Received call from pt with complaint of bilateral arm "deep itching sensation" x several weeks unrelieved by p.o. benadryl.  Pt denies recent change in laundry detergent.  Per MD pt to stop anastrozole x 2 weeks and follow up in the clinic.  Pt also advised to apply topical OTC hydrocortisone or benadryl cream and to take  Zyrtec or Claritin OTC as directed on the back of the box. Pt verbalized understanding.

## 2020-09-16 ENCOUNTER — Ambulatory Visit (HOSPITAL_BASED_OUTPATIENT_CLINIC_OR_DEPARTMENT_OTHER): Payer: Medicare Other | Admitting: Anesthesiology

## 2020-09-16 ENCOUNTER — Ambulatory Visit (HOSPITAL_BASED_OUTPATIENT_CLINIC_OR_DEPARTMENT_OTHER)
Admission: RE | Admit: 2020-09-16 | Discharge: 2020-09-16 | Disposition: A | Discharge status: Home / Self Care | Payer: Medicare Other | Attending: General Surgery | Admitting: General Surgery

## 2020-09-16 ENCOUNTER — Encounter (HOSPITAL_BASED_OUTPATIENT_CLINIC_OR_DEPARTMENT_OTHER): Payer: Self-pay | Admitting: General Surgery

## 2020-09-16 ENCOUNTER — Other Ambulatory Visit: Payer: Self-pay

## 2020-09-16 ENCOUNTER — Encounter (HOSPITAL_BASED_OUTPATIENT_CLINIC_OR_DEPARTMENT_OTHER)
Admission: RE | Disposition: A | Discharge status: Home / Self Care | Payer: Self-pay | Source: Home / Self Care | Attending: General Surgery

## 2020-09-16 DIAGNOSIS — Z853 Personal history of malignant neoplasm of breast: Secondary | ICD-10-CM | POA: Diagnosis not present

## 2020-09-16 DIAGNOSIS — Z885 Allergy status to narcotic agent status: Secondary | ICD-10-CM | POA: Insufficient documentation

## 2020-09-16 DIAGNOSIS — Z79811 Long term (current) use of aromatase inhibitors: Secondary | ICD-10-CM | POA: Insufficient documentation

## 2020-09-16 DIAGNOSIS — Z452 Encounter for adjustment and management of vascular access device: Secondary | ICD-10-CM | POA: Insufficient documentation

## 2020-09-16 DIAGNOSIS — Z79899 Other long term (current) drug therapy: Secondary | ICD-10-CM | POA: Insufficient documentation

## 2020-09-16 DIAGNOSIS — Z886 Allergy status to analgesic agent status: Secondary | ICD-10-CM | POA: Insufficient documentation

## 2020-09-16 DIAGNOSIS — Z87891 Personal history of nicotine dependence: Secondary | ICD-10-CM | POA: Diagnosis not present

## 2020-09-16 DIAGNOSIS — I10 Essential (primary) hypertension: Secondary | ICD-10-CM | POA: Insufficient documentation

## 2020-09-16 DIAGNOSIS — Z9011 Acquired absence of right breast and nipple: Secondary | ICD-10-CM | POA: Insufficient documentation

## 2020-09-16 DIAGNOSIS — Z7982 Long term (current) use of aspirin: Secondary | ICD-10-CM | POA: Insufficient documentation

## 2020-09-16 DIAGNOSIS — M1990 Unspecified osteoarthritis, unspecified site: Secondary | ICD-10-CM | POA: Insufficient documentation

## 2020-09-16 HISTORY — PX: PORT-A-CATH REMOVAL: SHX5289

## 2020-09-16 SURGERY — REMOVAL PORT-A-CATH
Anesthesia: Monitor Anesthesia Care | Site: Chest | Laterality: Left

## 2020-09-16 MED ORDER — CELECOXIB 200 MG PO CAPS
ORAL_CAPSULE | ORAL | Status: AC
  Filled 2020-09-16: qty 1

## 2020-09-16 MED ORDER — GABAPENTIN 300 MG PO CAPS
ORAL_CAPSULE | ORAL | Status: AC
  Filled 2020-09-16: qty 1

## 2020-09-16 MED ORDER — LACTATED RINGERS IV SOLN: INTRAVENOUS | Status: DC

## 2020-09-16 MED ORDER — CHLORHEXIDINE GLUCONATE CLOTH 2 % EX PADS: 6.0000 | MEDICATED_PAD | Freq: Once | CUTANEOUS | Status: DC

## 2020-09-16 MED ORDER — CELECOXIB 200 MG PO CAPS
200.0000 mg | ORAL_CAPSULE | ORAL | Status: AC
  Administered 2020-09-16: 200 mg via ORAL

## 2020-09-16 MED ORDER — PROPOFOL 500 MG/50ML IV EMUL
INTRAVENOUS | Status: DC | PRN
  Administered 2020-09-16: 50 ug/kg/min via INTRAVENOUS

## 2020-09-16 MED ORDER — FENTANYL CITRATE (PF) 100 MCG/2ML IJ SOLN
INTRAMUSCULAR | Status: DC | PRN
  Administered 2020-09-16 (×2): 50 ug via INTRAVENOUS

## 2020-09-16 MED ORDER — GABAPENTIN 300 MG PO CAPS
300.0000 mg | ORAL_CAPSULE | ORAL | Status: AC
  Administered 2020-09-16: 300 mg via ORAL

## 2020-09-16 MED ORDER — ONDANSETRON HCL 4 MG/2ML IJ SOLN
INTRAMUSCULAR | Status: AC
  Filled 2020-09-16: qty 2

## 2020-09-16 MED ORDER — OXYCODONE HCL 5 MG/5ML PO SOLN: 5.0000 mg | Freq: Once | ORAL | Status: DC | PRN

## 2020-09-16 MED ORDER — FENTANYL CITRATE (PF) 100 MCG/2ML IJ SOLN: 25.0000 ug | INTRAMUSCULAR | Status: DC | PRN

## 2020-09-16 MED ORDER — TRAMADOL HCL 50 MG PO TABS: 50.0000 mg | ORAL_TABLET | Freq: Four times a day (QID) | ORAL | 0 refills | Status: DC | PRN

## 2020-09-16 MED ORDER — ACETAMINOPHEN 500 MG PO TABS
1000.0000 mg | ORAL_TABLET | ORAL | Status: AC
  Administered 2020-09-16: 1000 mg via ORAL

## 2020-09-16 MED ORDER — ONDANSETRON HCL 4 MG/2ML IJ SOLN
INTRAMUSCULAR | Status: DC | PRN
  Administered 2020-09-16: 4 mg via INTRAVENOUS

## 2020-09-16 MED ORDER — OXYCODONE HCL 5 MG PO TABS: 5.0000 mg | ORAL_TABLET | Freq: Once | ORAL | Status: DC | PRN

## 2020-09-16 MED ORDER — FENTANYL CITRATE (PF) 100 MCG/2ML IJ SOLN
INTRAMUSCULAR | Status: AC
  Filled 2020-09-16: qty 2

## 2020-09-16 MED ORDER — PROPOFOL 10 MG/ML IV BOLUS
INTRAVENOUS | Status: DC | PRN
  Administered 2020-09-16 (×2): 20 mg via INTRAVENOUS

## 2020-09-16 MED ORDER — ONDANSETRON HCL 4 MG/2ML IJ SOLN: 4.0000 mg | Freq: Four times a day (QID) | INTRAMUSCULAR | Status: DC | PRN

## 2020-09-16 MED ORDER — LIDOCAINE HCL (PF) 1 % IJ SOLN
INTRAMUSCULAR | Status: DC | PRN
  Administered 2020-09-16: 30 mL

## 2020-09-16 MED ORDER — BUPIVACAINE HCL (PF) 0.25 % IJ SOLN
INTRAMUSCULAR | Status: DC | PRN
  Administered 2020-09-16: 30 mL

## 2020-09-16 MED ORDER — PROPOFOL 10 MG/ML IV BOLUS
INTRAVENOUS | Status: AC
  Filled 2020-09-16: qty 20

## 2020-09-16 MED ORDER — ACETAMINOPHEN 500 MG PO TABS
ORAL_TABLET | ORAL | Status: AC
  Filled 2020-09-16: qty 2

## 2020-09-16 SURGICAL SUPPLY — 26 items
BLADE SURG 15 STRL LF DISP TIS (BLADE) ×1 IMPLANT
BLADE SURG 15 STRL SS (BLADE) ×2
CHLORAPREP W/TINT 26 (MISCELLANEOUS) ×3 IMPLANT
COVER BACK TABLE 60X90IN (DRAPES) ×3 IMPLANT
COVER MAYO STAND STRL (DRAPES) ×3 IMPLANT
COVER WAND RF STERILE (DRAPES) IMPLANT
DECANTER SPIKE VIAL GLASS SM (MISCELLANEOUS) ×3 IMPLANT
DERMABOND ADVANCED (GAUZE/BANDAGES/DRESSINGS) ×2
DERMABOND ADVANCED .7 DNX12 (GAUZE/BANDAGES/DRESSINGS) ×1 IMPLANT
DRAPE LAPAROTOMY 100X72 PEDS (DRAPES) ×3 IMPLANT
DRAPE UTILITY XL STRL (DRAPES) ×3 IMPLANT
ELECT COATED BLADE 2.86 ST (ELECTRODE) IMPLANT
ELECT REM PT RETURN 9FT ADLT (ELECTROSURGICAL) ×3
ELECTRODE REM PT RTRN 9FT ADLT (ELECTROSURGICAL) ×1 IMPLANT
GLOVE BIO SURGEON STRL SZ7.5 (GLOVE) ×9 IMPLANT
GOWN STRL REUS W/ TWL LRG LVL3 (GOWN DISPOSABLE) ×2 IMPLANT
GOWN STRL REUS W/TWL LRG LVL3 (GOWN DISPOSABLE) ×4
NEEDLE HYPO 25X1 1.5 SAFETY (NEEDLE) ×3 IMPLANT
PACK BASIN DAY SURGERY FS (CUSTOM PROCEDURE TRAY) ×3 IMPLANT
PENCIL SMOKE EVACUATOR (MISCELLANEOUS) ×3 IMPLANT
SLEEVE SCD COMPRESS KNEE MED (MISCELLANEOUS) IMPLANT
SUT MON AB 4-0 PC3 18 (SUTURE) ×3 IMPLANT
SUT VIC AB 3-0 SH 27 (SUTURE) ×2
SUT VIC AB 3-0 SH 27X BRD (SUTURE) ×1 IMPLANT
SYR CONTROL 10ML LL (SYRINGE) ×3 IMPLANT
TOWEL GREEN STERILE FF (TOWEL DISPOSABLE) ×3 IMPLANT

## 2020-09-16 NOTE — H&P (Signed)
Marisa Spears  Location: [facility] Patient #: [id number] DOB: 05-03-1951 Married / Language: English / Race: White Female   History of Present Illness The patient is a 69 year old female who presents for a follow-up for Breast cancer. The patient is a 69 year old white female who is about one year status post right modified radical mastectomy for a T2 N0 right breast cancer that was triple negative. She was originally ER positive and HER-2 positive with a Ki-67 of 80% prior to neoadjuvant treatment. She is still taking anastrozole as well as Herceptin. She seems to be tolerating it well. She denies any significant chest wall pain.   Allergies  Codeine Phosphate *ANALGESICS - OPIOID*  Nausea. Allergies Reconciled   Medication History Gabapentin (100MG Capsule, Oral) Active. Venlafaxine HCl ER (37.5MG Capsule ER 24HR, Oral) Active. Maxzide (Oral) Specific strength unknown - Active. Omega 3 (1000MG Capsule, Oral) Active. Lomotil (Oral) Specific strength unknown - Active. Vitamin D3 (50 MCG(2000 UT) Capsule, Oral) Active. Calcium 600 (1500 (600 Ca)MG Tablet, Oral) Active. Biotin (Oral) Specific strength unknown - Active. Aspirin (81MG Tablet DR, Oral) Active. Anastrozole (1MG Tablet, Oral) Active. Medications Reconciled    Review of Systems  General Not Present- Appetite Loss, Chills, Fatigue, Fever, Night Sweats, Weight Gain and Weight Loss. Skin Not Present- Change in Wart/Mole, Dryness, Hives, Jaundice, New Lesions, Non-Healing Wounds, Rash and Ulcer. HEENT Present- Wears glasses/contact lenses. Not Present- Earache, Hearing Loss, Hoarseness, Nose Bleed, Oral Ulcers, Ringing in the Ears, Seasonal Allergies, Sinus Pain, Sore Throat, Visual Disturbances and Yellow Eyes. Respiratory Not Present- Bloody sputum, Chronic Cough, Difficulty Breathing, Snoring and Wheezing. Cardiovascular Not Present- Chest Pain, Difficulty Breathing Lying Down, Leg Cramps,  Palpitations, Rapid Heart Rate, Shortness of Breath and Swelling of Extremities. Female Genitourinary Not Present- Frequency, Nocturia, Painful Urination, Pelvic Pain and Urgency. Musculoskeletal Not Present- Back Pain, Joint Pain, Joint Stiffness, Muscle Pain, Muscle Weakness and Swelling of Extremities. Neurological Not Present- Decreased Memory, Fainting, Headaches, Numbness, Seizures, Tingling, Tremor, Trouble walking and Weakness.  Vitals  Weight: 199.38 lb Height: 69in Body Surface Area: 2.06 m Body Mass Index: 29.44 kg/m  Temp.: 75F  Pulse: 93 (Regular)  BP: 128/82(Sitting, Left Arm, Standard)       Physical Exam General Mental Status-Alert. General Appearance-Consistent with stated age. Hydration-Well hydrated. Voice-Normal.  Head and Neck Head-normocephalic, atraumatic with no lesions or palpable masses. Trachea-midline. Thyroid Gland Characteristics - normal size and consistency.  Eye Eyeball - Bilateral-Extraocular movements intact. Sclera/Conjunctiva - Bilateral-No scleral icterus.  Chest and Lung Exam Chest and lung exam reveals -quiet, even and easy respiratory effort with no use of accessory muscles and on auscultation, normal breath sounds, no adventitious sounds and normal vocal resonance. Inspection Chest Wall - Normal. Back - normal.  Breast Note: Both mastectomy incisions have healed nicely with no sign of infection or seroma. There is no palpable mass of either chest wall. There is no palpable axillary, supraclavicular, or cervical lymphadenopathy.   Cardiovascular Cardiovascular examination reveals -normal heart sounds, regular rate and rhythm with no murmurs and normal pedal pulses bilaterally.  Abdomen Inspection Inspection of the abdomen reveals - No Hernias. Skin - Scar - no surgical scars. Palpation/Percussion Palpation and Percussion of the abdomen reveal - Soft, Non Tender, No Rebound tenderness, No  Rigidity (guarding) and No hepatosplenomegaly. Auscultation Auscultation of the abdomen reveals - Bowel sounds normal.  Neurologic Neurologic evaluation reveals -alert and oriented x 3 with no impairment of recent or remote memory. Mental Status-Normal.  Musculoskeletal Normal  Exam - Left-Upper Extremity Strength Normal and Lower Extremity Strength Normal. Normal Exam - Right-Upper Extremity Strength Normal and Lower Extremity Strength Normal.  Lymphatic Head & Neck  General Head & Neck Lymphatics: Bilateral - Description - Normal. Axillary  General Axillary Region: Bilateral - Description - Normal. Tenderness - Non Tender. Femoral & Inguinal  Generalized Femoral & Inguinal Lymphatics: Bilateral - Description - Normal. Tenderness - Non Tender.    Assessment & Plan  MALIGNANT NEOPLASM OF UPPER-OUTER QUADRANT OF RIGHT BREAST IN FEMALE, ESTROGEN RECEPTOR POSITIVE (C50.411) Impression: The patient is about one year status post right modified radical mastectomy for breast cancer. She is about 29 years status post left modified radical mastectomy for breast cancer as well. She continues to do well with no clinical evidence of recurrence. At this point she will continue to take anastrozole. She will continue to do regular self exams. I will plan to see her back in about 6 months. When she is done with chemotherapy she would like to have her port removed. I have discussed with her in detail the risks and benefits of the operation as well as some of the technical aspects and she understands. Her oncologist will contact us when the time is right. This patient encounter took 20 minutes today to perform the following: take history, perform exam, review outside records, interpret imaging, counsel the patient on their diagnosis and document encounter, findings & plan in the EHR Current Plans Follow up with [place] in the office in 6 MONTHS.   Call us sooner as needed.

## 2020-09-16 NOTE — Discharge Instructions (Signed)
  Post Anesthesia Home Care Instructions  Activity: Get plenty of rest for the remainder of the day. A responsible individual must stay with you for 24 hours following the procedure.  For the next 24 hours, DO NOT: -Drive a car -Paediatric nurse -Drink alcoholic beverages -Take any medication unless instructed by your physician -Make any legal decisions or sign important papers.  Meals: Start with liquid foods such as gelatin or soup. Progress to regular foods as tolerated. Avoid greasy, spicy, heavy foods. If nausea and/or vomiting occur, drink only clear liquids until the nausea and/or vomiting subsides. Call your physician if vomiting continues.  Special Instructions/Symptoms: Your throat may feel dry or sore from the anesthesia or the breathing tube placed in your throat during surgery. If this causes discomfort, gargle with warm salt water. The discomfort should disappear within 24 hours.  If you had a scopolamine patch placed behind your ear for the management of post- operative nausea and/or vomiting:  1. The medication in the patch is effective for 72 hours, after which it should be removed.  Wrap patch in a tissue and discard in the trash. Wash hands thoroughly with soap and water. 2. You may remove the patch earlier than 72 hours if you experience unpleasant side effects which may include dry mouth, dizziness or visual disturbances. 3. Avoid touching the patch. Wash your hands with soap and water after contact with the patch.  No tylenol until after 1:30pm today if needed.  No ibuprofen until after 3:30pm today if needed.

## 2020-09-16 NOTE — Op Note (Signed)
09/16/2020  9:22 AM  PATIENT:  Marisa Spears  69 y.o. female  PRE-OPERATIVE DIAGNOSIS:  RIGHT BREAST CANCER  POST-OPERATIVE DIAGNOSIS:  RIGHT BREAST CANCER  PROCEDURE:  Procedure(s): REMOVAL PORT-A-CATH (Left)  SURGEON:  Surgeon(s) and Role:    * [name], MD - Primary  PHYSICIAN ASSISTANT:   ASSISTANTS: none   ANESTHESIA:   local and IV sedation  EBL:  minimal   BLOOD ADMINISTERED:none  DRAINS: none   LOCAL MEDICATIONS USED:  MARCAINE    and LIDOCAINE   SPECIMEN:  No Specimen  DISPOSITION OF SPECIMEN:  N/A  COUNTS:  YES  TOURNIQUET:  * No tourniquets in log *  DICTATION: .Dragon Dictation   After informed consent was obtained the patient was brought to the operating room and placed in the supine position on the operating table.  After IV sedation had been given the patient's left chest was prepped with ChloraPrep, allowed to dry, and draped in usual sterile manner.  An appropriate timeout was performed.  The area around the port was infiltrated with a combination of 1% lidocaine and quarter percent Marcaine until a good field block was created.  A small incision was then made through her previous incision with a 15 blade knife.  The incision was carried through the subcutaneous tissue sharply with a 15 blade knife until the capsule surrounding the port was opened.  The port was then gently pushed out of its pocket and with gentle traction was removed from the patient.  Pressure was held for several minutes until the area was completely hemostatic.  The tubing tract was closed with a figure-of-eight 3-0 Vicryl stitch.  The subcutaneous tissue was then closed with interrupted 3-0 Vicryl stitches.  The skin was closed with a running 4-0 Monocryl subcuticular stitch.  Dermabond dressings were applied.  The patient tolerated the procedure well.  At the end of the case all needle sponge and instrument counts were correct.  The patient was then awakened and taken to recovery in  stable condition.  PLAN OF CARE: Discharge to home after PACU  PATIENT DISPOSITION:  PACU - hemodynamically stable.   Delay start of Pharmacological VTE agent (>24hrs) due to surgical blood loss or risk of bleeding: not applicable

## 2020-09-16 NOTE — Interval H&P Note (Signed)
History and Physical Interval Note:  09/16/2020 8:09 AM  Marisa Spears  has presented today for surgery, with the diagnosis of RIGHT BREAST CANCER.  The various methods of treatment have been discussed with the patient and family. After consideration of risks, benefits and other options for treatment, the patient has consented to  Procedure(s): REMOVAL PORT-A-CATH (N/A) as a surgical intervention.  The patient's history has been reviewed, patient examined, no change in status, stable for surgery.  I have reviewed the patient's chart and labs.  Questions were answered to the patient's satisfaction.     Autumn Messing III

## 2020-09-16 NOTE — Transfer of Care (Signed)
Immediate Anesthesia Transfer of Care Note  Patient: Marisa Spears  Procedure(s) Performed: REMOVAL PORT-A-CATH (Left Chest)  Patient Location: PACU  Anesthesia Type:MAC  Level of Consciousness: awake, alert  and oriented  Airway & Oxygen Therapy: Patient Spontanous Breathing  Post-op Assessment: Report given to RN and Post -op Vital signs reviewed and stable  Post vital signs: Reviewed and stable  Last Vitals:  Vitals Value Taken Time  BP 123/73 09/16/20 0927  Temp    Pulse 56 09/16/20 0929  Resp 13 09/16/20 0929  SpO2 96 % 09/16/20 0929  Vitals shown include unvalidated device data.  Last Pain:  Vitals:   09/16/20 0713  TempSrc: Oral  PainSc: 0-No pain      Patients Stated Pain Goal: 0 (81/02/54 8628)  Complications: No complications documented.

## 2020-09-16 NOTE — Anesthesia Preprocedure Evaluation (Signed)
Anesthesia Evaluation  Patient identified by MRN, date of birth, ID band Patient awake    Reviewed: Allergy & Precautions, H&P , NPO status , Patient's Chart, lab work & pertinent test results  History of Anesthesia Complications (+) PONV and history of anesthetic complications  Airway Mallampati: II   Neck ROM: full    Dental   Pulmonary former smoker,    breath sounds clear to auscultation       Cardiovascular hypertension,  Rhythm:regular Rate:Normal     Neuro/Psych    GI/Hepatic   Endo/Other    Renal/GU      Musculoskeletal  (+) Arthritis ,   Abdominal   Peds  Hematology   Anesthesia Other Findings   Reproductive/Obstetrics Breast CA                             Anesthesia Physical Anesthesia Plan  ASA: II  Anesthesia Plan: MAC   Post-op Pain Management:    Induction: Intravenous  PONV Risk Score and Plan: 3 and Propofol infusion and Treatment may vary due to age or medical condition  Airway Management Planned: Simple Face Mask  Additional Equipment:   Intra-op Plan:   Post-operative Plan:   Informed Consent: I have reviewed the patients History and Physical, chart, labs and discussed the procedure including the risks, benefits and alternatives for the proposed anesthesia with the patient or authorized representative who has indicated his/her understanding and acceptance.       Plan Discussed with: CRNA, Anesthesiologist and Surgeon  Anesthesia Plan Comments:         Anesthesia Quick Evaluation

## 2020-09-19 ENCOUNTER — Encounter (HOSPITAL_BASED_OUTPATIENT_CLINIC_OR_DEPARTMENT_OTHER): Payer: Self-pay | Admitting: General Surgery

## 2020-09-22 NOTE — Anesthesia Postprocedure Evaluation (Addendum)
Anesthesia Post Note  Patient: Marisa Spears  Procedure(s) Performed: REMOVAL PORT-A-CATH (Left Chest)     Patient location during evaluation: PACU Anesthesia Type: MAC Level of consciousness: awake and alert Pain management: pain level controlled Vital Signs Assessment: post-procedure vital signs reviewed and stable Respiratory status: spontaneous breathing, nonlabored ventilation, respiratory function stable and patient connected to nasal cannula oxygen Cardiovascular status: stable and blood pressure returned to baseline Postop Assessment: no apparent nausea or vomiting Anesthetic complications: no   No complications documented.  Last Vitals:  Vitals:   09/16/20 0945 09/16/20 1000  BP: 134/72 (!) 145/73  Pulse: (!) 56 (!) 56  Resp: 12 16  Temp:  (!) 36.2 C  SpO2: 98% 98%    Last Pain:  Vitals:   09/16/20 1000  TempSrc:   PainSc: 0-No pain                 [name]

## 2020-09-26 NOTE — Progress Notes (Signed)
Patient Care Team: Earney Mallet, MD as PCP - General (Family Medicine) [name], RN as Oncology Nurse Navigator Rockwell Germany, RN as Oncology Nurse Navigator [name], MD as Consulting Physician (Hematology and Oncology) [name], MD as Consulting Physician (General Surgery) [name], MD as Attending Physician (Radiation Oncology)  DIAGNOSIS:    ICD-10-CM   1. Malignant neoplasm of upper-outer quadrant of right breast in female, estrogen receptor positive ([place])  C50.411    Z17.0     SUMMARY OF ONCOLOGIC HISTORY: Oncology History  Malignant neoplasm of upper-outer quadrant of right breast in female, estrogen receptor positive ([place])  1992 Miscellaneous   Left mastectomy followed by adjuvant chemotherapy   05/22/2019 Initial Diagnosis   Patient palpated a lump in the right breast. Mammogram showed 2.2cm mass in the UOQ, with no right axillary adenopathy. 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 The Common Hereditary Cancers Panel offered by [facility] includes sequencing and/or deletion duplication testing of the following 48 genes: APC, ATM, AXIN2, BARD1, BMPR1A, BRCA1, BRCA2, BRIP1, CDH1, CDKN2A (p14ARF), CDKN2A (p16INK4a), CKD4, CHEK2, CTNNA1, DICER1, EPCAM (Deletion/duplication testing only), GREM1 (promoter region deletion/duplication testing only), KIT, MEN1, MLH1, MSH2, MSH3, MSH6, MUTYH, NBN, NF1, NHTL1, PALB2, PDGFRA, PMS2, POLD1, POLE, PTEN, RAD50, RAD51C, RAD51D, RNF43, SDHB, SDHC, SDHD, SMAD4, SMARCA4. STK11, TP53, TSC1,  TSC2, and VHL.  The following genes were evaluated for sequence changes only: SDHA and HOXB13 c.251G>A variant only. The report date is 06/18/2019.   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   07/17/2019 Cancer Staging   Staging form: Breast, AJCC 8th Edition - Pathologic: Stage IIA (pT2, pN0, cM0, G3, ER+, PR-, HER2+) - Signed by [name], MD on 07/17/2019   08/10/2019 - 11/25/2019 Chemotherapy   The patient had dexamethasone (DECADRON) 4 MG tablet, 4 mg (100 % of original dose 4 mg), Oral, Daily, 1 of 1 cycle, Start date: 07/17/2019, End date: 07/19/2019 Dose modification: 4 mg (original dose 4 mg, Cycle 0) palonosetron (ALOXI) injection 0.25 mg, 0.25 mg, Intravenous,  Once, 6 of 6 cycles Administration: 0.25 mg (08/10/2019), 0.25 mg (08/31/2019), 0.25 mg (11/02/2019), 0.25 mg (11/23/2019), 0.25 mg (09/21/2019), 0.25 mg (10/12/2019) pegfilgrastim-jmdb (FULPHILA) injection 6 mg, 6 mg, Subcutaneous,  Once, 6 of 6 cycles Administration: 6 mg (08/12/2019), 6 mg (09/02/2019), 6 mg (11/04/2019), 6 mg (11/25/2019), 6 mg (09/23/2019), 6 mg (10/14/2019) CARBOplatin (PARAPLATIN) 600 mg in sodium chloride 0.9 % 250 mL chemo infusion, 600 mg (99.3 % of original dose 604.2 mg), Intravenous,  Once, 6 of 6 cycles Dose modification:   (original dose 604.2 mg, Cycle 1), 600 mg (original dose 604.2 mg, Cycle 1),   (original dose 604.2 mg, Cycle 2, Reason: Provider Judgment) Administration: 600 mg (08/10/2019), 500 mg (08/31/2019), 500 mg (11/02/2019), 500 mg (11/23/2019), 500 mg (09/21/2019), 500 mg (10/12/2019) DOCEtaxel (TAXOTERE) 140 mg in sodium chloride 0.9 % 250 mL chemo infusion, 65 mg/m2 = 140 mg (86.7 % of original dose 75 mg/m2), Intravenous,  Once, 6  of 6 cycles Dose modification: 65 mg/m2 (original dose 75 mg/m2, Cycle 1, Reason: Provider Judgment), 50 mg/m2 (original dose 75 mg/m2, Cycle 2,  Reason: Provider Judgment), 60 mg/m2 (original dose 75 mg/m2, Cycle 2, Reason: Dose not tolerated) Administration: 140 mg (08/10/2019), 130 mg (08/31/2019), 130 mg (11/02/2019), 130 mg (11/23/2019), 130 mg (09/21/2019), 130 mg (10/12/2019) pertuzumab (PERJETA) 420 mg in sodium chloride 0.9 % 250 mL chemo infusion, 420 mg (50 % of original dose 840 mg), Intravenous, Once, 6 of 6 cycles Dose modification: 420 mg (original dose 840 mg, Cycle 1, Reason: Provider Judgment) Administration: 420 mg (08/10/2019), 420 mg (08/31/2019), 420 mg (11/02/2019), 420 mg (11/23/2019), 420 mg (09/21/2019), 420 mg (10/12/2019) fosaprepitant (EMEND) 150 mg, dexamethasone (DECADRON) 12 mg in sodium chloride 0.9 % 145 mL IVPB, , Intravenous,  Once, 6 of 6 cycles Administration:  (08/10/2019),  (08/31/2019),  (11/02/2019),  (11/23/2019),  (09/21/2019),  (10/12/2019) trastuzumab-dkst (OGIVRI) 750 mg in sodium chloride 0.9 % 250 mL chemo infusion, 714 mg, Intravenous,  Once, 6 of 6 cycles Administration: 750 mg (08/10/2019), 600 mg (08/31/2019), 546 mg (11/02/2019), 546 mg (11/23/2019), 600 mg (09/21/2019), 546 mg (10/12/2019)  for chemotherapy treatment.    12/14/2019 -  Chemotherapy   The patient had pertuzumab (PERJETA) 420 mg in sodium chloride 0.9 % 250 mL chemo infusion, 420 mg, Intravenous, Once, 12 of 12 cycles Administration: 420 mg (12/14/2019), 420 mg (03/07/2020), 420 mg (03/28/2020), 420 mg (05/31/2020), 420 mg (06/20/2020), 420 mg (01/04/2020), 420 mg (01/25/2020), 420 mg (02/15/2020), 420 mg (04/18/2020), 420 mg (05/09/2020), 420 mg (07/12/2020), 420 mg (08/02/2020) trastuzumab-dkst (OGIVRI) 546 mg in sodium chloride 0.9 % 250 mL chemo infusion, 6 mg/kg = 546 mg, Intravenous,  Once, 12 of 12 cycles Administration: 546 mg (12/14/2019), 546 mg (03/07/2020), 546 mg (03/28/2020), 546 mg (05/31/2020), 546 mg (06/20/2020), 546 mg (01/04/2020), 546 mg (01/25/2020), 546 mg (02/15/2020), 546 mg (04/18/2020), 546 mg (05/09/2020), 546 mg (07/12/2020), 546 mg  (08/02/2020)  for chemotherapy treatment.      CHIEF COMPLIANT: Follow-up of right breast cancer to discuss further treatment  INTERVAL HISTORY: Marisa Spears is a 69 y.o. with above-mentioned history of HER-2 positive right breast cancerwho underwent a mastectomy,adjuvantchemotherapy,  Herceptin Perjeta maintenance, andis currently on antiestrogen therapy with anastrozole.She presents to the clinic todayto discuss the side effects to anastrozole therapy.  She is experience profound itching on both her arms between the elbow and the shoulder.  This was extremely bad until we decided to stop anastrozole.  Since then her itching has significantly improved.  She is able to sleep very well.  She takes allergy medication.  She is here to talk about what other antiestrogen therapy plans are available.  ALLERGIES:  is allergic to codeine.  MEDICATIONS:  Current Outpatient Medications  Medication Sig Dispense Refill   aspirin EC 81 MG tablet Take 81 mg by mouth at bedtime.      Biotin w/ Vitamins C & E (HAIR/SKIN/NAILS PO) Take 2 tablets by mouth daily.     Calcium Carb-Cholecalciferol (CALCIUM 600+D3 PO) Take 1 tablet by mouth daily.     Cholecalciferol (EQL VITAMIN D3) 50 MCG (2000 UT) CAPS Take 4,000 Units by mouth daily.     gabapentin (NEURONTIN) 300 MG capsule Take 1 capsule (300 mg total) by mouth 3 (three) times daily. 90 capsule 3   Omega-3 Fatty Acids (FISH OIL ULTRA) 1400 MG CAPS Take 1,400 mg by mouth 2 (two) times daily.     pravastatin (PRAVACHOL) 40 MG tablet Take 40  mg by mouth at bedtime.      triamterene-hydrochlorothiazide (MAXZIDE-25) 37.5-25 MG tablet Take 1 tablet by mouth daily.      venlafaxine XR (EFFEXOR-XR) 37.5 MG 24 hr capsule TAKE 1 CAPSULE BY MOUTH ONCE DAILY WITH BREAKFAST 90 capsule 3   No current facility-administered medications for this visit.    PHYSICAL EXAMINATION: ECOG PERFORMANCE STATUS: 1 - Symptomatic but completely ambulatory  Vitals:    09/27/20 1204  BP: (!) 125/93  Pulse: 77  Resp: 18  Temp: 97.6 F (36.4 C)  SpO2: 97%   Filed Weights   09/27/20 1204  Weight: 211 lb (95.7 kg)    BREAST: No palpable masses or nodules in either right or left breasts. No palpable axillary supraclavicular or infraclavicular adenopathy no breast tenderness or nipple discharge. (exam performed in the presence of a chaperone)  LABORATORY DATA:  I have reviewed the data as listed CMP Latest Ref Rng & Units 09/13/2020 07/12/2020 05/31/2020  Glucose 70 - 99 mg/dL 127(H) 124(H) 100(H)  BUN 8 - 23 mg/dL '15 12 9  ' Creatinine 0.44 - 1.00 mg/dL 0.71 0.79 0.79  Sodium 135 - 145 mmol/L 139 139 140  Potassium 3.5 - 5.1 mmol/L 4.5 4.0 3.8  Chloride 98 - 111 mmol/L 102 103 105  CO2 22 - 32 mmol/L '25 27 26  ' Calcium 8.9 - 10.3 mg/dL 9.8 10.0 9.9  Total Protein 6.5 - 8.1 g/dL - 7.3 7.3  Total Bilirubin 0.3 - 1.2 mg/dL - 0.7 0.7  Alkaline Phos 38 - 126 U/L - 70 68  AST 15 - 41 U/L - 18 23  ALT 0 - 44 U/L - 14 16    Lab Results  Component Value Date   WBC 6.8 07/12/2020   HGB 12.9 07/12/2020   HCT 38.1 07/12/2020   MCV 90.1 07/12/2020   PLT 203 07/12/2020   NEUTROABS 4.6 07/12/2020    ASSESSMENT & PLAN:  Malignant neoplasm of upper-outer quadrant of right breast in female, estrogen receptor positive ([place]) 05/22/2019:Patient palpated a lump in the right breast. Mammogram showed 2.2cm mass in the UOQ, with no right axillary adenopathy. 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 Previous anemia: Resolved  Anastrozole toxicities: Profound itching between the elbow and the shoulder. Because of this we discontinued anastrozole Or plans to switch her to an alternate antiestrogen therapy with letrozole or tamoxifen or exemestane.  She will check with her insurance which of these is being covered.  Return to clinic in 1 month for MyChart virtual visit.  At that time we will make a decision in which antiestrogen therapy she will go on.    No orders of the defined types were placed in this encounter.  The patient has a good understanding of the overall plan. she agrees with it. she will call with any  problems that may develop before the next visit here.  Total time spent: 20 mins including face to face time and time spent for planning, charting and coordination of care  [name], MD 09/27/2020  I, Cloyde Reams Dorshimer, am acting as scribe for Dr. [name].  I have reviewed the above documentation for accuracy and completeness, and I agree with the above.

## 2020-09-27 ENCOUNTER — Other Ambulatory Visit: Payer: Self-pay

## 2020-09-27 ENCOUNTER — Inpatient Hospital Stay: Payer: Medicare Other | Attending: Hematology and Oncology | Admitting: Hematology and Oncology

## 2020-09-27 DIAGNOSIS — Z79811 Long term (current) use of aromatase inhibitors: Secondary | ICD-10-CM | POA: Insufficient documentation

## 2020-09-27 DIAGNOSIS — G620 Drug-induced polyneuropathy: Secondary | ICD-10-CM | POA: Insufficient documentation

## 2020-09-27 DIAGNOSIS — Z79899 Other long term (current) drug therapy: Secondary | ICD-10-CM | POA: Diagnosis not present

## 2020-09-27 DIAGNOSIS — C50411 Malignant neoplasm of upper-outer quadrant of right female breast: Secondary | ICD-10-CM

## 2020-09-27 DIAGNOSIS — Z170 Estrogen receptor positive status [ER+]: Secondary | ICD-10-CM

## 2020-09-27 NOTE — Assessment & Plan Note (Signed)
05/22/2019:Patient palpated a lump in the right breast. Mammogram showed 2.2cm mass in the UOQ, with no right axillary adenopathy. 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 Previous anemia: Resolved  Anastrozole toxicities: Muscle cramps  Return to clinic in 6 months for follow-up.  After that we can see her once a year.

## 2020-10-25 NOTE — Assessment & Plan Note (Signed)
05/22/2019:Patient palpated a lump in the right breast. Mammogram showed 2.2cm mass in the UOQ, with no right axillary adenopathy. 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 Previous anemia: Resolved  Anastrozole toxicities: Profound itching between the elbow and the shoulder. Because of this we discontinued anastrozole Or plans to switch her to an alternate antiestrogen therapy with letrozole or tamoxifen or exemestane.  She will check with her insurance which of these is being covered.  Return to clinic in 1 month for MyChart virtual visit.  At that time we will make a decision in which antiestrogen therapy she will go on.

## 2020-10-25 NOTE — Progress Notes (Signed)
A Marisa Spears 1 mute I cannot hear anything your microphone is off HEMATOLOGY-ONCOLOGY [place] VISIT PROGRESS NOTE  I connected with Marisa Spears on 10/26/2020 at  2:30 PM EST by MyChart video conference and verified that I am speaking with the correct person using two identifiers.  I discussed the limitations, risks, security and privacy concerns of performing an evaluation and management service by MyChart and the availability of in person appointments.  I also discussed with the patient that there may be a patient responsible charge related to this service. The patient expressed understanding and agreed to proceed.  Patient's Location: Home Physician Location: Clinic  CHIEF COMPLIANT: Follow-up of right breast cancer after discontinuing anastrozole for itching  INTERVAL HISTORY: Marisa Spears is a 69 y.o. female with above-mentioned history of HER-2 positive right breast cancerwho underwent a mastectomy,adjuvantchemotherapy,  Herceptin Perjeta maintenance, andis currently on antiestrogen therapy with anastrozole.She presents over MyChart todayfor follow-up.  She had profound itching to anastrozole and when we stopped it the itching went away.  Oncology History  Malignant neoplasm of upper-outer quadrant of right breast in female, estrogen receptor positive ([place])  1992 Miscellaneous   Left mastectomy followed by adjuvant chemotherapy   05/22/2019 Initial Diagnosis   Patient palpated a lump in the right breast. Mammogram showed 2.2cm mass in the UOQ, with no right axillary adenopathy. 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 The Common Hereditary Cancers Panel offered by [facility] includes sequencing and/or deletion duplication testing of the following 48 genes: APC, ATM, AXIN2, BARD1, BMPR1A, BRCA1, BRCA2, BRIP1, CDH1, CDKN2A (p14ARF), CDKN2A (p16INK4a), CKD4, CHEK2, CTNNA1, DICER1, EPCAM (Deletion/duplication testing only), GREM1 (promoter region deletion/duplication testing only), KIT, MEN1, MLH1, MSH2, MSH3, MSH6, MUTYH, NBN, NF1, NHTL1, PALB2, PDGFRA, PMS2, POLD1, POLE, PTEN, RAD50, RAD51C, RAD51D, RNF43, SDHB, SDHC, SDHD, SMAD4, SMARCA4. STK11, TP53, TSC1, TSC2, and VHL.  The following genes were evaluated for sequence changes only: SDHA and HOXB13 c.251G>A variant only. The report date is 06/18/2019.   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   07/17/2019 Cancer Staging   Staging form: Breast, AJCC 8th Edition - Pathologic: Stage IIA (pT2, pN0, cM0, G3, ER+, PR-, HER2+) - Signed by [name], MD on 07/17/2019   08/10/2019 - 11/25/2019 Chemotherapy   The patient had dexamethasone (DECADRON) 4 MG tablet, 4 mg (100 % of original dose 4 mg), Oral, Daily, 1 of 1 cycle, Start date: 07/17/2019, End date: 07/19/2019 Dose modification: 4 mg (original dose 4 mg, Cycle 0) palonosetron (ALOXI) injection 0.25 mg, 0.25 mg, Intravenous,  Once, 6 of 6 cycles Administration: 0.25 mg (08/10/2019), 0.25 mg (08/31/2019), 0.25 mg (11/02/2019), 0.25 mg (11/23/2019), 0.25 mg (09/21/2019), 0.25 mg (10/12/2019) pegfilgrastim-jmdb (FULPHILA) injection 6 mg, 6 mg, Subcutaneous,  Once, 6 of 6 cycles Administration: 6 mg (08/12/2019), 6 mg (09/02/2019), 6 mg (11/04/2019), 6 mg (11/25/2019), 6 mg (09/23/2019), 6 mg (10/14/2019) CARBOplatin (PARAPLATIN) 600 mg in sodium chloride 0.9 % 250 mL chemo infusion, 600 mg (99.3 % of original dose 604.2  mg),  Intravenous,  Once, 6 of 6 cycles Dose modification:   (original dose 604.2 mg, Cycle 1), 600 mg (original dose 604.2 mg, Cycle 1),   (original dose 604.2 mg, Cycle 2, Reason: Provider Judgment) Administration: 600 mg (08/10/2019), 500 mg (08/31/2019), 500 mg (11/02/2019), 500 mg (11/23/2019), 500 mg (09/21/2019), 500 mg (10/12/2019) DOCEtaxel (TAXOTERE) 140 mg in sodium chloride 0.9 % 250 mL chemo infusion, 65 mg/m2 = 140 mg (86.7 % of original dose 75 mg/m2), Intravenous,  Once, 6 of 6 cycles Dose modification: 65 mg/m2 (original dose 75 mg/m2, Cycle 1, Reason: Provider Judgment), 50 mg/m2 (original dose 75 mg/m2, Cycle 2, Reason: Provider Judgment), 60 mg/m2 (original dose 75 mg/m2, Cycle 2, Reason: Dose not tolerated) Administration: 140 mg (08/10/2019), 130 mg (08/31/2019), 130 mg (11/02/2019), 130 mg (11/23/2019), 130 mg (09/21/2019), 130 mg (10/12/2019) pertuzumab (PERJETA) 420 mg in sodium chloride 0.9 % 250 mL chemo infusion, 420 mg (50 % of original dose 840 mg), Intravenous, Once, 6 of 6 cycles Dose modification: 420 mg (original dose 840 mg, Cycle 1, Reason: Provider Judgment) Administration: 420 mg (08/10/2019), 420 mg (08/31/2019), 420 mg (11/02/2019), 420 mg (11/23/2019), 420 mg (09/21/2019), 420 mg (10/12/2019) fosaprepitant (EMEND) 150 mg, dexamethasone (DECADRON) 12 mg in sodium chloride 0.9 % 145 mL IVPB, , Intravenous,  Once, 6 of 6 cycles Administration:  (08/10/2019),  (08/31/2019),  (11/02/2019),  (11/23/2019),  (09/21/2019),  (10/12/2019) trastuzumab-dkst (OGIVRI) 750 mg in sodium chloride 0.9 % 250 mL chemo infusion, 714 mg, Intravenous,  Once, 6 of 6 cycles Administration: 750 mg (08/10/2019), 600 mg (08/31/2019), 546 mg (11/02/2019), 546 mg (11/23/2019), 600 mg (09/21/2019), 546 mg (10/12/2019)  for chemotherapy treatment.    12/14/2019 -  Chemotherapy   The patient had pertuzumab (PERJETA) 420 mg in sodium chloride 0.9 % 250 mL chemo infusion, 420 mg, Intravenous, Once, 12 of 12  cycles Administration: 420 mg (12/14/2019), 420 mg (03/07/2020), 420 mg (03/28/2020), 420 mg (05/31/2020), 420 mg (06/20/2020), 420 mg (01/04/2020), 420 mg (01/25/2020), 420 mg (02/15/2020), 420 mg (04/18/2020), 420 mg (05/09/2020), 420 mg (07/12/2020), 420 mg (08/02/2020) trastuzumab-dkst (OGIVRI) 546 mg in sodium chloride 0.9 % 250 mL chemo infusion, 6 mg/kg = 546 mg, Intravenous,  Once, 12 of 12 cycles Administration: 546 mg (12/14/2019), 546 mg (03/07/2020), 546 mg (03/28/2020), 546 mg (05/31/2020), 546 mg (06/20/2020), 546 mg (01/04/2020), 546 mg (01/25/2020), 546 mg (02/15/2020), 546 mg (04/18/2020), 546 mg (05/09/2020), 546 mg (07/12/2020), 546 mg (08/02/2020)  for chemotherapy treatment.     Observations/Objective:  There were no vitals filed for this visit. There is no height or weight on file to calculate BMI.  I have reviewed the data as listed CMP Latest Ref Rng & Units 09/13/2020 07/12/2020 05/31/2020  Glucose 70 - 99 mg/dL 127(H) 124(H) 100(H)  BUN 8 - 23 mg/dL '15 12 9  ' Creatinine 0.44 - 1.00 mg/dL 0.71 0.79 0.79  Sodium 135 - 145 mmol/L 139 139 140  Potassium 3.5 - 5.1 mmol/L 4.5 4.0 3.8  Chloride 98 - 111 mmol/L 102 103 105  CO2 22 - 32 mmol/L '25 27 26  ' Calcium 8.9 - 10.3 mg/dL 9.8 10.0 9.9  Total Protein 6.5 - 8.1 g/dL - 7.3 7.3  Total Bilirubin 0.3 - 1.2 mg/dL - 0.7 0.7  Alkaline Phos 38 - 126 U/L - 70 68  AST 15 - 41 U/L - 18 23  ALT 0 - 44 U/L - 14 16    Lab Results  Component Value Date   WBC 6.8 07/12/2020  HGB 12.9 07/12/2020   HCT 38.1 07/12/2020   MCV 90.1 07/12/2020   PLT 203 07/12/2020   NEUTROABS 4.6 07/12/2020      Assessment Plan:  Malignant neoplasm of upper-outer quadrant of right breast in female, estrogen receptor positive ([place]) 05/22/2019:Patient palpated a lump in the right breast. Mammogram showed 2.2cm mass in the UOQ, with no right axillary adenopathy. 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    Anastrozole toxicities: Profound itching between the elbow and the shoulder improved with stopping anastrozole We will switch to Letrozole I will connect with her in a month to see if she is tolerating letrozole well. She informed me that she may have to switch her pharmacy to [facility].  If she does that we will send the prescription to [facility].   I discussed the assessment and treatment plan with the patient. The patient was provided an opportunity to ask questions and all were answered. The patient agreed with the plan and demonstrated an understanding of the instructions. The patient was advised to call back or seek an in-person evaluation if the symptoms worsen or if the condition fails to improve as anticipated.   I provided 20 minutes of face-to-face MyChart video visit time during this encounter.    Rulon Eisenmenger, MD 10/26/2020   I, Molly Dorshimer, am acting as scribe for [name], MD.  I have reviewed the above documentation for accuracy and completeness, and I agree with the  above.

## 2020-10-26 ENCOUNTER — Inpatient Hospital Stay: Payer: Medicare Other | Attending: Hematology and Oncology | Admitting: Hematology and Oncology

## 2020-10-26 DIAGNOSIS — Z170 Estrogen receptor positive status [ER+]: Secondary | ICD-10-CM | POA: Diagnosis not present

## 2020-10-26 DIAGNOSIS — Z79811 Long term (current) use of aromatase inhibitors: Secondary | ICD-10-CM | POA: Diagnosis not present

## 2020-10-26 DIAGNOSIS — G620 Drug-induced polyneuropathy: Secondary | ICD-10-CM | POA: Diagnosis not present

## 2020-10-26 DIAGNOSIS — F419 Anxiety disorder, unspecified: Secondary | ICD-10-CM | POA: Diagnosis not present

## 2020-10-26 DIAGNOSIS — C50411 Malignant neoplasm of upper-outer quadrant of right female breast: Secondary | ICD-10-CM | POA: Diagnosis present

## 2020-10-26 DIAGNOSIS — F32A Depression, unspecified: Secondary | ICD-10-CM | POA: Insufficient documentation

## 2020-10-26 MED ORDER — LETROZOLE 2.5 MG PO TABS: 2.5000 mg | ORAL_TABLET | Freq: Every day | ORAL | 3 refills | Status: DC

## 2020-11-21 ENCOUNTER — Telehealth: Payer: Self-pay | Admitting: *Deleted

## 2020-11-21 NOTE — Telephone Encounter (Signed)
Received VM from pt.  Attempt x1 to return call.  No answer, LVM to return call to the office.  

## 2020-11-22 ENCOUNTER — Other Ambulatory Visit: Payer: Self-pay | Admitting: *Deleted

## 2020-11-22 MED ORDER — GABAPENTIN 300 MG PO CAPS
300.0000 mg | ORAL_CAPSULE | Freq: Three times a day (TID) | ORAL | 0 refills | Status: DC
Start: 2020-11-22 — End: 2020-12-26

## 2020-11-22 NOTE — Telephone Encounter (Signed)
Received call from pt requesting to update our office on how she is tolerating letrozole.  Pt states no negative side effects and itching has subsided.  Pt educated to call the office if symptoms do resume, pt verbalized understanding.

## 2020-12-26 ENCOUNTER — Other Ambulatory Visit: Payer: Self-pay

## 2020-12-26 MED ORDER — GABAPENTIN 300 MG PO CAPS
300.0000 mg | ORAL_CAPSULE | Freq: Three times a day (TID) | ORAL | 2 refills | Status: DC
Start: 2020-12-26 — End: 2021-01-30

## 2021-01-29 NOTE — Progress Notes (Signed)
Patient Care Team: Alinda Deem, MD as PCP - General (Family Medicine) Pershing Proud, RN as Oncology Nurse Navigator Donnelly Angelica, RN as Oncology Nurse Navigator Serena Croissant, MD as Consulting Physician (Hematology and Oncology) Griselda Miner, MD as Consulting Physician (General Surgery) Lonie Peak, MD as Attending Physician (Radiation Oncology)  DIAGNOSIS:    ICD-10-CM   1. Malignant neoplasm of upper-outer quadrant of right breast in female, estrogen receptor positive ([facility])  C50.411    Z17.0     SUMMARY OF ONCOLOGIC HISTORY: Oncology History  Malignant neoplasm of upper-outer quadrant of right breast in female, estrogen receptor positive ([facility])  1992 Miscellaneous   Left mastectomy followed by adjuvant chemotherapy   05/22/2019 Initial Diagnosis   Patient palpated a lump in the right breast. Mammogram showed 2.2cm mass in the UOQ, with no right axillary adenopathy. 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 The Common Hereditary Cancers Panel offered by [facility] includes sequencing and/or deletion duplication testing of the following 48 genes: APC, ATM, AXIN2, BARD1, BMPR1A, BRCA1, BRCA2, BRIP1, CDH1, CDKN2A (p14ARF), CDKN2A (p16INK4a), CKD4, CHEK2, CTNNA1, DICER1, EPCAM (Deletion/duplication testing only), GREM1 (promoter region deletion/duplication testing only), KIT, MEN1, MLH1, MSH2, MSH3, MSH6, MUTYH, NBN, NF1, NHTL1, PALB2, PDGFRA, PMS2, POLD1, POLE, PTEN, RAD50, RAD51C, RAD51D, RNF43, SDHB, SDHC, SDHD, SMAD4, SMARCA4. STK11, TP53, TSC1,  TSC2, and VHL.  The following genes were evaluated for sequence changes only: SDHA and HOXB13 c.251G>A variant only. The report date is 06/18/2019.   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   07/17/2019 Cancer Staging   Staging form: Breast, AJCC 8th Edition - Pathologic: Stage IIA (pT2, pN0, cM0, G3, ER+, PR-, HER2+) - Signed by Serena Croissant, MD on 07/17/2019   08/10/2019 - 11/25/2019 Chemotherapy   The patient had dexamethasone (DECADRON) 4 MG tablet, 4 mg (100 % of original dose 4 mg), Oral, Daily, 1 of 1 cycle, Start date: 07/17/2019, End date: 07/19/2019 Dose modification: 4 mg (original dose 4 mg, Cycle 0) palonosetron (ALOXI) injection 0.25 mg, 0.25 mg, Intravenous,  Once, 6 of 6 cycles Administration: 0.25 mg (08/10/2019), 0.25 mg (08/31/2019), 0.25 mg (11/02/2019), 0.25 mg (11/23/2019), 0.25 mg (09/21/2019), 0.25 mg (10/12/2019) pegfilgrastim-jmdb (FULPHILA) injection 6 mg, 6 mg, Subcutaneous,  Once, 6 of 6 cycles Administration: 6 mg (08/12/2019), 6 mg (09/02/2019), 6 mg (11/04/2019), 6 mg (11/25/2019), 6 mg (09/23/2019), 6 mg (10/14/2019) CARBOplatin (PARAPLATIN) 600 mg in sodium chloride 0.9 % 250 mL chemo infusion, 600 mg (99.3 % of original dose 604.2 mg), Intravenous,  Once, 6 of 6 cycles Dose modification:   (original dose 604.2 mg, Cycle 1), 600 mg (original dose 604.2 mg, Cycle 1),   (original dose 604.2 mg, Cycle 2, Reason: Provider Judgment) Administration: 600 mg (08/10/2019), 500 mg (08/31/2019), 500 mg (11/02/2019), 500 mg (11/23/2019), 500 mg (09/21/2019), 500 mg (10/12/2019) DOCEtaxel (TAXOTERE) 140 mg in sodium chloride 0.9 % 250 mL chemo infusion, 65 mg/m2 = 140 mg (86.7 % of original dose 75 mg/m2), Intravenous,  Once, 6  of 6 cycles Dose modification: 65 mg/m2 (original dose 75 mg/m2, Cycle 1, Reason: Provider Judgment), 50 mg/m2 (original dose 75 mg/m2, Cycle 2,  Reason: Provider Judgment), 60 mg/m2 (original dose 75 mg/m2, Cycle 2, Reason: Dose not tolerated) Administration: 140 mg (08/10/2019), 130 mg (08/31/2019), 130 mg (11/02/2019), 130 mg (11/23/2019), 130 mg (09/21/2019), 130 mg (10/12/2019) pertuzumab (PERJETA) 420 mg in sodium chloride 0.9 % 250 mL chemo infusion, 420 mg (50 % of original dose 840 mg), Intravenous, Once, 6 of 6 cycles Dose modification: 420 mg (original dose 840 mg, Cycle 1, Reason: Provider Judgment) Administration: 420 mg (08/10/2019), 420 mg (08/31/2019), 420 mg (11/02/2019), 420 mg (11/23/2019), 420 mg (09/21/2019), 420 mg (10/12/2019) fosaprepitant (EMEND) 150 mg, dexamethasone (DECADRON) 12 mg in sodium chloride 0.9 % 145 mL IVPB, , Intravenous,  Once, 6 of 6 cycles Administration:  (08/10/2019),  (08/31/2019),  (11/02/2019),  (11/23/2019),  (09/21/2019),  (10/12/2019) trastuzumab-dkst (OGIVRI) 750 mg in sodium chloride 0.9 % 250 mL chemo infusion, 714 mg, Intravenous,  Once, 6 of 6 cycles Administration: 750 mg (08/10/2019), 600 mg (08/31/2019), 546 mg (11/02/2019), 546 mg (11/23/2019), 600 mg (09/21/2019), 546 mg (10/12/2019)  for chemotherapy treatment.    12/14/2019 -  Chemotherapy   The patient had pertuzumab (PERJETA) 420 mg in sodium chloride 0.9 % 250 mL chemo infusion, 420 mg, Intravenous, Once, 12 of 12 cycles Administration: 420 mg (12/14/2019), 420 mg (03/07/2020), 420 mg (03/28/2020), 420 mg (05/31/2020), 420 mg (06/20/2020), 420 mg (01/04/2020), 420 mg (01/25/2020), 420 mg (02/15/2020), 420 mg (04/18/2020), 420 mg (05/09/2020), 420 mg (07/12/2020), 420 mg (08/02/2020) trastuzumab-dkst (OGIVRI) 546 mg in sodium chloride 0.9 % 250 mL chemo infusion, 6 mg/kg = 546 mg, Intravenous,  Once, 12 of 12 cycles Administration: 546 mg (12/14/2019), 546 mg (03/07/2020), 546 mg (03/28/2020), 546 mg (05/31/2020), 546 mg (06/20/2020), 546 mg (01/04/2020), 546 mg (01/25/2020), 546 mg (02/15/2020), 546 mg (04/18/2020), 546 mg (05/09/2020), 546 mg (07/12/2020), 546 mg  (08/02/2020)  for chemotherapy treatment.      CHIEF COMPLIANT: Follow-up ofright breast cancer   INTERVAL HISTORY: Marisa Spears is a 70 y.o. with above-mentioned history of HER-2 positive right breast cancerwho underwent a mastectomy,adjuvantchemotherapy, Herceptin Perjeta maintenance,andis currently onantiestrogen therapy with letrozole.She presents to the clinic todayfor follow-up.  Her major complaint with letrozole is severe joint stiffness especially when she wakes up.  Gets better when she exercises.  She joined weight loss program through [employer] and has lost 4 pounds since last week.  ALLERGIES:  is allergic to codeine.  MEDICATIONS:  Current Outpatient Medications  Medication Sig Dispense Refill  . aspirin EC 81 MG tablet Take 81 mg by mouth at bedtime.     . Biotin w/ Vitamins C & E (HAIR/SKIN/NAILS PO) Take 2 tablets by mouth daily.    . Calcium Carb-Cholecalciferol (CALCIUM 600+D3 PO) Take 1 tablet by mouth daily.    . Cholecalciferol (EQL VITAMIN D3) 50 MCG (2000 UT) CAPS Take 4,000 Units by mouth daily.    Marland Kitchen gabapentin (NEURONTIN) 300 MG capsule Take 1 capsule (300 mg total) by mouth 3 (three) times daily. 90 capsule 2  . letrozole (FEMARA) 2.5 MG tablet Take 1 tablet (2.5 mg total) by mouth daily. 90 tablet 3  . Omega-3 Fatty Acids (FISH OIL ULTRA) 1400 MG CAPS Take 1,400 mg by mouth 2 (two) times daily.    . pravastatin (PRAVACHOL) 40 MG tablet Take 40 mg by mouth at bedtime.     . triamterene-hydrochlorothiazide (MAXZIDE-25) 37.5-25 MG tablet Take 1 tablet  by mouth daily.     Marland Kitchen venlafaxine XR (EFFEXOR-XR) 37.5 MG 24 hr capsule TAKE 1 CAPSULE BY MOUTH ONCE DAILY WITH BREAKFAST 90 capsule 3   No current facility-administered medications for this visit.    PHYSICAL EXAMINATION: ECOG PERFORMANCE STATUS: 1 - Symptomatic but completely ambulatory  There were no vitals filed for this visit. There were no vitals filed for this visit.  BREAST: No palpable  masses or nodules in either right or left breasts. No palpable axillary supraclavicular or infraclavicular adenopathy no breast tenderness or nipple discharge. (exam performed in the presence of a chaperone)  LABORATORY DATA:  I have reviewed the data as listed CMP Latest Ref Rng & Units 09/13/2020 07/12/2020 05/31/2020  Glucose 70 - 99 mg/dL 161(W) 960(A) 540(J)  BUN 8 - 23 mg/dL 15 12 9   Creatinine 0.44 - 1.00 mg/dL 8.11 9.14 7.82  Sodium 135 - 145 mmol/L 139 139 140  Potassium 3.5 - 5.1 mmol/L 4.5 4.0 3.8  Chloride 98 - 111 mmol/L 102 103 105  CO2 22 - 32 mmol/L 25 27 26   Calcium 8.9 - 10.3 mg/dL 9.8 95.6 9.9  Total Protein 6.5 - 8.1 g/dL - 7.3 7.3  Total Bilirubin 0.3 - 1.2 mg/dL - 0.7 0.7  Alkaline Phos 38 - 126 U/L - 70 68  AST 15 - 41 U/L - 18 23  ALT 0 - 44 U/L - 14 16    Lab Results  Component Value Date   WBC 6.8 07/12/2020   HGB 12.9 07/12/2020   HCT 38.1 07/12/2020   MCV 90.1 07/12/2020   PLT 203 07/12/2020   NEUTROABS 4.6 07/12/2020    ASSESSMENT & PLAN:  Malignant neoplasm of upper-outer quadrant of right breast in female, estrogen receptor positive ([facility]) 05/22/2019:Patient palpated a lump in the right breast. Mammogram showed 2.2cm mass in the UOQ, with no right axillary adenopathy. 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 Stage 2A 2.Adjuvant chemotherapy with [place] Perjetax6 started 9/14/2020completed 12/28/2020followed by Herceptin Perjeta completed 08/02/2020 3.Adjuvant antiestrogen therapy with anastrozole 1 mg daily x7 years S1714: Taxane neuropathy study: No adverse effects due to participation in the  study -------------------------------------------------------------------------------------------------------------------------- Treatment plan:anastrozole (to be started 12/14/2019), switched to letrozole 10/26/20 (Profound itching between the elbow and the shoulder improved with stopping anastrozole)   Anxiety and depression:On Effexor 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    Letrozole toxicities: 1.  Joint stiffness and achiness: I discussed with her that as she loses weight, the stiffness will improve.  She is currently on weight watchers started last week and she lost already 4 pounds. 2. bone density will be done by her primary care physician later this year.  Return to clinic in 6 months for follow-up.   No orders of the defined types were placed in this encounter.  The patient has a good understanding of the overall plan. she agrees with it. she will call with any problems that may develop before the next visit here.  Total time spent: 30 mins including face to face time and time spent for planning, charting and coordination of care  Sabas Sous, MD, MPH 01/30/2021  I, Molly Dorshimer, am acting  as scribe for Dr. Serena Croissant.  I have reviewed the above documentation for accuracy and completeness, and I agree with the above.

## 2021-01-29 NOTE — Assessment & Plan Note (Signed)
05/22/2019:Patient palpated a lump in the right breast. Mammogram showed 2.2cm mass in the UOQ, with no right axillary adenopathy. 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 Stage 2A 2.Adjuvant chemotherapy with [place] Perjetax6 started 9/14/2020completed 12/28/2020followed by Herceptin Perjeta completed 08/02/2020 3.Adjuvant antiestrogen therapy with anastrozole 1 mg daily x7 years S1714: Taxane neuropathy study: No adverse effects due to participation in the study -------------------------------------------------------------------------------------------------------------------------- Treatment plan:anastrozole (to be started 12/14/2019), switched to letrozole 10/26/20 (Profound itching between the elbow and the shoulder improved with stopping anastrozole)   Anxiety and depression:On Effexor 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    Letrozole toxicities:

## 2021-01-30 ENCOUNTER — Other Ambulatory Visit: Payer: Self-pay

## 2021-01-30 ENCOUNTER — Inpatient Hospital Stay: Payer: Medicare Other | Attending: Hematology and Oncology | Admitting: Hematology and Oncology

## 2021-01-30 DIAGNOSIS — Z170 Estrogen receptor positive status [ER+]: Secondary | ICD-10-CM | POA: Insufficient documentation

## 2021-01-30 DIAGNOSIS — C50411 Malignant neoplasm of upper-outer quadrant of right female breast: Secondary | ICD-10-CM | POA: Insufficient documentation

## 2021-01-30 DIAGNOSIS — Z9013 Acquired absence of bilateral breasts and nipples: Secondary | ICD-10-CM | POA: Insufficient documentation

## 2021-01-30 DIAGNOSIS — Z79811 Long term (current) use of aromatase inhibitors: Secondary | ICD-10-CM | POA: Insufficient documentation

## 2021-01-30 MED ORDER — GABAPENTIN 300 MG PO CAPS
300.0000 mg | ORAL_CAPSULE | Freq: Three times a day (TID) | ORAL | 0 refills | Status: DC
Start: 2021-01-30 — End: 2021-08-29

## 2021-02-02 ENCOUNTER — Encounter: Payer: Self-pay | Admitting: Licensed Clinical Social Worker

## 2021-02-02 NOTE — Progress Notes (Signed)
UPDATE: VUS in [place] called c.[id number](Intronic) has been reclassified to "Likely Benign." The report date is 02/01/2021.

## 2021-07-13 ENCOUNTER — Telehealth: Payer: Self-pay | Admitting: Hematology and Oncology

## 2021-07-13 NOTE — Telephone Encounter (Signed)
R/s appt per 8/18 sch msg. Pt aware.

## 2021-08-02 ENCOUNTER — Ambulatory Visit: Payer: Medicare Other | Admitting: Hematology and Oncology

## 2021-08-28 ENCOUNTER — Ambulatory Visit: Payer: Medicare Other | Admitting: Hematology and Oncology

## 2021-08-28 NOTE — Progress Notes (Signed)
Patient Care Team: Earney Mallet, MD as PCP - General (Family Medicine) [name], RN as Oncology Nurse Navigator Rockwell Germany, RN as Oncology Nurse Navigator [name], MD as Consulting Physician (Hematology and Oncology) [name], MD as Consulting Physician (General Surgery) [name], MD as Attending Physician (Radiation Oncology)  DIAGNOSIS:    ICD-10-CM   1. Malignant neoplasm of upper-outer quadrant of right breast in female, estrogen receptor positive ([place])  C50.411    Z17.0       SUMMARY OF ONCOLOGIC HISTORY: Oncology History  Malignant neoplasm of upper-outer quadrant of right breast in female, estrogen receptor positive ([place])  1992 Miscellaneous   Left mastectomy followed by adjuvant chemotherapy   05/22/2019 Initial Diagnosis   Patient palpated a lump in the right breast. Mammogram showed 2.2cm mass in the UOQ, with no right axillary adenopathy. 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 The Common Hereditary Cancers Panel offered by [facility] includes sequencing and/or deletion duplication testing of the following 48 genes: APC, ATM, AXIN2, BARD1, BMPR1A, BRCA1, BRCA2, BRIP1, CDH1, CDKN2A (p14ARF), CDKN2A (p16INK4a), CKD4, CHEK2, CTNNA1, DICER1, EPCAM (Deletion/duplication testing only), GREM1 (promoter region deletion/duplication testing only), KIT, MEN1, MLH1, MSH2, MSH3, MSH6, MUTYH, NBN, NF1, NHTL1, PALB2, PDGFRA, PMS2, POLD1, POLE, PTEN, RAD50, RAD51C, RAD51D, RNF43, SDHB, SDHC, SDHD, SMAD4, SMARCA4. STK11, TP53, TSC1,  TSC2, and VHL.  The following genes were evaluated for sequence changes only: SDHA and HOXB13 c.251G>A variant only. The report date is 06/18/2019.   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   07/17/2019 Cancer Staging   Staging form: Breast, AJCC 8th Edition - Pathologic: Stage IIA (pT2, pN0, cM0, G3, ER+, PR-, HER2+) - Signed by [name], MD on 07/17/2019   08/10/2019 - 11/25/2019 Chemotherapy   The patient had dexamethasone (DECADRON) 4 MG tablet, 4 mg (100 % of original dose 4 mg), Oral, Daily, 1 of 1 cycle, Start date: 07/17/2019, End date: 07/19/2019 Dose modification: 4 mg (original dose 4 mg, Cycle 0) palonosetron (ALOXI) injection 0.25 mg, 0.25 mg, Intravenous,  Once, 6 of 6 cycles Administration: 0.25 mg (08/10/2019), 0.25 mg (08/31/2019), 0.25 mg (11/02/2019), 0.25 mg (11/23/2019), 0.25 mg (09/21/2019), 0.25 mg (10/12/2019) pegfilgrastim-jmdb (FULPHILA) injection 6 mg, 6 mg, Subcutaneous,  Once, 6 of 6 cycles Administration: 6 mg (08/12/2019), 6 mg (09/02/2019), 6 mg (11/04/2019), 6 mg (11/25/2019), 6 mg (09/23/2019), 6 mg (10/14/2019) CARBOplatin (PARAPLATIN) 600 mg in sodium chloride 0.9 % 250 mL chemo infusion, 600 mg (99.3 % of original dose 604.2 mg), Intravenous,  Once, 6 of 6 cycles Dose modification:   (original dose 604.2 mg, Cycle 1), 600 mg (original dose 604.2 mg, Cycle 1),   (original dose 604.2 mg, Cycle 2, Reason: Provider Judgment) Administration: 600 mg (08/10/2019), 500 mg (08/31/2019), 500 mg (11/02/2019), 500 mg (11/23/2019), 500 mg (09/21/2019), 500 mg (10/12/2019) DOCEtaxel (TAXOTERE) 140 mg in sodium chloride 0.9 % 250 mL chemo infusion, 65 mg/m2 = 140 mg (86.7 % of original dose 75 mg/m2), Intravenous,  Once, 6 of 6 cycles Dose modification: 65 mg/m2 (original dose 75 mg/m2, Cycle 1, Reason: Provider Judgment), 50 mg/m2 (original dose 75 mg/m2, Cycle 2,  Reason: Provider Judgment), 60 mg/m2 (original dose 75 mg/m2, Cycle 2, Reason: Dose not tolerated) Administration: 140 mg (08/10/2019), 130 mg (08/31/2019), 130 mg (11/02/2019), 130 mg (11/23/2019), 130 mg (09/21/2019), 130 mg (10/12/2019) pertuzumab (PERJETA) 420 mg in sodium chloride 0.9 % 250 mL chemo infusion, 420 mg (50 % of original dose 840 mg), Intravenous, Once, 6 of 6 cycles Dose modification: 420 mg (original dose 840 mg, Cycle 1, Reason: Provider Judgment) Administration: 420 mg (08/10/2019), 420 mg (08/31/2019), 420 mg (11/02/2019), 420 mg (11/23/2019), 420 mg (09/21/2019), 420 mg (10/12/2019) fosaprepitant (EMEND) 150 mg, dexamethasone (DECADRON) 12 mg in sodium chloride 0.9 % 145 mL IVPB, , Intravenous,  Once, 6 of 6 cycles Administration:  (08/10/2019),  (08/31/2019),  (11/02/2019),  (11/23/2019),  (09/21/2019),  (10/12/2019) trastuzumab-dkst (OGIVRI) 750 mg in sodium chloride 0.9 % 250 mL chemo infusion, 714 mg, Intravenous,  Once, 6 of 6 cycles Administration: 750 mg (08/10/2019), 600 mg (08/31/2019), 546 mg (11/02/2019), 546 mg (11/23/2019), 600 mg (09/21/2019), 546 mg (10/12/2019)   for chemotherapy treatment.     12/14/2019 - 08/02/2020 Chemotherapy   Patient is on Treatment Plan : BREAST Trastuzumab  / Pertuzumab (PERJETA) q21d x 13 cycles        CHIEF COMPLIANT: Follow-up of right breast cancer   INTERVAL HISTORY: Marisa Spears is a 70 y.o. with above-mentioned history of HER-2 positive right breast cancer who underwent a mastectomy, adjuvant chemotherapy, Herceptin Perjeta maintenance, and is currently on antiestrogen therapy with letrozole. She presents to the clinic today for follow-up.  Recently she had back surgery and is wearing her back brace.  She is healing and recovering very well from the fusion of 3 vertebrae.  Neuropathy has resolved when she stopped gabapentin.  Denies any lumps or nodules in the chest wall or axilla.  ALLERGIES:  is allergic to codeine.  MEDICATIONS:   Current Outpatient Medications  Medication Sig Dispense Refill   aspirin EC 81 MG tablet Take 81 mg by mouth at bedtime.      Biotin w/ Vitamins C & E (HAIR/SKIN/NAILS PO) Take 2 tablets by mouth daily.     Calcium Carb-Cholecalciferol (CALCIUM 600+D3 PO) Take 1 tablet by mouth daily.     Cholecalciferol (EQL VITAMIN D3) 50 MCG (2000 UT) CAPS Take 4,000 Units by mouth daily.     gabapentin (NEURONTIN) 300 MG capsule Take 1 capsule (300 mg total) by mouth 3 (three) times daily. 270 capsule 0   letrozole (FEMARA) 2.5 MG tablet Take 1 tablet (2.5 mg total) by mouth daily. 90 tablet 3   Omega-3 Fatty Acids (FISH OIL ULTRA) 1400 MG CAPS Take 1,400 mg by mouth 2 (two) times daily.     pravastatin (PRAVACHOL) 40 MG tablet Take 40 mg by mouth at bedtime.      triamterene-hydrochlorothiazide (MAXZIDE-25) 37.5-25 MG tablet Take 1 tablet by mouth daily.      venlafaxine XR (EFFEXOR-XR) 37.5 MG 24 hr capsule TAKE 1 CAPSULE BY MOUTH ONCE DAILY WITH BREAKFAST 90 capsule 3   No current facility-administered medications for this visit.    PHYSICAL EXAMINATION: ECOG PERFORMANCE STATUS: 1 - Symptomatic but completely ambulatory  Vitals:   08/29/21 0931  BP: 137/77  Pulse: (!) 57  Resp: 18  Temp: (!) 97.5 F (36.4 C)  SpO2: 98%   Filed Weights   08/29/21 0931  Weight: 208  lb 12.8 oz (94.7 kg)    BREAST: No palpable lumps or nodules in bilateral chest wall or axilla exam performed in the presence of a chaperone)  LABORATORY DATA:  I have reviewed the data as listed CMP Latest Ref Rng & Units 09/13/2020 07/12/2020 05/31/2020  Glucose 70 - 99 mg/dL 127(H) 124(H) 100(H)  BUN 8 - 23 mg/dL '15 12 9  ' Creatinine 0.44 - 1.00 mg/dL 0.71 0.79 0.79  Sodium 135 - 145 mmol/L 139 139 140  Potassium 3.5 - 5.1 mmol/L 4.5 4.0 3.8  Chloride 98 - 111 mmol/L 102 103 105  CO2 22 - 32 mmol/L '25 27 26  ' Calcium 8.9 - 10.3 mg/dL 9.8 10.0 9.9  Total Protein 6.5 - 8.1 g/dL - 7.3 7.3  Total Bilirubin 0.3 - 1.2 mg/dL -  0.7 0.7  Alkaline Phos 38 - 126 U/L - 70 68  AST 15 - 41 U/L - 18 23  ALT 0 - 44 U/L - 14 16    Lab Results  Component Value Date   WBC 6.8 07/12/2020   HGB 12.9 07/12/2020   HCT 38.1 07/12/2020   MCV 90.1 07/12/2020   PLT 203 07/12/2020   NEUTROABS 4.6 07/12/2020    ASSESSMENT & PLAN:  Malignant neoplasm of upper-outer quadrant of right breast in female, estrogen receptor positive ([place]) 05/22/2019:Patient palpated a lump in the right breast. Mammogram showed 2.2cm mass in the UOQ, with no right axillary adenopathy. 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  Stage IIa   Treatment plan: 1.  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 Stage 2A 2.  Adjuvant chemotherapy with TCH Perjeta x6  started 08/10/2019 completed 11/23/2019 followed by Herceptin Perjeta  completed 08/02/2020 3.  Adjuvant antiestrogen therapy with anastrozole 1 mg daily x7 years S1714: Taxane neuropathy study: No adverse effects due to participation in the study -------------------------------------------------------------------------------------------------------------------------- Treatment plan:anastrozole (to be started 12/14/2019), switched to letrozole 10/26/20 (Profound itching between the elbow and the shoulder improved with stopping anastrozole)    Anxiety and depression: On Effexor Chemo-induced peripheral neuropathy: Resolved and she is off gabapentin.  Letrozole toxicities: 1.  Joint stiffness and achiness: much improved 2. bone density July 2022: T score -2.5: Osteoporosis: Currently on Fosamax with calcium and vitamin D.   Breast cancer surveillance:  No need of mammograms since she had bilateral mastectomies. Breast exam: 08/29/2021:  Benign  Recent back surgery fusion of multiple vertebrae and for spinal stenosis: She is going to physical therapy and has a back brace and is healing and recovering very well.  Return to clinic in 1 year for follow-up    No orders of the defined types were placed in this encounter.  The patient has a good understanding of the overall plan. she agrees with it. she will call with any problems that may develop before the next visit here.  Total time spent: 20 mins including face to face time and time spent for planning, charting and coordination of care  Rulon Eisenmenger, MD, MPH 08/29/2021  I, Thana Ates, am acting as scribe for Dr. [name].  I have reviewed the above documentation for accuracy and completeness, and I agree with the above.

## 2021-08-29 ENCOUNTER — Other Ambulatory Visit: Payer: Self-pay

## 2021-08-29 ENCOUNTER — Inpatient Hospital Stay: Payer: Medicare Other | Attending: Hematology and Oncology | Admitting: Hematology and Oncology

## 2021-08-29 DIAGNOSIS — Z9013 Acquired absence of bilateral breasts and nipples: Secondary | ICD-10-CM | POA: Diagnosis not present

## 2021-08-29 DIAGNOSIS — M810 Age-related osteoporosis without current pathological fracture: Secondary | ICD-10-CM | POA: Diagnosis not present

## 2021-08-29 DIAGNOSIS — Z79811 Long term (current) use of aromatase inhibitors: Secondary | ICD-10-CM | POA: Diagnosis not present

## 2021-08-29 DIAGNOSIS — Z170 Estrogen receptor positive status [ER+]: Secondary | ICD-10-CM | POA: Insufficient documentation

## 2021-08-29 DIAGNOSIS — Z79899 Other long term (current) drug therapy: Secondary | ICD-10-CM | POA: Diagnosis not present

## 2021-08-29 DIAGNOSIS — C773 Secondary and unspecified malignant neoplasm of axilla and upper limb lymph nodes: Secondary | ICD-10-CM | POA: Diagnosis not present

## 2021-08-29 DIAGNOSIS — C50411 Malignant neoplasm of upper-outer quadrant of right female breast: Secondary | ICD-10-CM | POA: Diagnosis present

## 2021-08-29 MED ORDER — LETROZOLE 2.5 MG PO TABS
2.5000 mg | ORAL_TABLET | Freq: Every day | ORAL | 3 refills | Status: DC
Start: 2021-08-29 — End: 2022-08-28

## 2021-08-29 NOTE — Assessment & Plan Note (Signed)
05/22/2019:Patient palpated a lump in the right breast. Mammogram showed 2.2cm mass in the UOQ, with no right axillary adenopathy. 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 Stage 2A 2.Adjuvant chemotherapy with [place] Perjetax6 started 9/14/2020completed 12/28/2020followed by Herceptin Perjeta completed 08/02/2020 3.Adjuvant antiestrogen therapy with anastrozole 1 mg daily x7 years S1714: Taxane neuropathy study: No adverse effects due to participation in the study -------------------------------------------------------------------------------------------------------------------------- Treatment plan:anastrozole (to be started 12/14/2019), switched to letrozole 10/26/20 (Profound itching between the elbow and the shoulderimproved with stopping anastrozole)  Anxiety and depression:On Effexor 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  Letrozole toxicities: 1.  Joint stiffness and achiness: I discussed with her that as she loses weight, the stiffness will improve.  She is currently on weight watchers started last week and she lost already 4 pounds. 2. bone density will be done by her primary care physician later this year.  Breast cancer surveillance:  Mammogram will need to be obtained on the left breast. Breast exam: 08/29/2021: Benign  Return to clinic in 1 year for follow-up

## 2021-10-03 ENCOUNTER — Other Ambulatory Visit: Payer: Self-pay | Admitting: Hematology and Oncology

## 2022-08-06 ENCOUNTER — Telehealth: Payer: Self-pay | Admitting: Hematology and Oncology

## 2022-08-06 NOTE — Telephone Encounter (Signed)
Rescheduled appointment per provider [facility]. Patient is aware of the changes made to her upcoming appointment. 

## 2022-08-28 ENCOUNTER — Other Ambulatory Visit: Payer: Self-pay | Admitting: Hematology and Oncology

## 2022-08-29 ENCOUNTER — Ambulatory Visit: Payer: Medicare Other | Admitting: Hematology and Oncology

## 2022-08-29 NOTE — Progress Notes (Signed)
Patient Care Team: Earney Mallet, MD as PCP - General (Family Medicine) [name], RN as Oncology Nurse Navigator Rockwell Germany, RN as Oncology Nurse Navigator [name], MD as Consulting Physician (Hematology and Oncology) [name], MD as Consulting Physician (General Surgery) [name], MD as Attending Physician (Radiation Oncology)  DIAGNOSIS:  Encounter Diagnosis  Name Primary?   Malignant neoplasm of upper-outer quadrant of right breast in female, estrogen receptor positive ([place])     SUMMARY OF ONCOLOGIC HISTORY: Oncology History  Malignant neoplasm of upper-outer quadrant of right breast in female, estrogen receptor positive ([place])  1992 Miscellaneous   Left mastectomy followed by adjuvant chemotherapy   05/22/2019 Initial Diagnosis   Patient palpated a lump in the right breast. Mammogram showed 2.2cm mass in the UOQ, with no right axillary adenopathy. 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 The Common Hereditary Cancers Panel offered by [facility] includes sequencing and/or deletion duplication testing of the following 48 genes: APC, ATM, AXIN2, BARD1, BMPR1A, BRCA1, BRCA2, BRIP1, CDH1, CDKN2A (p14ARF), CDKN2A (p16INK4a), CKD4, CHEK2, CTNNA1, DICER1, EPCAM (Deletion/duplication testing only), GREM1 (promoter region deletion/duplication testing only), KIT, MEN1, MLH1, MSH2, MSH3, MSH6, MUTYH, NBN, NF1, NHTL1, PALB2, PDGFRA, PMS2, POLD1, POLE, PTEN, RAD50, RAD51C, RAD51D, RNF43, SDHB, SDHC, SDHD, SMAD4, SMARCA4. STK11, TP53, TSC1,  TSC2, and VHL.  The following genes were evaluated for sequence changes only: SDHA and HOXB13 c.251G>A variant only. The report date is 06/18/2019.   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   07/17/2019 Cancer Staging   Staging form: Breast, AJCC 8th Edition - Pathologic: Stage IIA (pT2, pN0, cM0, G3, ER+, PR-, HER2+) - Signed by [name], MD on 07/17/2019   08/10/2019 - 11/25/2019 Chemotherapy   The patient had dexamethasone (DECADRON) 4 MG tablet, 4 mg (100 % of original dose 4 mg), Oral, Daily, 1 of 1 cycle, Start date: 07/17/2019, End date: 07/19/2019 Dose modification: 4 mg (original dose 4 mg, Cycle 0) palonosetron (ALOXI) injection 0.25 mg, 0.25 mg, Intravenous,  Once, 6 of 6 cycles Administration: 0.25 mg (08/10/2019), 0.25 mg (08/31/2019), 0.25 mg (11/02/2019), 0.25 mg (11/23/2019), 0.25 mg (09/21/2019), 0.25 mg (10/12/2019) pegfilgrastim-jmdb (FULPHILA) injection 6 mg, 6 mg, Subcutaneous,  Once, 6 of 6 cycles Administration: 6 mg (08/12/2019), 6 mg (09/02/2019), 6 mg (11/04/2019), 6 mg (11/25/2019), 6 mg (09/23/2019), 6 mg (10/14/2019) CARBOplatin (PARAPLATIN) 600 mg in sodium chloride 0.9 % 250 mL chemo infusion, 600 mg (99.3 % of original dose 604.2 mg), Intravenous,  Once, 6 of 6 cycles Dose modification:   (original dose 604.2 mg, Cycle 1), 600 mg (original dose 604.2 mg, Cycle 1),   (original dose 604.2 mg, Cycle 2, Reason: Provider Judgment) Administration: 600 mg (08/10/2019), 500 mg (08/31/2019), 500 mg (11/02/2019), 500 mg (11/23/2019), 500 mg (09/21/2019), 500 mg (10/12/2019) DOCEtaxel (TAXOTERE) 140 mg in sodium chloride 0.9 % 250 mL chemo infusion, 65 mg/m2 = 140 mg (86.7 % of original dose 75 mg/m2), Intravenous,  Once, 6 of 6 cycles Dose modification:  65 mg/m2 (original dose 75 mg/m2, Cycle 1, Reason: Provider Judgment), 50 mg/m2 (original dose 75 mg/m2, Cycle 2,  Reason: Provider Judgment), 60 mg/m2 (original dose 75 mg/m2, Cycle 2, Reason: Dose not tolerated) Administration: 140 mg (08/10/2019), 130 mg (08/31/2019), 130 mg (11/02/2019), 130 mg (11/23/2019), 130 mg (09/21/2019), 130 mg (10/12/2019) pertuzumab (PERJETA) 420 mg in sodium chloride 0.9 % 250 mL chemo infusion, 420 mg (50 % of original dose 840 mg), Intravenous, Once, 6 of 6 cycles Dose modification: 420 mg (original dose 840 mg, Cycle 1, Reason: Provider Judgment) Administration: 420 mg (08/10/2019), 420 mg (08/31/2019), 420 mg (11/02/2019), 420 mg (11/23/2019), 420 mg (09/21/2019), 420 mg (10/12/2019) fosaprepitant (EMEND) 150 mg, dexamethasone (DECADRON) 12 mg in sodium chloride 0.9 % 145 mL IVPB, , Intravenous,  Once, 6 of 6 cycles Administration:  (08/10/2019),  (08/31/2019),  (11/02/2019),  (11/23/2019),  (09/21/2019),  (10/12/2019) trastuzumab-dkst (OGIVRI) 750 mg in sodium chloride 0.9 % 250 mL chemo infusion, 714 mg, Intravenous,  Once, 6 of 6 cycles Administration: 750 mg (08/10/2019), 600 mg (08/31/2019), 546 mg (11/02/2019), 546 mg (11/23/2019), 600 mg (09/21/2019), 546 mg (10/12/2019)  for chemotherapy treatment.    12/14/2019 - 08/02/2020 Chemotherapy   Patient is on Treatment Plan : BREAST Trastuzumab  / Pertuzumab (PERJETA) q21d x 13 cycles        CHIEF COMPLIANT: Follow-up of right breast cancer on letrozole  INTERVAL HISTORY: Marisa Spears is a 71 y.o. with above-mentioned history of HER-2 positive right breast cancer who underwent a mastectomy, adjuvant chemotherapy, Herceptin Perjeta maintenance, and is currently on antiestrogen therapy with letrozole. She presents to the clinic today for follow-up. She reports that her back has been hurting since she went to the beach and fell. She states that she went to her surgeon and had an injection done and it relieved it. She is better as of today. She is able to do some of her normal activities. She does have hot flashes when the weather changes it  gets worst. She denies any pan or discomfort in breast.   ALLERGIES:  is allergic to codeine.  MEDICATIONS:  Current Outpatient Medications  Medication Sig Dispense Refill   alendronate (FOSAMAX) 70 MG tablet Take 70 mg by mouth once a week.     aspirin EC 81 MG tablet Take 81 mg by mouth at bedtime.      atorvastatin (LIPITOR) 40 MG tablet Take 40 mg by mouth daily.     Biotin w/ Vitamins C & E (HAIR/SKIN/NAILS PO) Take 2 tablets by mouth daily.     Calcium Carb-Cholecalciferol (CALCIUM 600+D3 PO) Take 1 tablet by mouth daily.     Cholecalciferol (EQL VITAMIN D3) 50 MCG (2000 UT) CAPS Take 4,000 Units by mouth daily.     letrozole (FEMARA) 2.5 MG tablet Take 1 tablet (2.5 mg total) by mouth daily. 90 tablet 3   Omega-3 Fatty Acids (FISH OIL ULTRA) 1400 MG CAPS Take 1,400 mg by mouth 2 (two) times daily.     triamterene-hydrochlorothiazide (MAXZIDE-25) 37.5-25 MG tablet Take 1 tablet by mouth daily.      venlafaxine XR (EFFEXOR-XR) 37.5 MG 24 hr capsule TAKE 1 CAPSULE BY MOUTH EVERY DAY WITH BREAKFAST 90 capsule 3   No current facility-administered medications for this visit.    PHYSICAL EXAMINATION: ECOG PERFORMANCE STATUS: 1 - Symptomatic but completely ambulatory  Vitals:   09/03/22 0940  BP: (!) 132/91  Pulse: 65  Resp: 18  Temp: (!) 97.3 F (36.3 C)  SpO2: 97%     Filed Weights   09/03/22 0940  Weight: 221 lb 6.4 oz (100.4 kg)    BREAST: No palpable lumps or nodules in bilateral chest walls or axilla (exam performed in the presence of a chaperone)  LABORATORY DATA:  I have reviewed the data as listed    Latest Ref Rng & Units 09/13/2020   12:21 PM 07/12/2020   11:14 AM 05/31/2020   10:30 AM  CMP  Glucose 70 - 99 mg/dL 127  124  100   BUN 8 - 23 mg/dL 15  12  9   Creatinine 0.44 - 1.00 mg/dL 0.71  0.79  0.79   Sodium 135 - 145 mmol/L 139  139  140   Potassium 3.5 - 5.1 mmol/L 4.5  4.0  3.8   Chloride 98 - 111 mmol/L 102  103  105   CO2 22 - 32 mmol/L 25  27  26    Calcium 8.9 - 10.3 mg/dL 9.8  10.0  9.9   Total Protein 6.5 - 8.1 g/dL  7.3  7.3   Total Bilirubin 0.3 - 1.2 mg/dL  0.7  0.7   Alkaline Phos 38 - 126 U/L  70  68   AST 15 - 41 U/L  18  23   ALT 0 - 44 U/L  14  16     Lab Results  Component Value Date   WBC 6.8 07/12/2020   HGB 12.9 07/12/2020   HCT 38.1 07/12/2020   MCV 90.1 07/12/2020   PLT 203 07/12/2020   NEUTROABS 4.6 07/12/2020    ASSESSMENT & PLAN:  Malignant neoplasm of upper-outer quadrant of right breast in female, estrogen receptor positive ([facility]) 05/22/2019:Patient palpated a lump in the right breast. Mammogram showed 2.2cm mass in the UOQ, with no right axillary adenopathy. 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  Stage IIa   Treatment plan: 1.  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 Stage 2A 2.  Adjuvant chemotherapy with TCH Perjeta x6  started 08/10/2019 completed 11/23/2019 followed by Herceptin Perjeta  completed 08/02/2020 3.  Adjuvant antiestrogen therapy with anastrozole 1 mg daily x7 years S1714: Taxane neuropathy study: No adverse effects due to participation in the study -------------------------------------------------------------------------------------------------------------------------- Treatment plan:anastrozole (to be started 12/14/2019), switched to letrozole 10/26/20 (Profound itching between the elbow and the shoulder improved with stopping anastrozole)    Anxiety and depression: On Effexor Chemo-induced peripheral neuropathy: Resolved  Letrozole toxicities: 1.  Joint stiffness and achiness: much improved 2. bone density July 2022: T score -2.5: Osteoporosis: Currently on Fosamax with calcium and vitamin D.   Back issues: After  recent fall during the summer when she was at the beach.  Since she received an injection to the back she is feeling significantly better.  Breast cancer surveillance:  No need of mammograms since she had bilateral mastectomies. Breast exam: 09/03/2022: Benign   Return to clinic in 1 year for follow-up    No orders of the defined types were placed in this encounter.  The patient has a good understanding of the overall plan. she agrees with it. she will call with any problems that may develop before the next visit here. Total time spent: 30 mins including face to face time and time spent for planning, charting and co-ordination of care     Harriette Ohara, MD 09/03/22    I Gardiner Coins am scribing for Dr. Lindi Adie  I have reviewed the above documentation for accuracy and completeness, and I agree with the above.

## 2022-09-03 ENCOUNTER — Inpatient Hospital Stay: Payer: Medicare Other | Attending: Hematology and Oncology | Admitting: Hematology and Oncology

## 2022-09-03 DIAGNOSIS — Z79899 Other long term (current) drug therapy: Secondary | ICD-10-CM | POA: Insufficient documentation

## 2022-09-03 DIAGNOSIS — Z170 Estrogen receptor positive status [ER+]: Secondary | ICD-10-CM | POA: Diagnosis not present

## 2022-09-03 DIAGNOSIS — Z79811 Long term (current) use of aromatase inhibitors: Secondary | ICD-10-CM | POA: Insufficient documentation

## 2022-09-03 DIAGNOSIS — C50411 Malignant neoplasm of upper-outer quadrant of right female breast: Secondary | ICD-10-CM | POA: Diagnosis present

## 2022-09-03 MED ORDER — VENLAFAXINE HCL ER 37.5 MG PO CP24: ORAL_CAPSULE | ORAL | 3 refills | Status: DC

## 2022-09-03 MED ORDER — LETROZOLE 2.5 MG PO TABS: 2.5000 mg | ORAL_TABLET | Freq: Every day | ORAL | 3 refills | Status: DC

## 2022-09-03 NOTE — Assessment & Plan Note (Addendum)
05/22/2019:Patient palpated a lump in the right breast. Mammogram showed 2.2cm mass in the UOQ, with no right axillary adenopathy. 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 Stage 2A 2.Adjuvant chemotherapy with [place] Perjetax6 started 9/14/2020completed 12/28/2020followed by Herceptin Perjeta completed 08/02/2020 3.Adjuvant antiestrogen therapy with anastrozole 1 mg daily x7 years [id number]: Taxane neuropathy study: No adverse effects due to participation in the study -------------------------------------------------------------------------------------------------------------------------- Treatment plan:anastrozole (to be started 12/14/2019),switched to letrozole 10/26/20 (Profound itching between the elbow and the shoulderimproved with stopping anastrozole)  Anxiety and depression:On Effexor Chemo-induced peripheral neuropathy:Resolved  Letrozole toxicities: 1.Joint stiffness and achiness: much improved 2.bone density July 2022: T score -2.5: Osteoporosis: Currently on Fosamax with calcium and vitamin D.  Back issues: After recent fall during the summer when she was at the beach.  Since she received an injection to the back she is feeling significantly better.  Breast cancer surveillance:  No need of mammograms since she had bilateral mastectomies. Breast exam: 09/03/2022: Benign  Return to clinic in 1 year for follow-up

## 2023-06-03 ENCOUNTER — Telehealth: Payer: Self-pay

## 2023-06-03 ENCOUNTER — Telehealth: Payer: Self-pay | Admitting: Hematology and Oncology

## 2023-06-03 NOTE — Telephone Encounter (Signed)
Patient requesting appointment, patient is aware of upcoming appointment times/dates

## 2023-06-03 NOTE — Telephone Encounter (Signed)
Pt called and states she was seen in [place]  where she hada a PET scan which revealed lung nodule. 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 Pt states she would like to see Dr Pamelia Hoit as he has treated her in the past for BRCA. Advised pt she would need to see Dr Arbutus Ped as Dr Pamelia Hoit does not treat small cell lung ca. She would need to be seen immediately.  Pt reports she was given a referral to an oncologist in [place] for next week and would like to discuss with her husband if they will move forward with seeing that Dr or if they would like to return to [facility] to see Dr Arbutus Ped. Pt will call back today after she speaks with her husband.

## 2023-06-03 NOTE — Telephone Encounter (Signed)
Pt called back and states since she will not be seeing Dr Pamelia Hoit for [facility] she would like to stay in [place] with an oncologist there as she will have rxt and chemo. She feels this would be difficult for her.   She will call us should she have any concerns.

## 2023-06-04 ENCOUNTER — Inpatient Hospital Stay: Payer: Medicare Other | Admitting: Hematology and Oncology

## 2023-06-05 ENCOUNTER — Encounter: Payer: Self-pay | Admitting: Hematology and Oncology

## 2023-08-30 ENCOUNTER — Other Ambulatory Visit: Payer: Self-pay | Admitting: Hematology and Oncology

## 2023-09-02 ENCOUNTER — Encounter: Payer: Self-pay | Admitting: Hematology and Oncology

## 2023-09-02 ENCOUNTER — Telehealth: Payer: Self-pay | Admitting: Hematology and Oncology

## 2023-09-02 NOTE — Telephone Encounter (Signed)
Rescheduled 10/30 appointment per patient's requrst to November, patient is notified of appointment change.

## 2023-09-04 ENCOUNTER — Ambulatory Visit: Payer: Medicare Other | Admitting: Hematology and Oncology

## 2023-09-24 ENCOUNTER — Ambulatory Visit: Payer: Medicare Other | Admitting: Hematology and Oncology

## 2023-10-02 ENCOUNTER — Telehealth: Payer: Self-pay | Admitting: Hematology and Oncology

## 2023-10-02 NOTE — Telephone Encounter (Signed)
Left patient a message regarding changed appointment times on 10/07/2023 for follow up visit with Dr. Pamelia Hoit

## 2023-10-07 ENCOUNTER — Inpatient Hospital Stay: Payer: Medicare Other | Attending: Hematology and Oncology | Admitting: Hematology and Oncology

## 2023-10-07 VITALS — BP 123/76 | HR 92 | Temp 97.5°F | Resp 18 | Ht 69.0 in | Wt 198.3 lb

## 2023-10-07 DIAGNOSIS — Z170 Estrogen receptor positive status [ER+]: Secondary | ICD-10-CM

## 2023-10-07 DIAGNOSIS — Z79811 Long term (current) use of aromatase inhibitors: Secondary | ICD-10-CM | POA: Insufficient documentation

## 2023-10-07 DIAGNOSIS — C50411 Malignant neoplasm of upper-outer quadrant of right female breast: Secondary | ICD-10-CM | POA: Diagnosis not present

## 2023-10-07 MED ORDER — VENLAFAXINE HCL ER 75 MG PO CP24: ORAL_CAPSULE | ORAL | Status: AC

## 2023-10-07 MED ORDER — LETROZOLE 2.5 MG PO TABS: 2.5000 mg | ORAL_TABLET | Freq: Every day | ORAL | 3 refills | Status: AC

## 2023-10-07 NOTE — Progress Notes (Signed)
Patient Care Team: Alinda Deem, MD as PCP - General (Family Medicine) Pershing Proud, RN as Oncology Nurse Navigator Donnelly Angelica, RN as Oncology Nurse Navigator Serena Croissant, MD as Consulting Physician (Hematology and Oncology) Griselda Miner, MD as Consulting Physician (General Surgery) Lonie Peak, MD as Attending Physician (Radiation Oncology)  DIAGNOSIS:  Encounter Diagnosis  Name Primary?   Malignant neoplasm of upper-outer quadrant of right breast in female, estrogen receptor positive (HCC) Yes    SUMMARY OF ONCOLOGIC HISTORY: Oncology History  Malignant neoplasm of upper-outer quadrant of right breast in female, estrogen receptor positive (HCC)  1992 Miscellaneous   Left mastectomy followed by adjuvant chemotherapy   05/22/2019 Initial Diagnosis   Patient palpated a lump in the right breast. Mammogram showed 2.2cm mass in the UOQ, with no right axillary adenopathy. 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 The Common Hereditary Cancers Panel offered by [facility] includes sequencing and/or deletion duplication testing of the following 48 genes: APC, ATM, AXIN2, BARD1, BMPR1A, BRCA1, BRCA2, BRIP1, CDH1, CDKN2A (p14ARF), CDKN2A (p16INK4a), CKD4, CHEK2, CTNNA1, DICER1, EPCAM (Deletion/duplication testing only), GREM1 (promoter region deletion/duplication testing only), KIT, MEN1, MLH1, MSH2, MSH3, MSH6, MUTYH, NBN, NF1, NHTL1, PALB2, PDGFRA, PMS2, POLD1, POLE, PTEN, RAD50, RAD51C, RAD51D, RNF43, SDHB, SDHC, SDHD, SMAD4, SMARCA4. STK11, TP53, TSC1,  TSC2, and VHL.  The following genes were evaluated for sequence changes only: SDHA and HOXB13 c.251G>A variant only. The report date is 06/18/2019.   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   07/17/2019 Cancer Staging   Staging form: Breast, AJCC 8th Edition - Pathologic: Stage IIA (pT2, pN0, cM0, G3, ER+, PR-, HER2+) - Signed by Serena Croissant, MD on 07/17/2019   08/10/2019 - 11/25/2019 Chemotherapy   The patient had dexamethasone (DECADRON) 4 MG tablet, 4 mg (100 % of original dose 4 mg), Oral, Daily, 1 of 1 cycle, Start date: 07/17/2019, End date: 07/19/2019 Dose modification: 4 mg (original dose 4 mg, Cycle 0) palonosetron (ALOXI) injection 0.25 mg, 0.25 mg, Intravenous,  Once, 6 of 6 cycles Administration: 0.25 mg (08/10/2019), 0.25 mg (08/31/2019), 0.25 mg (11/02/2019), 0.25 mg (11/23/2019), 0.25 mg (09/21/2019), 0.25 mg (10/12/2019) pegfilgrastim-jmdb (FULPHILA) injection 6 mg, 6 mg, Subcutaneous,  Once, 6 of 6 cycles Administration: 6 mg (08/12/2019), 6 mg (09/02/2019), 6 mg (11/04/2019), 6 mg (11/25/2019), 6 mg (09/23/2019), 6 mg (10/14/2019) CARBOplatin (PARAPLATIN) 600 mg in sodium chloride 0.9 % 250 mL chemo infusion, 600 mg (99.3 % of original dose 604.2 mg), Intravenous,  Once, 6 of 6 cycles Dose modification:   (original dose 604.2 mg, Cycle 1), 600 mg (original dose 604.2 mg, Cycle 1),   (original dose 604.2 mg, Cycle 2, Reason: Provider Judgment) Administration: 600 mg (08/10/2019), 500 mg (08/31/2019), 500 mg (11/02/2019), 500 mg (11/23/2019), 500 mg (09/21/2019), 500 mg (10/12/2019) DOCEtaxel (TAXOTERE) 140 mg in sodium chloride 0.9 % 250 mL chemo infusion, 65 mg/m2 = 140 mg (86.7 % of original dose 75 mg/m2), Intravenous,  Once, 6 of 6 cycles Dose modification:  65 mg/m2 (original dose 75 mg/m2, Cycle 1, Reason: Provider Judgment), 50 mg/m2 (original dose 75 mg/m2, Cycle 2,  Reason: Provider Judgment), 60 mg/m2 (original dose 75 mg/m2, Cycle 2, Reason: Dose not tolerated) Administration: 140 mg (08/10/2019), 130 mg (08/31/2019), 130 mg (11/02/2019), 130 mg (11/23/2019), 130 mg (09/21/2019), 130 mg (10/12/2019) pertuzumab (PERJETA) 420 mg in sodium chloride 0.9 % 250 mL chemo infusion, 420 mg (50 % of original dose 840 mg), Intravenous, Once, 6 of 6 cycles Dose modification: 420 mg (original dose 840 mg, Cycle 1, Reason: Provider Judgment) Administration: 420 mg (08/10/2019), 420 mg (08/31/2019), 420 mg (11/02/2019), 420 mg (11/23/2019), 420 mg (09/21/2019), 420 mg (10/12/2019) fosaprepitant (EMEND) 150 mg, dexamethasone (DECADRON) 12 mg in sodium chloride 0.9 % 145 mL IVPB, , Intravenous,  Once, 6 of 6 cycles Administration:  (08/10/2019),  (08/31/2019),  (11/02/2019),  (11/23/2019),  (09/21/2019),  (10/12/2019) trastuzumab-dkst (OGIVRI) 750 mg in sodium chloride 0.9 % 250 mL chemo infusion, 714 mg, Intravenous,  Once, 6 of 6 cycles Administration: 750 mg (08/10/2019), 600 mg (08/31/2019), 546 mg (11/02/2019), 546 mg (11/23/2019), 600 mg (09/21/2019), 546 mg (10/12/2019)  for chemotherapy treatment.    12/14/2019 - 08/02/2020 Chemotherapy   Patient is on Treatment Plan : BREAST Trastuzumab  / Pertuzumab (PERJETA) q21d x 13 cycles        CHIEF COMPLIANT: Follow-up on letrozole therapy  HISTORY OF PRESENT ILLNESS:   History of Present Illness   The patient, with a history of breast cancer, presents after a recent diagnosis of stage 3 small cell lung cancer diagnosed and treated in [place] by Dr. Hulen Skains. The lung cancer was incidentally discovered during a lung scan for an aortic aneurysm. She denies any symptoms of lung cancer. She has completed thirty three radiation treatments and twelve chemotherapy treatments with carboplatin and etoposide. The treatments were challenging, requiring multiple blood transfusions, potassium, and fluids.  In addition to the lung cancer, she has a  history of breast cancer and is currently on letrozole. She has been advised to have her ovaries and tubes removed due to a genetic predisposition, but this is not urgent. She also has a history of anxiety, which is currently managed with venlafaxine 75mg .         ALLERGIES:  is allergic to codeine.  MEDICATIONS:  Current Outpatient Medications  Medication Sig Dispense Refill   alendronate (FOSAMAX) 70 MG tablet Take 70 mg by mouth once a week.     aspirin EC 81 MG tablet Take 81 mg by mouth at bedtime.      atorvastatin (LIPITOR) 40 MG tablet Take 40 mg by mouth daily.     Biotin w/ Vitamins C & E (HAIR/SKIN/NAILS PO) Take 2 tablets by mouth daily.     Calcium Carb-Cholecalciferol (CALCIUM 600+D3 PO) Take 1 tablet by mouth daily.     Cholecalciferol (EQL VITAMIN D3) 50 MCG (2000 UT) CAPS Take 4,000 Units by mouth daily.     letrozole (FEMARA) 2.5 MG tablet Take 1 tablet (2.5 mg total) by mouth daily. 90 tablet 3   Omega-3 Fatty Acids (FISH OIL ULTRA) 1400 MG CAPS Take 1,400 mg by mouth 2 (two) times daily.     triamterene-hydrochlorothiazide (MAXZIDE-25) 37.5-25 MG tablet Take 1 tablet by mouth daily.      venlafaxine XR (EFFEXOR-XR) 75 MG 24 hr capsule TAKE 1 CAPSULE BY MOUTH EVERY DAY WITH BREAKFAST     No current facility-administered medications for this visit.    PHYSICAL EXAMINATION: ECOG PERFORMANCE STATUS: 1 -  Symptomatic but completely ambulatory  Vitals:   10/07/23 1313  BP: 123/76  Pulse: 92  Resp: 18  Temp: (!) 97.5 F (36.4 C)  SpO2: 100%   Filed Weights   10/07/23 1313  Weight: 198 lb 4.8 oz (89.9 kg)     LABORATORY DATA:  I have reviewed the data as listed    Latest Ref Rng & Units 09/13/2020   12:21 PM 07/12/2020   11:14 AM 05/31/2020   10:30 AM  CMP  Glucose 70 - 99 mg/dL 295  621  308   BUN 8 - 23 mg/dL 15  12  9    Creatinine 0.44 - 1.00 mg/dL 6.57  8.46  9.62   Sodium 135 - 145 mmol/L 139  139  140   Potassium 3.5 - 5.1 mmol/L 4.5  4.0  3.8    Chloride 98 - 111 mmol/L 102  103  105   CO2 22 - 32 mmol/L 25  27  26    Calcium 8.9 - 10.3 mg/dL 9.8  95.2  9.9   Total Protein 6.5 - 8.1 g/dL  7.3  7.3   Total Bilirubin 0.3 - 1.2 mg/dL  0.7  0.7   Alkaline Phos 38 - 126 U/L  70  68   AST 15 - 41 U/L  18  23   ALT 0 - 44 U/L  14  16     Lab Results  Component Value Date   WBC 6.8 07/12/2020   HGB 12.9 07/12/2020   HCT 38.1 07/12/2020   MCV 90.1 07/12/2020   PLT 203 07/12/2020   NEUTROABS 4.6 07/12/2020    ASSESSMENT & PLAN:  Malignant neoplasm of upper-outer quadrant of right breast in female, estrogen receptor positive (HCC) 05/22/2019:Patient palpated a lump in the right breast. Mammogram showed 2.2cm mass in the UOQ, with no right axillary adenopathy. 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  Stage IIa   Treatment plan: 1.  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 Stage 2A 2.  Adjuvant chemotherapy with TCH Perjeta x6  started 08/10/2019 completed 11/23/2019 followed by Herceptin Perjeta  completed 08/02/2020 3.  Adjuvant antiestrogen therapy with anastrozole 1 mg daily x7 years S1714: Taxane neuropathy study: No adverse effects due to participation in the study -------------------------------------------------------------------------------------------------------------------------- Treatment plan:anastrozole (to be started 12/14/2019), switched to letrozole 10/26/20 (Profound itching between the elbow and the shoulder improved with stopping anastrozole)    Anxiety and depression: On Effexor Chemo-induced peripheral neuropathy: Resolved   Letrozole toxicities: 1.  Joint stiffness and achiness: much improved 2. bone density July 2022: T score -2.5:  Osteoporosis: Currently on Fosamax with calcium and vitamin D.   Back issues: After recent fall during the summer when she was at the beach.  Since she received an injection to the back she is feeling significantly better.   Breast cancer surveillance:  No need of mammograms since she had bilateral mastectomies. Breast exam: 10/07/2023: Benign   Return to clinic in 1 year for follow-up   ------------------------------------- Assessment and Plan    Stage 3 Lung Cancer Diagnosed in July 2024. Completed 33 radiation treatments and 12 chemo treatments (Taxol and Carboplatin). Awaiting brain MRI for possible preventive radiation. PET scan scheduled for mid-December. -Continue follow-up with oncology team in [place]. -Brain MRI on  10/09/2023 to assess need for preventive radiation. -PET scan in mid-December 2024.  Aortic Aneurysm Under surveillance by a thoracic surgeon. No current intervention mentioned. -Continue surveillance as directed by thoracic surgeon.  Breast Cancer (history) No current issues. Letrozole therapy ongoing. -Refill Letrozole for 1 year.  Anxiety Increased anxiety due to cancer treatments. Venlafaxine increased to 75mg  by primary care physician. -Continue Venlafaxine 75mg  as directed by primary care physician.  Ovarian Cancer Risk Genetic testing suggests increased risk. Plan for prophylactic removal of ovaries and tubes after completion of lung cancer treatment. -Plan for prophylactic oophorectomy after completion of lung cancer treatment.     Patient wishes to now follow-up in [facility] for her breast surveillance as well as for lung cancer surveillance. We are happy to see her on an as-needed basis.    No orders of the defined types were placed in this encounter.  The patient has a good understanding of the overall plan. she agrees with it. she will call with any problems that may develop before the next visit here. Total time spent: 30 mins including  face to face time and time spent for planning, charting and co-ordination of care   Tamsen Meek, MD 10/07/23

## 2023-10-07 NOTE — Assessment & Plan Note (Signed)
05/22/2019:Patient palpated a lump in the right breast. Mammogram showed 2.2cm mass in the UOQ, with no right axillary adenopathy. 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  Stage IIa   Treatment plan: 1.  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 Stage 2A 2.  Adjuvant chemotherapy with TCH Perjeta x6  started 08/10/2019 completed 11/23/2019 followed by Herceptin Perjeta  completed 08/02/2020 3.  Adjuvant antiestrogen therapy with anastrozole 1 mg daily x7 years [id number]: Taxane neuropathy study: No adverse effects due to participation in the study -------------------------------------------------------------------------------------------------------------------------- Treatment plan:anastrozole (to be started 12/14/2019), switched to letrozole 10/26/20 (Profound itching between the elbow and the shoulder improved with stopping anastrozole)    Anxiety and depression: On Effexor Chemo-induced peripheral neuropathy: Resolved   Letrozole toxicities: 1.  Joint stiffness and achiness: much improved 2. bone density July 2022: T score -2.5: Osteoporosis: Currently on Fosamax with calcium and vitamin D.   Back issues: After recent fall during the summer when she was at the beach.  Since she received an injection to the back she is feeling significantly better.   Breast cancer surveillance:  No need of mammograms since she had bilateral mastectomies. Breast exam: 10/07/2023: Benign   Return to clinic in 1 year for follow-up

## 2023-10-09 ENCOUNTER — Encounter: Payer: Self-pay | Admitting: Hematology & Oncology

## 2023-10-16 ENCOUNTER — Encounter: Payer: Self-pay | Admitting: Hematology and Oncology

## 2023-10-16 NOTE — Telephone Encounter (Signed)
Telephone call
